# Patient Record
Sex: Female | Born: 1935 | Race: White | Hispanic: No | Smoking: Never smoker
Health system: Southern US, Community
[De-identification: ages and names within clinical notes are randomized; demographics above are authoritative.]

## PROBLEM LIST (undated history)

## (undated) DIAGNOSIS — M48061 Spinal stenosis, lumbar region without neurogenic claudication: Secondary | ICD-10-CM

## (undated) DIAGNOSIS — R32 Unspecified urinary incontinence: Secondary | ICD-10-CM

## (undated) DIAGNOSIS — M1990 Unspecified osteoarthritis, unspecified site: Secondary | ICD-10-CM

## (undated) DIAGNOSIS — R413 Other amnesia: Secondary | ICD-10-CM

## (undated) DIAGNOSIS — D649 Anemia, unspecified: Secondary | ICD-10-CM

## (undated) DIAGNOSIS — G309 Alzheimer's disease, unspecified: Secondary | ICD-10-CM

## (undated) DIAGNOSIS — R269 Unspecified abnormalities of gait and mobility: Secondary | ICD-10-CM

## (undated) DIAGNOSIS — I69359 Hemiplegia and hemiparesis following cerebral infarction affecting unspecified side: Secondary | ICD-10-CM

## (undated) DIAGNOSIS — F0280 Dementia in other diseases classified elsewhere without behavioral disturbance: Secondary | ICD-10-CM

## (undated) DIAGNOSIS — I10 Essential (primary) hypertension: Secondary | ICD-10-CM

## (undated) DIAGNOSIS — M47817 Spondylosis without myelopathy or radiculopathy, lumbosacral region: Secondary | ICD-10-CM

## (undated) DIAGNOSIS — I69398 Other sequelae of cerebral infarction: Secondary | ICD-10-CM

## (undated) DIAGNOSIS — I639 Cerebral infarction, unspecified: Secondary | ICD-10-CM

## (undated) DIAGNOSIS — E785 Hyperlipidemia, unspecified: Secondary | ICD-10-CM

## (undated) HISTORY — DX: Essential (primary) hypertension: I10

## (undated) HISTORY — DX: Hyperlipidemia, unspecified: E78.5

## (undated) HISTORY — DX: Other amnesia: R41.3

## (undated) HISTORY — DX: Spondylosis without myelopathy or radiculopathy, lumbosacral region: M47.817

## (undated) HISTORY — DX: Other sequelae of cerebral infarction: I69.398

## (undated) HISTORY — DX: Cerebral infarction, unspecified: I63.9

## (undated) HISTORY — DX: Unspecified urinary incontinence: R32

## (undated) HISTORY — DX: Unspecified abnormalities of gait and mobility: R26.9

## (undated) HISTORY — DX: Hemiplegia and hemiparesis following cerebral infarction affecting unspecified side: I69.359

## (undated) HISTORY — PX: CATARACT EXTRACTION W/ INTRAOCULAR LENS  IMPLANT, BILATERAL: SHX1307

## (undated) HISTORY — PX: TUBAL LIGATION: SHX77

---

## 1998-12-18 DIAGNOSIS — I639 Cerebral infarction, unspecified: Secondary | ICD-10-CM

## 1998-12-18 HISTORY — DX: Cerebral infarction, unspecified: I63.9

## 2012-08-30 ENCOUNTER — Ambulatory Visit
Admission: RE | Admit: 2012-08-30 | Discharge: 2012-08-30 | Disposition: A | Discharge status: Home / Self Care | Payer: Medicare Other | Source: Ambulatory Visit | Attending: Family Medicine | Admitting: Family Medicine

## 2012-08-30 ENCOUNTER — Other Ambulatory Visit: Payer: Self-pay | Admitting: Family Medicine

## 2012-08-30 DIAGNOSIS — M25552 Pain in left hip: Secondary | ICD-10-CM

## 2012-08-30 DIAGNOSIS — R079 Chest pain, unspecified: Secondary | ICD-10-CM

## 2012-09-20 ENCOUNTER — Ambulatory Visit: Payer: Medicare Other | Attending: Family Medicine

## 2012-09-20 ENCOUNTER — Ambulatory Visit: Payer: Medicare Other | Admitting: Physical Therapy

## 2012-09-20 DIAGNOSIS — R262 Difficulty in walking, not elsewhere classified: Secondary | ICD-10-CM | POA: Insufficient documentation

## 2012-09-20 DIAGNOSIS — M6281 Muscle weakness (generalized): Secondary | ICD-10-CM | POA: Insufficient documentation

## 2012-09-20 DIAGNOSIS — IMO0001 Reserved for inherently not codable concepts without codable children: Secondary | ICD-10-CM | POA: Insufficient documentation

## 2012-09-20 DIAGNOSIS — R269 Unspecified abnormalities of gait and mobility: Secondary | ICD-10-CM | POA: Insufficient documentation

## 2012-09-20 DIAGNOSIS — Z9181 History of falling: Secondary | ICD-10-CM | POA: Insufficient documentation

## 2012-09-25 ENCOUNTER — Ambulatory Visit: Payer: Medicare Other | Admitting: Physical Therapy

## 2012-09-26 ENCOUNTER — Ambulatory Visit: Payer: Medicare Other

## 2012-10-02 ENCOUNTER — Ambulatory Visit: Payer: Medicare Other | Admitting: Physical Therapy

## 2012-10-04 ENCOUNTER — Ambulatory Visit: Payer: Medicare Other | Admitting: Physical Therapy

## 2012-10-09 ENCOUNTER — Ambulatory Visit: Payer: Medicare Other | Admitting: Physical Therapy

## 2012-10-12 ENCOUNTER — Ambulatory Visit: Payer: Medicare Other | Admitting: Physical Therapy

## 2012-10-15 ENCOUNTER — Ambulatory Visit: Payer: Medicare Other

## 2012-10-17 ENCOUNTER — Encounter: Payer: Self-pay | Admitting: Neurology

## 2012-10-17 ENCOUNTER — Ambulatory Visit (INDEPENDENT_AMBULATORY_CARE_PROVIDER_SITE_OTHER): Payer: Medicare Other | Admitting: Neurology

## 2012-10-17 ENCOUNTER — Ambulatory Visit: Payer: Medicare Other | Attending: Family Medicine

## 2012-10-17 VITALS — BP 126/63 | HR 72 | Ht 63.75 in | Wt 128.0 lb

## 2012-10-17 DIAGNOSIS — R413 Other amnesia: Secondary | ICD-10-CM

## 2012-10-17 DIAGNOSIS — R269 Unspecified abnormalities of gait and mobility: Secondary | ICD-10-CM | POA: Insufficient documentation

## 2012-10-17 DIAGNOSIS — Z9181 History of falling: Secondary | ICD-10-CM | POA: Insufficient documentation

## 2012-10-17 DIAGNOSIS — IMO0001 Reserved for inherently not codable concepts without codable children: Secondary | ICD-10-CM | POA: Insufficient documentation

## 2012-10-17 DIAGNOSIS — M6281 Muscle weakness (generalized): Secondary | ICD-10-CM | POA: Insufficient documentation

## 2012-10-17 DIAGNOSIS — R262 Difficulty in walking, not elsewhere classified: Secondary | ICD-10-CM | POA: Insufficient documentation

## 2012-10-17 HISTORY — DX: Other amnesia: R41.3

## 2012-10-17 HISTORY — DX: Unspecified abnormalities of gait and mobility: R26.9

## 2012-10-17 NOTE — Progress Notes (Signed)
Reason for visit: Memory disturbance  Tracey [name] is a 79 y.o. female  History of present illness:  Tracey [name] is an 79 year old right-handed white female with a history of a progressive memory disturbance over the last 12-18 months. The patient has been living in [place], but she has come down to live with her daughter in May of 2014. The daughter last visited her mother in November of 2013. At that time, she was noted to have some mild memory problems, and some difficulty with a wide-based shuffling type gait. The memory issues have worsened over time, and the gait disturbance has also become more prominent. The patient has undergone physical therapy in the [place] area, but this has offered minimal benefit. The patient also reports a long-standing history of bladder control problems and incontinence. It is not clear that this has worsened, but it has certainly remained a problem. The patient has had an occasional fall. The patient was told to use a cane for ambulation, but she does not wish to do so. The patient denies headaches, or neck pain. The patient does have some low back pain and left hip pain. The patient reports degenerative arthritis affecting the right knee which impairs her ability to walk as well. The patient was driving a car, handling her appointments, and finances without problems prior to her transition to [facility] in May of 2014. The patient has had blood work done to include a vitamin B12 level and a thyroid profile which were unremarkable. The patient was taken off of her statin medication. The patient has a history of cerebrovascular disease, and she has had some mild left-sided weakness for several years following a stroke.  Past Medical History  Diagnosis Date  . Memory deficit 10/17/2012  . Abnormality of gait 10/17/2012  . Stroke     Mild left hemiparesis  . Urinary incontinence   . Lumbosacral spondylosis   . Hypertension   . Dyslipidemia     Past Surgical  History  Procedure Laterality Date  . Tubal ligation Bilateral   . Cataract extraction Bilateral     Family History  Problem Relation Age of Onset  . Parkinsonism Brother   . Dementia Brother   . Heart disease Brother     Social history:  reports that she has never smoked. She does not have any smokeless tobacco history on file. She reports that  drinks alcohol. She reports that she does not use illicit drugs.  Medications:  No current outpatient prescriptions on file prior to visit.   No current facility-administered medications on file prior to visit.    Allergies: No Known Allergies  ROS:  Out of a complete 14 system review of symptoms, the patient complains only of the following symptoms, and all other reviewed systems are negative.  Constipation Joint pain, muscle cramps Memory loss, gait disturbance Urinary incontinence  Blood pressure 126/63, pulse 72, height 5' 3.75" (1.619 m), weight 128 lb (58.06 kg).  Physical Exam  General: The patient is alert and cooperative at the time of the examination.  Head: Pupils are equal, round, and reactive to light. Discs are flat bilaterally.  Neck: The neck is supple, no carotid bruits are noted.  Respiratory: The respiratory examination is clear.  Cardiovascular: The cardiovascular examination reveals a regular rate and rhythm, no obvious murmurs or rubs are noted.  Skin: Extremities are without significant edema.  Neurologic Exam  Mental status: Mini-Mental status examination done today shows a total score of 21/30. The patient is  able to name 9 animals in 60 seconds.  Cranial nerves: Facial symmetry is present. There is good sensation of the face to pinprick and soft touch bilaterally. The strength of the facial muscles and the muscles to head turning and shoulder shrug are normal bilaterally. Speech is well enunciated, no aphasia or dysarthria is noted. Extraocular movements are full. Visual fields are full.  Motor:  The motor testing reveals 5 over 5 strength of all 4 extremities. Good symmetric motor tone is noted throughout.  Sensory: Sensory testing is intact to pinprick, soft touch, vibration sensation, and position sense on all 4 extremities, with the exception that position sense is reduced in both feet. No evidence of extinction is noted.  Coordination: Cerebellar testing reveals good finger-nose-finger and heel-to-shin bilaterally.  Gait and station: Gait is wide-based, unsteady. Tandem gait is apraxic and unsteady. Romberg is negative. No drift is seen.  Reflexes: Deep tendon reflexes are symmetric and normal bilaterally. Toes are downgoing bilaterally.   Assessment/Plan:  1. Memory disturbance  2. Gait disturbance  3. Urinary incontinence  4. Cerebrovascular disease  The patient has had a significant change in her cognitive functioning level, and her ability to ambulate within the last 8 months. The patient will need to be evaluated for possible normal pressure hydrocephalus or cerebrovascular disease. The patient will undergo MRI evaluation of the brain. Further evaluation may be done upon the results of this study. The patient otherwise will followup in 3 or 4 months.  Marlan Palau MD 10/17/2012 9:11 PM  [facility] [address] [place], [place] [address]  Phone [phone] Fax [phone]

## 2012-10-22 ENCOUNTER — Telehealth: Payer: Self-pay | Admitting: Neurology

## 2012-10-22 ENCOUNTER — Ambulatory Visit: Payer: Medicare Other

## 2012-10-24 ENCOUNTER — Ambulatory Visit: Payer: Medicare Other

## 2012-10-24 ENCOUNTER — Ambulatory Visit
Admission: RE | Admit: 2012-10-24 | Discharge: 2012-10-24 | Disposition: A | Discharge status: Home / Self Care | Payer: Medicare Other | Source: Ambulatory Visit | Attending: Neurology | Admitting: Neurology

## 2012-10-24 DIAGNOSIS — R413 Other amnesia: Secondary | ICD-10-CM

## 2012-10-24 DIAGNOSIS — R269 Unspecified abnormalities of gait and mobility: Secondary | ICD-10-CM

## 2012-10-24 NOTE — Telephone Encounter (Signed)
I spoke to daughter and told her I would check with Luster Landsberg about when patient is scheduled.  Spoke with Renee and patient was scheduled today.

## 2012-10-25 ENCOUNTER — Telehealth: Payer: Self-pay | Admitting: Neurology

## 2012-10-25 MED ORDER — DONEPEZIL HCL 5 MG PO TABS: 5.0000 mg | ORAL_TABLET | Freq: Every day | ORAL | Status: DC

## 2012-10-25 NOTE — Telephone Encounter (Signed)
I spoke to the patient's daughter [name] numerous times about patient getting a MRI brain ASAP.  She feels that her mother is declining and is very concerned about her condition so she wanted MRI done right away and was willing to pay out of pocket.  The MRI was done on 10-24-12 and daughter is now calling again for results because she does not wait until after the weekend.  I will forward to doctor to contact daughter when results are ready.

## 2012-10-25 NOTE — Telephone Encounter (Signed)
I called the daughter. MRI the brain has not yet been read out, but by my reading, this appears to show severe atrophy and a moderate to severe level of small vessel ischemic changes. There is ventriculomegaly, but this appears to be hydrocephalus ex vacuo. If any differences in the final report are present, I will call the daughter back. We will get the patient started on Aricept and followup in about 3 months. I am not sure that the patient will be able to go back home and lives alone. The dementia issues likely to progress over time.

## 2012-10-25 NOTE — Telephone Encounter (Signed)
I spoke with daughter and she wanted to know if they should make an appointment for patient to come in and doctor to explain things to patient.  Also asked if there were senior resources she could turn to for help.  I told her [facility] might be a place to consider, but she should think about what she wants to do and let us know what she decides.

## 2012-11-13 ENCOUNTER — Ambulatory Visit: Payer: Medicare Other | Admitting: Nurse Practitioner

## 2014-04-24 ENCOUNTER — Inpatient Hospital Stay (HOSPITAL_COMMUNITY)
Admission: EM | Admit: 2014-04-24 | Discharge: 2014-04-25 | DRG: 065 | Disposition: A | Discharge status: Home / Self Care | Payer: Medicare Other | Attending: Internal Medicine | Admitting: Internal Medicine

## 2014-04-24 ENCOUNTER — Inpatient Hospital Stay (HOSPITAL_COMMUNITY): Payer: Medicare Other

## 2014-04-24 ENCOUNTER — Encounter (HOSPITAL_COMMUNITY): Payer: Self-pay

## 2014-04-24 ENCOUNTER — Emergency Department (HOSPITAL_COMMUNITY): Payer: Medicare Other

## 2014-04-24 DIAGNOSIS — R531 Weakness: Secondary | ICD-10-CM | POA: Diagnosis present

## 2014-04-24 DIAGNOSIS — I10 Essential (primary) hypertension: Secondary | ICD-10-CM | POA: Diagnosis present

## 2014-04-24 DIAGNOSIS — Z7982 Long term (current) use of aspirin: Secondary | ICD-10-CM

## 2014-04-24 DIAGNOSIS — Z66 Do not resuscitate: Secondary | ICD-10-CM | POA: Diagnosis present

## 2014-04-24 DIAGNOSIS — R269 Unspecified abnormalities of gait and mobility: Secondary | ICD-10-CM | POA: Diagnosis present

## 2014-04-24 DIAGNOSIS — F0280 Dementia in other diseases classified elsewhere without behavioral disturbance: Secondary | ICD-10-CM | POA: Diagnosis present

## 2014-04-24 DIAGNOSIS — G309 Alzheimer's disease, unspecified: Secondary | ICD-10-CM | POA: Diagnosis present

## 2014-04-24 DIAGNOSIS — R32 Unspecified urinary incontinence: Secondary | ICD-10-CM | POA: Diagnosis present

## 2014-04-24 DIAGNOSIS — I639 Cerebral infarction, unspecified: Principal | ICD-10-CM | POA: Diagnosis present

## 2014-04-24 DIAGNOSIS — R05 Cough: Secondary | ICD-10-CM

## 2014-04-24 DIAGNOSIS — I69354 Hemiplegia and hemiparesis following cerebral infarction affecting left non-dominant side: Secondary | ICD-10-CM | POA: Diagnosis not present

## 2014-04-24 DIAGNOSIS — R059 Cough, unspecified: Secondary | ICD-10-CM

## 2014-04-24 DIAGNOSIS — I6340 Cerebral infarction due to embolism of unspecified cerebral artery: Secondary | ICD-10-CM | POA: Insufficient documentation

## 2014-04-24 DIAGNOSIS — E785 Hyperlipidemia, unspecified: Secondary | ICD-10-CM | POA: Insufficient documentation

## 2014-04-24 DIAGNOSIS — G3183 Dementia with Lewy bodies: Secondary | ICD-10-CM

## 2014-04-24 DIAGNOSIS — M6289 Other specified disorders of muscle: Secondary | ICD-10-CM

## 2014-04-24 DIAGNOSIS — M47817 Spondylosis without myelopathy or radiculopathy, lumbosacral region: Secondary | ICD-10-CM | POA: Diagnosis present

## 2014-04-24 HISTORY — DX: Dementia in other diseases classified elsewhere, unspecified severity, without behavioral disturbance, psychotic disturbance, mood disturbance, and anxiety: F02.80

## 2014-04-24 HISTORY — DX: Alzheimer's disease, unspecified: G30.9

## 2014-04-24 LAB — URINALYSIS, ROUTINE W REFLEX MICROSCOPIC
Bilirubin Urine: NEGATIVE
Glucose, UA: NEGATIVE mg/dL
HGB URINE DIPSTICK: NEGATIVE
Ketones, ur: NEGATIVE mg/dL
LEUKOCYTES UA: NEGATIVE
NITRITE: NEGATIVE
PROTEIN: NEGATIVE mg/dL
Specific Gravity, Urine: 1.014 (ref 1.005–1.030)
UROBILINOGEN UA: 0.2 mg/dL (ref 0.0–1.0)
pH: 5.5 (ref 5.0–8.0)

## 2014-04-24 LAB — COMPREHENSIVE METABOLIC PANEL
ALT: 16 U/L (ref 0–35)
ANION GAP: 9 (ref 5–15)
AST: 25 U/L (ref 0–37)
Albumin: 3.9 g/dL (ref 3.5–5.2)
Alkaline Phosphatase: 57 U/L (ref 39–117)
BUN: 13 mg/dL (ref 6–23)
CALCIUM: 9.7 mg/dL (ref 8.4–10.5)
CHLORIDE: 105 meq/L (ref 96–112)
CO2: 25 mmol/L (ref 19–32)
Creatinine, Ser: 0.91 mg/dL (ref 0.50–1.10)
GFR, EST AFRICAN AMERICAN: 67 mL/min — AB (ref >90)
GFR, EST NON AFRICAN AMERICAN: 58 mL/min — AB (ref >90)
GLUCOSE: 131 mg/dL — AB (ref 70–99)
POTASSIUM: 4 mmol/L (ref 3.5–5.1)
SODIUM: 139 mmol/L (ref 135–145)
TOTAL PROTEIN: 6.1 g/dL (ref 6.0–8.3)
Total Bilirubin: 0.5 mg/dL (ref 0.3–1.2)

## 2014-04-24 LAB — CBC
HCT: 35.5 % — ABNORMAL LOW (ref 36.0–46.0)
Hemoglobin: 11.7 g/dL — ABNORMAL LOW (ref 12.0–15.0)
MCH: 32.2 pg (ref 26.0–34.0)
MCHC: 33 g/dL (ref 30.0–36.0)
MCV: 97.8 fL (ref 78.0–100.0)
PLATELETS: 246 10*3/uL (ref 150–400)
RBC: 3.63 MIL/uL — ABNORMAL LOW (ref 3.87–5.11)
RDW: 13.1 % (ref 11.5–15.5)
WBC: 6.5 10*3/uL (ref 4.0–10.5)

## 2014-04-24 LAB — DIFFERENTIAL
BASOS ABS: 0 10*3/uL (ref 0.0–0.1)
BASOS PCT: 0 % (ref 0–1)
Eosinophils Absolute: 0.3 10*3/uL (ref 0.0–0.7)
Eosinophils Relative: 4 % (ref 0–5)
Lymphocytes Relative: 16 % (ref 12–46)
Lymphs Abs: 1 10*3/uL (ref 0.7–4.0)
MONOS PCT: 8 % (ref 3–12)
Monocytes Absolute: 0.5 10*3/uL (ref 0.1–1.0)
NEUTROS ABS: 4.7 10*3/uL (ref 1.7–7.7)
NEUTROS PCT: 72 % (ref 43–77)

## 2014-04-24 LAB — I-STAT CHEM 8, ED
BUN: 15 mg/dL (ref 6–23)
CHLORIDE: 105 meq/L (ref 96–112)
Calcium, Ion: 1.21 mmol/L (ref 1.13–1.30)
Creatinine, Ser: 0.8 mg/dL (ref 0.50–1.10)
Glucose, Bld: 132 mg/dL — ABNORMAL HIGH (ref 70–99)
HEMATOCRIT: 38 % (ref 36.0–46.0)
HEMOGLOBIN: 12.9 g/dL (ref 12.0–15.0)
POTASSIUM: 3.9 mmol/L (ref 3.5–5.1)
SODIUM: 140 mmol/L (ref 135–145)
TCO2: 22 mmol/L (ref 0–100)

## 2014-04-24 LAB — ETHANOL: Alcohol, Ethyl (B): <5 mg/dL (ref 0–9)

## 2014-04-24 LAB — APTT: aPTT: 31 seconds (ref 24–37)

## 2014-04-24 LAB — PROTIME-INR
INR: 1.15 (ref 0.00–1.49)
PROTHROMBIN TIME: 14.9 s (ref 11.6–15.2)

## 2014-04-24 LAB — RAPID URINE DRUG SCREEN, HOSP PERFORMED
Amphetamines: NOT DETECTED
BARBITURATES: NOT DETECTED
Benzodiazepines: NOT DETECTED
COCAINE: NOT DETECTED
Opiates: POSITIVE — AB
Tetrahydrocannabinol: NOT DETECTED

## 2014-04-24 LAB — TSH: TSH: 0.6 u[IU]/mL (ref 0.350–4.500)

## 2014-04-24 LAB — I-STAT TROPONIN, ED: Troponin i, poc: 0 ng/mL (ref 0.00–0.08)

## 2014-04-24 MED ORDER — DONEPEZIL HCL 10 MG PO TABS
10.0000 mg | ORAL_TABLET | Freq: Every day | ORAL | Status: DC
  Administered 2014-04-25: 10 mg via ORAL
  Filled 2014-04-24: qty 1

## 2014-04-24 MED ORDER — OMEGA-3-ACID ETHYL ESTERS 1 G PO CAPS
1.0000 g | ORAL_CAPSULE | Freq: Every day | ORAL | Status: DC
  Administered 2014-04-25: 1 g via ORAL
  Filled 2014-04-24: qty 1

## 2014-04-24 MED ORDER — VITAMIN D 1000 UNITS PO TABS
1000.0000 [IU] | ORAL_TABLET | Freq: Every day | ORAL | Status: DC
  Administered 2014-04-25: 1000 [IU] via ORAL
  Filled 2014-04-24: qty 1

## 2014-04-24 MED ORDER — ENOXAPARIN SODIUM 40 MG/0.4ML ~~LOC~~ SOLN
40.0000 mg | SUBCUTANEOUS | Status: DC
Start: 2014-04-24 — End: 2014-04-25
  Administered 2014-04-24: 40 mg via SUBCUTANEOUS
  Filled 2014-04-24: qty 0.4

## 2014-04-24 MED ORDER — SODIUM CHLORIDE 0.9 % IV SOLN: INTRAVENOUS | Status: DC

## 2014-04-24 MED ORDER — ASPIRIN 325 MG PO TABS
325.0000 mg | ORAL_TABLET | Freq: Every day | ORAL | Status: DC
  Administered 2014-04-25: 325 mg via ORAL
  Filled 2014-04-24: qty 1

## 2014-04-24 MED ORDER — DICLOFENAC SODIUM 2 % TD SOLN: 2.0000 | Freq: Every day | TRANSDERMAL | Status: DC | PRN

## 2014-04-24 MED ORDER — CALCIUM CARBONATE-VITAMIN D 500-200 MG-UNIT PO TABS
1.0000 | ORAL_TABLET | Freq: Every day | ORAL | Status: DC
  Administered 2014-04-25: 1 via ORAL
  Filled 2014-04-24: qty 1

## 2014-04-24 MED ORDER — FISH OIL 1000 MG PO CAPS
1000.0000 mg | ORAL_CAPSULE | Freq: Every day | ORAL | Status: DC
  Filled 2014-04-24 (×2): qty 1

## 2014-04-24 MED ORDER — STROKE: EARLY STAGES OF RECOVERY BOOK
Freq: Once | Status: AC
  Administered 2014-04-24: 14:00:00
  Filled 2014-04-24: qty 1

## 2014-04-24 MED ORDER — POLYETHYLENE GLYCOL 3350 17 G PO PACK: 17.0000 g | PACK | Freq: Every day | ORAL | Status: DC | PRN

## 2014-04-24 MED ORDER — ACETAMINOPHEN-CODEINE #3 300-30 MG PO TABS: 1.0000 | ORAL_TABLET | Freq: Three times a day (TID) | ORAL | Status: DC | PRN

## 2014-04-24 MED ORDER — FLUOCINONIDE 0.05 % EX OINT
1.0000 "application " | TOPICAL_OINTMENT | Freq: Two times a day (BID) | CUTANEOUS | Status: DC
  Filled 2014-04-24: qty 15

## 2014-04-24 MED ORDER — SENNOSIDES-DOCUSATE SODIUM 8.6-50 MG PO TABS: 1.0000 | ORAL_TABLET | Freq: Every evening | ORAL | Status: DC | PRN

## 2014-04-24 MED ORDER — ASPIRIN 300 MG RE SUPP
300.0000 mg | Freq: Every day | RECTAL | Status: DC
  Administered 2014-04-24: 300 mg via RECTAL
  Filled 2014-04-24: qty 1

## 2014-04-24 NOTE — ED Notes (Signed)
Pt unable to void on bedpan

## 2014-04-24 NOTE — Progress Notes (Signed)
Report received from Ed, ED RN. Pt coming to 4N26. All questions answered.

## 2014-04-24 NOTE — ED Notes (Signed)
Patient transported to CT 

## 2014-04-24 NOTE — Consult Note (Signed)
Referring Physician: Regalado    Chief Complaint: possible stroke  HPI:                                                                                                                                         Tracey [name] is an 81 y.o. female comes from [facility]. At 3am pt slid down from her bed into the floor trying to go to the bathroom. Pt did not hit head. Staff assisted her back to bed. This morning the patient woke up and felt weak. Pt has hx of left sided deficit from cva several years ago but states that she feels much weaker the last few days on her left side than normal. Pt is not on blood thinners, just takes ASA. Patient has residual left sided weakness from previous stroke but feels this has increased.   Date last known well: Date: 04/24/2014 Time last known well: Time: 03:00 tPA Given: No: minimal symptoms and out of window   Past Medical History  Diagnosis Date  . Memory deficit 10/17/2012  . Abnormality of gait 10/17/2012  . Stroke     Mild left hemiparesis  . Urinary incontinence   . Lumbosacral spondylosis   . Hypertension   . Dyslipidemia   . Spinal stenosis   . Alzheimer disease     Past Surgical History  Procedure Laterality Date  . Tubal ligation Bilateral   . Cataract extraction Bilateral     Family History  Problem Relation Age of Onset  . Parkinsonism Brother   . Dementia Brother   . Heart disease Brother    Social History:  reports that she has never smoked. She does not have any smokeless tobacco history on file. She reports that she drinks about 0.6 oz of alcohol per week. She reports that she does not use illicit drugs.  Allergies:  Allergies  Allergen Reactions  . Alendronate Sodium   . Simvastatin     Medications:                                                                                                                           Prior to Admission:  Prescriptions prior to admission  Medication Sig Dispense Refill  Last Dose  . acetaminophen-codeine (TYLENOL #3) 300-30 MG per tablet Take 1 tablet by mouth every 8 (eight) hours as needed for  moderate pain.   04/24/2014 at Unknown time  . aspirin 81 MG tablet Take 81 mg by mouth daily.   04/24/2014 at Unknown time  . calcium-vitamin D (OSCAL WITH D) 500-200 MG-UNIT per tablet Take 1 tablet by mouth daily with breakfast.   04/24/2014 at Unknown time  . cholecalciferol (VITAMIN D) 1000 UNITS tablet Take 1,000 Units by mouth daily.   04/24/2014 at Unknown time  . Diclofenac Sodium (PENNSAID) 2 % SOLN Place 2 Squirts onto the skin daily as needed (pain).   04/24/2014 at Unknown time  . donepezil (ARICEPT) 5 MG tablet Take 1 tablet (5 mg total) by mouth at bedtime. (Patient taking differently: Take 10 mg by mouth daily. ) 30 tablet 1 04/24/2014 at Unknown time  . fluocinonide ointment (LIDEX) 0.05 % Apply 1 application topically 2 (two) times daily.   04/23/2014 at Unknown time  . Omega-3 Fatty Acids (FISH OIL) 1000 MG CAPS Take 1,000 mg by mouth daily.   04/24/2014 at Unknown time  . polyethylene glycol (MIRALAX / GLYCOLAX) packet Take 17 g by mouth daily as needed for mild constipation.   04/24/2014 at Unknown time  . Diclofenac Sodium (PENNSAID TD) Place onto the skin.     Marland Kitchen. lisinopril (PRINIVIL,ZESTRIL) 2.5 MG tablet Take 2.5 mg by mouth daily.   Taking  . Melatonin 3 MG CAPS Take 3 mg by mouth at bedtime.   Taking  . methocarbamol (ROBAXIN) 750 MG tablet Take 750 mg by mouth 4 (four) times daily.   Taking  . traMADol (ULTRAM) 50 MG tablet Take 50 mg by mouth every 6 (six) hours as needed for pain.   Taking   Scheduled: .  stroke: mapping our early stages of recovery book   Does not apply Once  . aspirin  300 mg Rectal Daily   Or  . aspirin  325 mg Oral Daily  . [START ON 04/25/2014] calcium-vitamin D  1 tablet Oral Q breakfast  . cholecalciferol  1,000 Units Oral Daily  . donepezil  10 mg Oral Daily  . enoxaparin (LOVENOX) injection  40 mg Subcutaneous Q24H  . Fish Oil   1,000 mg Oral Daily  . fluocinonide ointment  1 application Topical BID    ROS:                                                                                                                                       History obtained from unobtainable from patient due to mental status    Neurologic Examination:  Blood pressure 135/109, pulse 73, temperature 98.3 F (36.8 C), temperature source Oral, resp. rate 18, SpO2 97 %.  HEENT-  Normocephalic, no lesions, without obvious abnormality.  Normal external eye and conjunctiva.  Normal TM's bilaterally.  Normal auditory canals and external ears. Normal external nose, mucus membranes and septum.  Normal pharynx. Cardiovascular- regular rate and rhythm, pulses palpable throughout   Lungs- chest clear, no wheezing, rales, normal symmetric air entry Abdomen- soft, non-tender; bowel sounds normal; no masses,  no organomegaly Extremities- no edema and no skin discoloration Lymph-no adenopathy palpable Musculoskeletal-no muscular tenderness noted Skin-warm and dry, no hyperpigmentation, vitiligo, or suspicious lesions  Neurological Examination Mental Status: Alert, not oriented to place, year or month.  Speech fluent without evidence of aphasia.  Able to follow 3 step commands without difficulty. Cranial Nerves: II: Discs flat bilaterally; Visual fields grossly normal, pupils equal, round, reactive to light and accommodation III,IV, VI: ptosis not present, extra-ocular motions intact bilaterally V,VII: smile asymmetric with facial droop on the left, facial light touch sensation decreased on the left VIII: hearing normal bilaterally IX,X: gag reflex present XI: bilateral shoulder shrug XII: midline tongue extension Motor: Right : Upper extremity   5/5    Left:     Upper extremity   4/5  Lower extremity   5/5     Lower extremity   4/5 Tone and  bulk:normal tone throughout; no atrophy noted Sensory: Pinprick and light touch intact throughout but stated to be decreased on the left arm and leg Deep Tendon Reflexes: 2+ and symmetric throughout UE and no KJ or AJ noted Plantars: Right: downgoing   Left: downgoing Cerebellar: normal finger-to-nose,  and normal heel-to-shin test Gait: not tested due to safety       Lab Results: Basic Metabolic Panel:  Recent Labs Lab 04/24/14 0940 04/24/14 1017  NA 139 140  K 4.0 3.9  CL 105 105  CO2 25  --   GLUCOSE 131* 132*  BUN 13 15  CREATININE 0.91 0.80  CALCIUM 9.7  --     Liver Function Tests:  Recent Labs Lab 04/24/14 0940  AST 25  ALT 16  ALKPHOS 57  BILITOT 0.5  PROT 6.1  ALBUMIN 3.9   No results for input(s): LIPASE, AMYLASE in the last 168 hours. No results for input(s): AMMONIA in the last 168 hours.  CBC:  Recent Labs Lab 04/24/14 0940 04/24/14 1017  WBC 6.5  --   NEUTROABS 4.7  --   HGB 11.7* 12.9  HCT 35.5* 38.0  MCV 97.8  --   PLT 246  --     Cardiac Enzymes: No results for input(s): CKTOTAL, CKMB, CKMBINDEX, TROPONINI in the last 168 hours.  Lipid Panel: No results for input(s): CHOL, TRIG, HDL, CHOLHDL, VLDL, LDLCALC in the last 168 hours.  CBG: No results for input(s): GLUCAP in the last 168 hours.  Microbiology: No results found for this or any previous visit.  Coagulation Studies:  Recent Labs  04/24/14 0940  LABPROT 14.9  INR 1.15    Imaging: Ct Head Wo Contrast  04/24/2014   CLINICAL DATA:  81 year old female post fall in bathroom. No history of loss of consciousness provided. Initial encounter.  EXAM: CT HEAD WITHOUT CONTRAST  TECHNIQUE: Contiguous axial images were obtained from the base of the skull through the vertex without intravenous contrast.  COMPARISON:  None.  FINDINGS: No skull fracture.  Fluid level within the right maxillary sinus. Although this may be related to sinus disease,  result of inferior orbital floor  fracture cannot be excluded. If this is of clinical concern, CT of the orbits recommended for further delineation.  Polypoid opacification right sphenoid sinus. Opacification ethmoid sinus air cells bilaterally. Opacification/mucosal thickening right frontal sinus. Mastoid air cells and middle ear cavities are clear bilaterally.  No intracranial hemorrhage.  Remote bilateral frontal lobe infarcts with encephalomalacia. Prominent small vessel disease type changes. No CT evidence of large acute infarct.  Global atrophy. Ventricular prominence probably related to atrophy rather than hydrocephalus.  No intracranial mass lesion noted on this unenhanced exam.  Vascular calcifications.  Globes appear grossly intact.  IMPRESSION: No skull fracture or intracranial hemorrhage.  Fluid level within the right maxillary sinus. Although this may be related to sinus disease, result of inferior orbital floor fracture cannot be excluded. If this is of clinical concern, CT of the orbits recommended for further delineation.  Polypoid opacification right sphenoid sinus. Opacification ethmoid sinus air cells bilaterally. Opacification/mucosal thickening right frontal sinus. Mastoid air cells and middle ear cavities are clear bilaterally.  Remote bilateral frontal lobe infarcts with encephalomalacia. Prominent small vessel disease type changes.  Global atrophy. Ventricular prominence probably related to atrophy rather than hydrocephalus.   Electronically Signed   By: Bridgett Larsson M.D.   On: 04/24/2014 11:13       Assessment and plan discussed with with attending physician and they are in agreement.    Felicie Morn PA-C [facility] [phone]  04/24/2014, 1:55 PM   Assessment: 81 y.o. female with known residual left face, arm and leg weakness on the left.  She presents to ED due to nursing staff and patient noticing increased left sided weakness. Initial CT head shows no acute infarct or bleed.  Exam shows left  sided mild weakness in arm and leg, mild left facial droop and decreased left arm and leg sensation.   Stroke Risk Factors - hyperlipidemia  Recommend: 1) MRI of brain to evaluate for CVA.  IF negative no further work up. If positive recommend stroke work up and stroke team will follow.    Elspeth Cho, DO [facility] [phone]  If 7pm- 7am, please page neurology on call as listed in AMION.

## 2014-04-24 NOTE — ED Provider Notes (Signed)
CSN: [id number]     Arrival date & time 04/24/14  0933 History   First MD Initiated Contact with Patient 04/24/14 [phone]     Chief Complaint  Patient presents with  . Stroke Symptoms     (Consider location/radiation/quality/duration/timing/severity/associated sxs/prior Treatment) HPI Comments: Patient sent to the emergency department for left-sided weakness. Patient was noted this morning to be utilizing her left side much less than usual. Patient does have a history of a stroke with mild left-sided residual symptoms. Patient reports, however, in the last couple of days she has noticed significant weakness in her left arm and leg. Nursing home staff has noticed this this morning and centered to the ER. Patient also reportedly had a near fall overnight. She normally can ambulate without difficulty, last night had difficulty standing and had to be caught and sat down by staff with help. There was no fall.   Past Medical History  Diagnosis Date  . Memory deficit 10/17/2012  . Abnormality of gait 10/17/2012  . Stroke     Mild left hemiparesis  . Urinary incontinence   . Lumbosacral spondylosis   . Hypertension   . Dyslipidemia   . Spinal stenosis   . Alzheimer disease    Past Surgical History  Procedure Laterality Date  . Tubal ligation Bilateral   . Cataract extraction Bilateral    Family History  Problem Relation Age of Onset  . Parkinsonism Brother   . Dementia Brother   . Heart disease Brother    History  Substance Use Topics  . Smoking status: Never Smoker   . Smokeless tobacco: Not on file  . Alcohol Use: 0.6 oz/week    1 Glasses of wine per week     Comment: 1 glass of wine per day   OB History    No data available     Review of Systems  Neurological: Positive for weakness (left side).      Allergies  Alendronate sodium and Simvastatin  Home Medications   Prior to Admission medications   Medication Sig Start Date End Date Taking? Authorizing Provider  aspirin  81 MG tablet Take 81 mg by mouth daily.    Historical Provider, MD  cholecalciferol (VITAMIN D) 1000 UNITS tablet Take 1,000 Units by mouth daily.    Historical Provider, MD  donepezil (ARICEPT) 5 MG tablet Take 1 tablet (5 mg total) by mouth at bedtime. 10/25/12   York Spaniel, MD  lisinopril (PRINIVIL,ZESTRIL) 2.5 MG tablet Take 2.5 mg by mouth daily.    Historical Provider, MD  Melatonin 3 MG CAPS Take 3 mg by mouth at bedtime.    Historical Provider, MD  methocarbamol (ROBAXIN) 750 MG tablet Take 750 mg by mouth 4 (four) times daily.    Historical Provider, MD  traMADol (ULTRAM) 50 MG tablet Take 50 mg by mouth every 6 (six) hours as needed for pain.    Historical Provider, MD   BP 142/45 mmHg  Pulse 65  Temp(Src) 98.3 F (36.8 C) (Oral)  Resp 13  SpO2 98% Physical Exam  Constitutional: She is oriented to person, place, and time. She appears well-developed and well-nourished. No distress.  HENT:  Head: Normocephalic and atraumatic.  Right Ear: Hearing normal.  Left Ear: Hearing normal.  Nose: Nose normal.  Mouth/Throat: Oropharynx is clear and moist and mucous membranes are normal.  Eyes: Conjunctivae and EOM are normal. Pupils are equal, round, and reactive to light.  Neck: Normal range of motion. Neck supple.  Cardiovascular: Regular  rhythm, S1 normal and S2 normal.  Exam reveals no gallop and no friction rub.   No murmur heard. Pulmonary/Chest: Effort normal and breath sounds normal. No respiratory distress. She exhibits no tenderness.  Abdominal: Soft. Normal appearance and bowel sounds are normal. There is no hepatosplenomegaly. There is no tenderness. There is no rebound, no guarding, no tenderness at McBurney's point and negative Murphy's sign. No hernia.  Musculoskeletal: Normal range of motion.  Neurological: She is alert and oriented to person, place, and time. She has normal strength. A cranial nerve deficit is present. No sensory deficit. She exhibits abnormal muscle  tone. Coordination abnormal. GCS eye subscore is 4. GCS verbal subscore is 5. GCS motor subscore is 6.  LUE strength 2+/5 RUE strength 5/5  Cannot perform finger to nose on left because she cannot lift arm high enough against gravity Significant pronator drift on left  Slight left facial droop, tongue deviated to left  Skin: Skin is warm, dry and intact. No rash noted. No cyanosis.  Psychiatric: She has a normal mood and affect. Her speech is normal and behavior is normal. Thought content normal.  Nursing note and vitals reviewed.   ED Course  Procedures (including critical care time) Labs Review Labs Reviewed  CBC - Abnormal; Notable for the following:    RBC 3.63 (*)    Hemoglobin 11.7 (*)    HCT 35.5 (*)    All other components within normal limits  COMPREHENSIVE METABOLIC PANEL - Abnormal; Notable for the following:    Glucose, Bld 131 (*)    GFR calc non Af Amer 58 (*)    GFR calc Af Amer 67 (*)    All other components within normal limits  I-STAT CHEM 8, ED - Abnormal; Notable for the following:    Glucose, Bld 132 (*)    All other components within normal limits  PROTIME-INR  APTT  DIFFERENTIAL  ETHANOL  URINE RAPID DRUG SCREEN (HOSP PERFORMED)  URINALYSIS, ROUTINE W REFLEX MICROSCOPIC  I-STAT TROPOININ, ED  I-STAT TROPOININ, ED    Imaging Review Ct Head Wo Contrast  04/24/2014   CLINICAL DATA:  81 year old female post fall in bathroom. No history of loss of consciousness provided. Initial encounter.  EXAM: CT HEAD WITHOUT CONTRAST  TECHNIQUE: Contiguous axial images were obtained from the base of the skull through the vertex without intravenous contrast.  COMPARISON:  None.  FINDINGS: No skull fracture.  Fluid level within the right maxillary sinus. Although this may be related to sinus disease, result of inferior orbital floor fracture cannot be excluded. If this is of clinical concern, CT of the orbits recommended for further delineation.  Polypoid opacification  right sphenoid sinus. Opacification ethmoid sinus air cells bilaterally. Opacification/mucosal thickening right frontal sinus. Mastoid air cells and middle ear cavities are clear bilaterally.  No intracranial hemorrhage.  Remote bilateral frontal lobe infarcts with encephalomalacia. Prominent small vessel disease type changes. No CT evidence of large acute infarct.  Global atrophy. Ventricular prominence probably related to atrophy rather than hydrocephalus.  No intracranial mass lesion noted on this unenhanced exam.  Vascular calcifications.  Globes appear grossly intact.  IMPRESSION: No skull fracture or intracranial hemorrhage.  Fluid level within the right maxillary sinus. Although this may be related to sinus disease, result of inferior orbital floor fracture cannot be excluded. If this is of clinical concern, CT of the orbits recommended for further delineation.  Polypoid opacification right sphenoid sinus. Opacification ethmoid sinus air cells bilaterally. Opacification/mucosal thickening right frontal  sinus. Mastoid air cells and middle ear cavities are clear bilaterally.  Remote bilateral frontal lobe infarcts with encephalomalacia. Prominent small vessel disease type changes.  Global atrophy. Ventricular prominence probably related to atrophy rather than hydrocephalus.   Electronically Signed   By: Bridgett Larsson M.D.   On: 04/24/2014 11:13     EKG Interpretation   Date/Time:  Thursday April 24 2014 09:44:08 EST Ventricular Rate:  66 PR Interval:    QRS Duration: 96 QT Interval:  403 QTC Calculation: 422 R Axis:   -16 Text Interpretation:  Junctional rhythm Borderline left axis deviation  Anterior infarct, old No previous tracing Confirmed by Traevion Poehler  MD,  Mauriah Mcmillen [phone]) on 04/24/2014 9:54:13 AM      MDM   Final diagnoses:  None   CVA  Patient presents to the ER from assisted living. Patient was noted to have left-sided weakness this morning. Patient does report a previous CVA  with minimal left-sided residual deficits. She is normally ambulatory without difficulty. Examination today reveals significant left-sided deficit. This is consistent with new CVA, although CT scan does not show any new abnormality. She will require hospitalization for further workup. Neurology consult requested. Will admit to hospitalist.    Gilda Crease, MD 04/24/14 1146

## 2014-04-24 NOTE — H&P (Signed)
[facility] History and Physical  Tracey [name] JXB:[id number] DOB: 1930-11-05 DOA: 04/24/2014  Referring physician: Dr Oletta Cohn PCP: Darrow Bussing, MD   Chief Complaint: worsening left side weakness.   HPI: Tracey [name] is a 81 y.o. female with PMH significant for Stroke with resultant left hemiparesis, Alzheimer, HTN who presents complaining of worsening left side weakness since 3 am. Patient was having problems standing up last night, difficulty going to the bathroom.  She was weak last night, she almost fall. She was caught and sat down by staff. Her daughter has notice slurred speech also. Patient denies chest pain, dyspnea, abdominal pain, dysuria, or diarrhea.  Evaluation in the ED; CT head negative for acute bleed, brain atrophy and old stroke.    Review of Systems:  Negative, except as per HPI.   Past Medical History  Diagnosis Date  . Memory deficit 10/17/2012  . Abnormality of gait 10/17/2012  . Stroke     Mild left hemiparesis  . Urinary incontinence   . Lumbosacral spondylosis   . Hypertension   . Dyslipidemia   . Spinal stenosis   . Alzheimer disease    Past Surgical History  Procedure Laterality Date  . Tubal ligation Bilateral   . Cataract extraction Bilateral    Social History:  reports that she has never smoked. She does not have any smokeless tobacco history on file. She reports that she drinks about 0.6 oz of alcohol per week. She reports that she does not use illicit drugs.  Allergies  Allergen Reactions  . Alendronate Sodium   . Simvastatin     Family History  Problem Relation Age of Onset  . Parkinsonism Brother   . Dementia Brother   . Heart disease Brother      Prior to Admission medications   Medication Sig Start Date End Date Taking? Authorizing Provider  acetaminophen-codeine (TYLENOL #3) 300-30 MG per tablet Take 1 tablet by mouth every 8 (eight) hours as needed for moderate pain.   Yes Historical Provider, MD  aspirin 81 MG tablet Take 81 mg  by mouth daily.   Yes Historical Provider, MD  calcium-vitamin D (OSCAL WITH D) 500-200 MG-UNIT per tablet Take 1 tablet by mouth daily with breakfast.   Yes Historical Provider, MD  Diclofenac Sodium (PENNSAID TD) Place onto the skin.   Yes Historical Provider, MD  Diclofenac Sodium (PENNSAID) 2 % SOLN Place 2 Squirts onto the skin daily as needed (pain).   Yes Historical Provider, MD  donepezil (ARICEPT) 5 MG tablet Take 1 tablet (5 mg total) by mouth at bedtime. Patient taking differently: Take 10 mg by mouth at bedtime.  10/25/12  Yes York Spaniel, MD  fluocinonide ointment (LIDEX) 0.05 % Apply 1 application topically 2 (two) times daily.   Yes Historical Provider, MD  Omega-3 Fatty Acids (FISH OIL) 1000 MG CAPS Take 1,000 mg by mouth daily.   Yes Historical Provider, MD  cholecalciferol (VITAMIN D) 1000 UNITS tablet Take 1,000 Units by mouth daily.    Historical Provider, MD  lisinopril (PRINIVIL,ZESTRIL) 2.5 MG tablet Take 2.5 mg by mouth daily.    Historical Provider, MD  Melatonin 3 MG CAPS Take 3 mg by mouth at bedtime.    Historical Provider, MD  methocarbamol (ROBAXIN) 750 MG tablet Take 750 mg by mouth 4 (four) times daily.    Historical Provider, MD  traMADol (ULTRAM) 50 MG tablet Take 50 mg by mouth every 6 (six) hours as needed for pain.    Historical Provider, MD  Physical Exam: Filed Vitals:   04/24/14 1059 04/24/14 1115 04/24/14 1130 04/24/14 1200  BP: 134/61 139/105 142/45 103/79  Pulse: 71 74 65 60  Temp:      TempSrc:      Resp: 15 19 13 14   SpO2: 100% 96% 98% 99%    Wt Readings from Last 3 Encounters:  10/17/12 58.06 kg (128 lb)    General:  Appears calm and comfortable Eyes: PERRL, normal lids, irises & conjunctiva ENT: grossly normal hearing, lips & tongue Neck: no LAD, masses or thyromegaly Cardiovascular: RRR, no m/r/g. No LE edema. Telemetry: SR, no arrhythmias  Respiratory: CTA bilaterally, no w/r/r. Normal respiratory effort. Abdomen: soft,  ntnd Skin: no rash or induration seen on limited exam Musculoskeletal: grossly normal tone BUE/BLE Psychiatric: grossly normal mood and affect, speech fluent and appropriate Neurologic: Alert and oriented, speech slurred at times, new per daughter. Left side 3-4/5, right side 5/5.           Labs on Admission:  Basic Metabolic Panel:  Recent Labs Lab 04/24/14 0940 04/24/14 1017  NA 139 140  K 4.0 3.9  CL 105 105  CO2 25  --   GLUCOSE 131* 132*  BUN 13 15  CREATININE 0.91 0.80  CALCIUM 9.7  --    Liver Function Tests:  Recent Labs Lab 04/24/14 0940  AST 25  ALT 16  ALKPHOS 57  BILITOT 0.5  PROT 6.1  ALBUMIN 3.9   No results for input(s): LIPASE, AMYLASE in the last 168 hours. No results for input(s): AMMONIA in the last 168 hours. CBC:  Recent Labs Lab 04/24/14 0940 04/24/14 1017  WBC 6.5  --   NEUTROABS 4.7  --   HGB 11.7* 12.9  HCT 35.5* 38.0  MCV 97.8  --   PLT 246  --    Cardiac Enzymes: No results for input(s): CKTOTAL, CKMB, CKMBINDEX, TROPONINI in the last 168 hours.  BNP (last 3 results) No results for input(s): PROBNP in the last 8760 hours. CBG: No results for input(s): GLUCAP in the last 168 hours.  Radiological Exams on Admission: Ct Head Wo Contrast  04/24/2014   CLINICAL DATA:  81 year old female post fall in bathroom. No history of loss of consciousness provided. Initial encounter.  EXAM: CT HEAD WITHOUT CONTRAST  TECHNIQUE: Contiguous axial images were obtained from the base of the skull through the vertex without intravenous contrast.  COMPARISON:  None.  FINDINGS: No skull fracture.  Fluid level within the right maxillary sinus. Although this may be related to sinus disease, result of inferior orbital floor fracture cannot be excluded. If this is of clinical concern, CT of the orbits recommended for further delineation.  Polypoid opacification right sphenoid sinus. Opacification ethmoid sinus air cells bilaterally. Opacification/mucosal  thickening right frontal sinus. Mastoid air cells and middle ear cavities are clear bilaterally.  No intracranial hemorrhage.  Remote bilateral frontal lobe infarcts with encephalomalacia. Prominent small vessel disease type changes. No CT evidence of large acute infarct.  Global atrophy. Ventricular prominence probably related to atrophy rather than hydrocephalus.  No intracranial mass lesion noted on this unenhanced exam.  Vascular calcifications.  Globes appear grossly intact.  IMPRESSION: No skull fracture or intracranial hemorrhage.  Fluid level within the right maxillary sinus. Although this may be related to sinus disease, result of inferior orbital floor fracture cannot be excluded. If this is of clinical concern, CT of the orbits recommended for further delineation.  Polypoid opacification right sphenoid sinus. Opacification ethmoid sinus air  cells bilaterally. Opacification/mucosal thickening right frontal sinus. Mastoid air cells and middle ear cavities are clear bilaterally.  Remote bilateral frontal lobe infarcts with encephalomalacia. Prominent small vessel disease type changes.  Global atrophy. Ventricular prominence probably related to atrophy rather than hydrocephalus.   Electronically Signed   By: Bridgett Larsson M.D.   On: 04/24/2014 11:13    EKG: Independently reviewed. Junctional rhythm, repeat EKG in am.   Assessment/Plan Active Problems:   CVA (cerebral infarction)   1-Worsening left side weakness and slurred speech.  Admit to telemetry. Rule oust stroke.  Frequent neuro check. Permissive HTN.  Check MRI, Lipid panel.  If MRI positive will need to order ECHO and doppler.  Will also due work up for infection, UA negative for infection, chest x ray ordered. . Check TSH, B 12.  Continue with aspirin.  Fail swallow evaluation in the ED. Speech evaluation ordered.  Repeat EKG in am.  2-HTN hold lisinopril. Permissive HTN.   3-Dementia; continue Aricept.   Code Status: DNR DVT  Prophylaxis:lovenox.  Family Communication: Care discussed with daughter who was at bedside.  Disposition Plan: expect 2 to 3 days inpatient.   Time spent: 75 minutes.   Hartley Barefoot A [facility] Pager [phone]

## 2014-04-24 NOTE — ED Notes (Signed)
Per EMS, pt comes from [facility]. @ 3am pt slid down from her bed into the floor trying to go to the bathroom. Pt did not hit head. Staff assisted her back to bed. This morning the patient woke up and felt weak. Pt has hx of left sided deficit from cva several years ago but states that she feels much weaker the last few days on her left side than normal. Pt is not on blood thinners, just takes ASA. Upon physician examination, left sided weakness is noted. Assisted living staff states that this pt is normally ambulatory by herself with a walker.

## 2014-04-25 ENCOUNTER — Other Ambulatory Visit: Payer: Self-pay | Admitting: Neurology

## 2014-04-25 DIAGNOSIS — E785 Hyperlipidemia, unspecified: Secondary | ICD-10-CM

## 2014-04-25 DIAGNOSIS — G3183 Dementia with Lewy bodies: Secondary | ICD-10-CM | POA: Insufficient documentation

## 2014-04-25 DIAGNOSIS — I6340 Cerebral infarction due to embolism of unspecified cerebral artery: Secondary | ICD-10-CM | POA: Insufficient documentation

## 2014-04-25 DIAGNOSIS — R413 Other amnesia: Secondary | ICD-10-CM

## 2014-04-25 DIAGNOSIS — F0280 Dementia in other diseases classified elsewhere without behavioral disturbance: Secondary | ICD-10-CM

## 2014-04-25 DIAGNOSIS — I63419 Cerebral infarction due to embolism of unspecified middle cerebral artery: Secondary | ICD-10-CM

## 2014-04-25 DIAGNOSIS — I059 Rheumatic mitral valve disease, unspecified: Secondary | ICD-10-CM

## 2014-04-25 LAB — VITAMIN B12: Vitamin B-12: 457 pg/mL (ref 211–911)

## 2014-04-25 LAB — LIPID PANEL
Cholesterol: 199 mg/dL (ref 0–200)
HDL: 75 mg/dL (ref >39)
LDL Cholesterol: 100 mg/dL — ABNORMAL HIGH (ref 0–99)
Total CHOL/HDL Ratio: 2.7 RATIO
Triglycerides: 122 mg/dL (ref <150)
VLDL: 24 mg/dL (ref 0–40)

## 2014-04-25 LAB — HEMOGLOBIN A1C
Hgb A1c MFr Bld: 5.8 % — ABNORMAL HIGH (ref <5.7)
Mean Plasma Glucose: 120 mg/dL — ABNORMAL HIGH (ref <117)

## 2014-04-25 NOTE — Evaluation (Signed)
Physical Therapy Evaluation Patient Details Name: Tracey [name] Ridgley MRN: [id number] DOB: 06/06/1930 Today's Date: 04/25/2014   History of Present Illness  Patient is a 81 y/o female with PMH for CVA with resultant left hemiparesis, Alzheimer's and HTN who presents complaining of worsening left side weakness and slurred speech (per daughter). CT head (-) for acute infarct. MRI- Remote bilateral middle cerebral artery territory infarcts. Work up pending.    Clinical Impression  Patient presents with functional limitations due to deficits listed in PT problem list (see below). Pt with generalized weakness throughout BLEs, balance deficits and impaired safety awareness impacting safe mobility. Pt requires Min A for transfers and gait today to prevent LOB. Not sure of pt's level of support/assist at ALF however due to safety concerns and pt being high fall risk, recommend St SNF at discharge as pt will need more hands on assist. If ALF able to provide necessary assist, safe to discharge back there. Pt would benefit from skilled PT to improve overall functional mobility and safety.    Follow Up Recommendations SNF;Supervision/Assistance - 24 hour    Equipment Recommendations  None recommended by PT    Recommendations for Other Services       Precautions / Restrictions Precautions Precautions: Fall Restrictions Weight Bearing Restrictions: No      Mobility  Bed Mobility Overal bed mobility: Needs Assistance Bed Mobility: Supine to Sit     Supine to sit: Min guard;HOB elevated     General bed mobility comments: Min guard for safety. Increased time. Use of rails for support.  Transfers Overall transfer level: Needs assistance Equipment used: Rolling walker (2 wheeled) Transfers: Sit to/from Stand Sit to Stand: Min assist         General transfer comment: Min A to rise from EOB x1, from toilet x1 with posterior lean upon standing. Cues for anterior translation and hand placement. Multiple  attempts to stand without assist and unable.  Ambulation/Gait Ambulation/Gait assistance: Min assist Ambulation Distance (Feet): 12 Feet (x2 bouts) Assistive device: Rolling walker (2 wheeled) Gait Pattern/deviations: Shuffle;Trunk flexed;Decreased stride length   Gait velocity interpretation: Below normal speed for age/gender General Gait Details: Pt with slow, unsteady shuffling like gait pattern. Cues to keep UEs on RW as pt reaching for grab bar in bathroom for support. Attempting to sit too soon on surface. Poor safety awareness. Min A for balance/RW management.  Stairs            Wheelchair Mobility    Modified Rankin (Stroke Patients Only) Modified Rankin (Stroke Patients Only) Pre-Morbid Rankin Score:  (unsure of premorbid PLOF.) Modified Rankin: Moderately severe disability     Balance Overall balance assessment: Needs assistance;History of Falls Sitting-balance support: Feet supported;No upper extremity supported Sitting balance-Leahy Scale: Fair Sitting balance - Comments: Able to reach outside BoS to grab toilet paper and assist with pericare without LOB.   Standing balance support: During functional activity Standing balance-Leahy Scale: Poor Standing balance comment: Requires BUE support on RW for balance/safety.                             Pertinent Vitals/Pain Pain Assessment: No/denies pain Pain Score: 0-No pain Pain Location: pain in right knee when walking    Home Living Family/patient expects to be discharged to:: Skilled nursing facility     Type of Home: Assisted living           Additional Comments: Pt from [facility]. Not  sure of pt's PLOF as pt reports inconsistent information during evaluation. Attempted to call facility however not able to get a hold of anyone.    Prior Function Level of Independence: Needs assistance   Gait / Transfers Assistance Needed: Pt reports being Mod I with rollator. Able to walk to  dining hall without difficulty.   ADL's / Homemaking Assistance Needed: Reports daughter lives in same facility in different apt and helps her as needed. Also reports she get some help from staff with dressing/bathing at baseline.  Comments: Reports 2 falls leading to admission. Pt questionable historian.     Hand Dominance   Dominant Hand: Right    Extremity/Trunk Assessment   Upper Extremity Assessment: Defer to OT evaluation (Diminished light touch sensation LUE.)           Lower Extremity Assessment: Generalized weakness;LLE deficits/detail;RLE deficits/detail RLE Deficits / Details: Grossly ~4/5 throughout hip, knee, ankle.  LLE Deficits / Details: Grossly ~4/5 throughout hip, knee, ankle.      Communication      Cognition Arousal/Alertness: Awake/alert Behavior During Therapy: WFL for tasks assessed/performed Overall Cognitive Status: No family/caregiver present to determine baseline cognitive functioning                      General Comments      Exercises        Assessment/Plan    PT Assessment Patient needs continued PT services  PT Diagnosis Difficulty walking;Generalized weakness   PT Problem List Decreased strength;Decreased cognition;Impaired sensation;Decreased activity tolerance;Decreased knowledge of use of DME;Decreased safety awareness;Decreased balance;Decreased mobility  PT Treatment Interventions Balance training;Gait training;Neuromuscular re-education;Patient/family education;Functional mobility training;Therapeutic activities;Therapeutic exercise;DME instruction   PT Goals (Current goals can be found in the Care Plan section) Acute Rehab PT Goals Patient Stated Goal: "to sit in the chair and drink my coffee." PT Goal Formulation: With patient Time For Goal Achievement: 05/09/14 Potential to Achieve Goals: Good    Frequency Min 3X/week   Barriers to discharge Decreased caregiver support Pt from ALF, not sure of level of  care/support at facility.    Co-evaluation               End of Session Equipment Utilized During Treatment: Gait belt Activity Tolerance: Patient limited by fatigue Patient left: in chair;with call bell/phone within reach;with chair alarm set;with nursing/sitter in room Nurse Communication: Mobility status         Time: 0454-0981 PT Time Calculation (min) (ACUTE ONLY): 18 min   Charges:   PT Evaluation $Initial PT Evaluation Tier I: 1 Procedure PT Treatments $Gait Training: 8-22 mins   PT G CodesAlvie Heidelberg A May 05, 2014, 10:04 AM  Alvie Heidelberg, PT, DPT [phone]

## 2014-04-25 NOTE — Progress Notes (Signed)
STROKE TEAM PROGRESS NOTE   HISTORY Tracey [name] is an 81 y.o. female comes from [facility] assisted living. At 3am pt slid down from her bed into the floor trying to go to the bathroom. Pt did not hit head. Staff assisted her back to bed. This morning the patient woke up and felt weak. Pt has hx of left sided deficit from cva several years ago but states that she feels much weaker the last few days on her left side than normal. Pt is not on blood thinners, just takes ASA. Patient has residual left sided weakness from previous stroke but feels this has increased.   Date last known well: Date: 04/24/2014 Time last known well: Time: 03:00 tPA Given: No: minimal symptoms and out of window    SUBJECTIVE (INTERVAL HISTORY) No family members present.  The patient still has left upper extremity weakness which is from a remote stroke. She follows with Dr. Anne Hahn in [facility].  OBJECTIVE Temp:  [97.5 F (36.4 C)-98.4 F (36.9 C)] 98.3 F (36.8 C) (01/08 0920) Pulse Rate:  [62-68] 67 (01/08 0920) Cardiac Rhythm:  [-] Normal sinus rhythm (01/08 0759) Resp:  [17-18] 18 (01/08 0920) BP: (115-132)/(33-77) 125/45 mmHg (01/08 0920) SpO2:  [95 %-99 %] 99 % (01/08 0920)  No results for input(s): GLUCAP in the last 168 hours.  Recent Labs Lab 04/24/14 0940 04/24/14 1017  NA 139 140  K 4.0 3.9  CL 105 105  CO2 25  --   GLUCOSE 131* 132*  BUN 13 15  CREATININE 0.91 0.80  CALCIUM 9.7  --     Recent Labs Lab 04/24/14 0940  AST 25  ALT 16  ALKPHOS 57  BILITOT 0.5  PROT 6.1  ALBUMIN 3.9    Recent Labs Lab 04/24/14 0940 04/24/14 1017  WBC 6.5  --   NEUTROABS 4.7  --   HGB 11.7* 12.9  HCT 35.5* 38.0  MCV 97.8  --   PLT 246  --    No results for input(s): CKTOTAL, CKMB, CKMBINDEX, TROPONINI in the last 168 hours.  Recent Labs  04/24/14 0940  LABPROT 14.9  INR 1.15    Recent Labs  04/24/14 1144  COLORURINE YELLOW  LABSPEC 1.014  PHURINE 5.5  GLUCOSEU NEGATIVE  HGBUR  NEGATIVE  BILIRUBINUR NEGATIVE  KETONESUR NEGATIVE  PROTEINUR NEGATIVE  UROBILINOGEN 0.2  NITRITE NEGATIVE  LEUKOCYTESUR NEGATIVE       Component Value Date/Time   CHOL 199 04/25/2014 0435   TRIG 122 04/25/2014 0435   HDL 75 04/25/2014 0435   CHOLHDL 2.7 04/25/2014 0435   VLDL 24 04/25/2014 0435   LDLCALC 100* 04/25/2014 0435   No results found for: HGBA1C    Component Value Date/Time   LABOPIA POSITIVE* 04/24/2014 1144   COCAINSCRNUR NONE DETECTED 04/24/2014 1144   LABBENZ NONE DETECTED 04/24/2014 1144   AMPHETMU NONE DETECTED 04/24/2014 1144   THCU NONE DETECTED 04/24/2014 1144   LABBARB NONE DETECTED 04/24/2014 1144     Recent Labs Lab 04/24/14 0940  ETH <5   I have personally reviewed the radiological images below and agree with the radiology interpretations.  2-D echocardiogram  04/25/2014 Study Conclusions - Left ventricle: The cavity size was normal. There was mild focal basal hypertrophy of the septum. Systolic function was normal. The estimated ejection fraction was in the range of 60% to 65%. Wall motion was normal; there were no regional wall motion abnormalities. Doppler parameters are consistent with abnormal left ventricular relaxation (grade 1 diastolic  dysfunction). The E/e&' ratio is between 8-15, suggesting indeterminate LV filling pressure. - Mitral valve: Thickened and partially flail anterior mitral leaflet. There is intermittent LVOT obstruction during diasole from the leaflet. Mild mitral regurgitation. - Tricuspid valve: There was mild regurgitation. - Pulmonary arteries: PA peak pressure: 32 mm Hg (S). - Inferior vena cava: The vessel was normal in size. The respirophasic diameter changes were in the normal range (>= 50%), consistent with normal central venous pressure. Impressions: - LVEF 60-65%, mild basal septal hypertrophy, partially flail and redundant anterior mitral leaflet with mild  regurgitation, diastolic dysfunction, indeterminate LV filling pressure.  Dg Chest 2 View 04/24/2014   1.  No acute pulmonary process.  2. Mild chronic lung disease.     Ct Head Wo Contrast 04/24/2014    No skull fracture or intracranial hemorrhage.  Fluid level within the right maxillary sinus. Although this may be related to sinus disease, result of inferior orbital floor fracture cannot be excluded. If this is of clinical concern, CT of the orbits recommended for further delineation.  Polypoid opacification right sphenoid sinus. Opacification ethmoid sinus air cells bilaterally. Opacification/mucosal thickening right frontal sinus. Mastoid air cells and middle ear cavities are clear bilaterally.  Remote bilateral frontal lobe infarcts with encephalomalacia. Prominent small vessel disease type changes.  Global atrophy. Ventricular prominence probably related to atrophy rather than hydrocephalus.     Mr Brain Wo Contrast 04/24/2014    MRI HEAD:  No acute ischemia on this moderately motion degraded examination.  Remote bilateral middle cerebral artery territory infarcts. Moderate to severe white matter changes suggest chronic small vessel ischemic disease.  Moderate to severe brain atrophy.   MRA HEAD:  No acute vascular process, or hemodynamically significant stenosis. Patent vessels on this mildly motion degraded examination.    PHYSICAL EXAM  Temp:  [97.5 F (36.4 C)-98.4 F (36.9 C)] 98.3 F (36.8 C) (01/08 0920) Pulse Rate:  [62-68] 67 (01/08 0920) Resp:  [17-18] 18 (01/08 0920) BP: (115-132)/(33-77) 125/45 mmHg (01/08 0920) SpO2:  [95 %-99 %] 99 % (01/08 0920)  General - Well nourished, well developed, in no apparent distress.  Ophthalmologic - not able to see through due to cataracts.  Cardiovascular - Regular rate and rhythm with no murmur.  Mental Status -  Level of arousal and orientation to place, self, situation but not to time. Language including expression, naming,  repetition, comprehension was assessed and found intact.  Cranial Nerves II - XII - II - Visual field intact OU. III, IV, VI - Extraocular movements intact. V - Facial sensation intact bilaterally. VII - Facial movement intact bilaterally. VIII - Hearing & vestibular intact bilaterally. X - Palate elevates symmetrically. XI - Chin turning & shoulder shrug intact bilaterally. XII - Tongue protrusion intact.  Motor Strength - The patient's strength was normal in all extremities and pronator drift was absent except left upper extremity 5-/5 proximally.  Bulk was normal and fasciculations were absent.   Motor Tone - Muscle tone was assessed at the neck and appendages and was normal.  Reflexes - The patient's reflexes were symmetrical in all extremities and she had no pathological reflexes.  Sensory - Light touch, temperature/pinprick was decreased on the left upper extremity.    Coordination - The patient had normal movements in the hands and feet with no ataxia or dysmetria.  Tremor was absent.  Gait and Station - not tested due to fatigue.  ASSESSMENT/PLAN Ms. Andrena Margerum is a 81 y.o. female with history of previous  CVA with left hemiparesis presenting with increased left upper extremity weakness after a fall.  She did not receive IV t-PA as she was outside of the therapeutic window for treatment. MRI negative.  LUE weakness - from previous stroke, no new stroke, likely recrudescence. However, patient had a previously bilateral frontal stroke, cardioembolic stroke is suspected. Due to baseline dementia, we'll not recommend extensive stroke workup. However, well recommended outpatient 30 day cardiac event monitoring.  Resultant -  Left upper extremity weakness from previous stroke.  MRI  as above  MRA  as above  Carotid Doppler - not ordered, consider out patient carotid Doppler with Dr. Anne HahnWillis.  Due to previously bilateral frontal stroke, cardioembolic stroke is suspected. Due to  baseline dementia, we'll not recommend extensive stroke workup. However, we'll recommended outpatient 30 day cardiac event monitoring. This can be done outpatient with Dr. Anne HahnWillis.  2D Echo  no cardiac source of emboli identified.  LDL - 100  HgbA1c pending  Lovenox for VTE prophylaxis  Diet - low sodium heart healthy with thin liquids  aspirin 81 mg orally every day prior to admission, now on aspirin 325 mg orally every day  Ongoing aggressive stroke risk factor management  Therapy recommendations:  SNF  Disposition:  SNF  Hypertension  Home meds: Lisinopril 2.5 mg daily   Stable  Hyperlipidemia  Home meds: No statin prior to admission   LDL 100, goal < 70  Pt has history of intolerance with statin  Pt prefer to follow up with PCP for HLD.  Dementia  On aricept  Follow up with Dr.Willis in [facility] in 2014  Will continue to follow up with Dr. Anne HahnWillis.  Other Stroke Risk Factors  Advanced age  Hx stroke/TIA  Hospital day # 1  Delton Seeavid Rinehuls PA-C [facility] Pager [phone] 04/25/2014, 3:56 PM  I, the attending vascular neurologist, have personally obtained a history, examined the patient, evaluated laboratory data, individually viewed imaging studies and agree with radiology interpretations. Together with the NP/PA, we formulated the assessment and plan of care which reflects our mutual decision.  I have made any additions or clarifications directly to the above note and agree with the findings and plan as currently documented.   81 yo F with dementia presented after fall with increasing left UE weakness. However, MRI negative and his UE weakness is the same as her residue weakness. However, MRI showed bilateral previous frontal stroke, concerning for cardioembolic strokes. Due to baseline dementia, will not pursue extensive stroke workup, will consider all patient carotid Doppler and 30 day cardiac event monitoring. Patient will follow up with Dr.  Anne HahnWillis in [facility].  Marvel PlanJindong Shaianne Nucci, MD PhD Stroke Neurology 04/25/2014 4:20 PM        To contact Stroke Continuity provider, please refer to [email or web address]. After hours, contact General Neurology

## 2014-04-25 NOTE — Evaluation (Addendum)
Clinical/Bedside Swallow Evaluation Patient Details  Name: Chapman MossLou Fedder MRN: [id number] Date of Birth: 09/07/1930  Today's Date: 04/25/2014 Time: 0815-0832 SLP Time Calculation (min) (ACUTE ONLY): 17 min  Past Medical History:  Past Medical History  Diagnosis Date  . Memory deficit 10/17/2012  . Abnormality of gait 10/17/2012  . Stroke     Mild left hemiparesis  . Urinary incontinence   . Lumbosacral spondylosis   . Hypertension   . Dyslipidemia   . Spinal stenosis   . Alzheimer disease    Past Surgical History:  Past Surgical History  Procedure Laterality Date  . Tubal ligation Bilateral   . Cataract extraction Bilateral    HPI:  81 yo [facility] slumping over in bathroom.  Pt with PMH + for memory deficits, left sided weakness from previous CVA, dyslipidemia, HTN, lumbosacral spondylosis.     Assessment / Plan / Recommendation Clinical Impression  Pt without overt clinical deficits regarding swallow.  CN exam unremarkable except for hoarseness.  Pt did report sensation of residuals in oropharynx with cracker bolus that effectively was cleared with liquid swallow.  Pt noted to clear throat before intake and after - which pt reports is baseline.    Pt does not recall iif chronically sense residuals in pharynx and therefore recommend follow up x1 re: swallow.  Recommend regular/thin with precautions.      Aspiration Risk  Mild    Diet Recommendation Regular;Thin liquid   Liquid Administration via: Cup;Straw Medication Administration: Whole meds with liquid Supervision: Patient able to self feed Compensations: Slow rate;Small sips/bites;Check for pocketing Postural Changes and/or Swallow Maneuvers: Out of bed for meals;Seated upright 90 degrees    Other  Recommendations Oral Care Recommendations: Oral care BID   Follow Up Recommendations    tbd   Frequency and Duration min 1 x/week  1 week   Pertinent Vitals/Pain Afebrile, decreased      Swallow  Study Prior Functional Status   see hhx    General Date of Onset: 04/25/14 HPI: 81 [facility] over in bathroom.  Pt with PMH + for memory deficits, left sided weakness from previous CVA, dyslipidemia, HTN, lumbosacral spondylosis.   Type of Study: Bedside swallow evaluation Diet Prior to this Study: NPO Temperature Spikes Noted: No Respiratory Status: Room air History of Recent Intubation: No Behavior/Cognition: Alert;Cooperative;Pleasant mood Oral Cavity - Dentition: Adequate natural dentition Self-Feeding Abilities: Able to feed self Patient Positioning: Upright in bed Baseline Vocal Quality: Hoarse Volitional Cough: Strong Volitional Swallow: Able to elicit    Oral/Motor/Sensory Function Overall Oral Motor/Sensory Function: Appears within functional limits for tasks assessed   Ice Chips Ice chips: Not tested   Thin Liquid Thin Liquid: Within functional limits Presentation: Cup;Self Fed;Straw    Nectar Thick Nectar Thick Liquid: Not tested   Honey Thick Honey Thick Liquid: Not tested   Puree Puree: Within functional limits Presentation: Self Fed;Spoon   Solid   GO    Solid: Within functional limits Presentation: Self Fed;Spoon Other Comments: pt reports sensation of stasis in oropharynx, cleared with liquid swallows       Mickie BailKimball, Aqil Goetting Ann Adalynd Donahoe Pont[place]ocKimball, MS [facility] SLP [phone]

## 2014-04-25 NOTE — Progress Notes (Signed)
  Echocardiogram 2D Echocardiogram has been performed.  Tracey [name] 04/25/2014, 9:21 AM

## 2014-04-25 NOTE — Discharge Instructions (Signed)
Follow with Primary MD Darrow BussingKOIRALA,DIBAS, MD in 7 days   Get CBC, CMP, 2 view Chest X ray checked  by Primary MD next visit.    Activity: As tolerated with Full fall precautions use walker/cane & assistance as needed   Disposition ALF   Diet: Heart Healthy  , with feeding assistance and aspiration precautions as needed.  For Heart failure patients - Check your Weight same time everyday, if you gain over 2 pounds, or you develop in leg swelling, experience more shortness of breath or chest pain, call your Primary MD immediately. Follow Cardiac Low Salt Diet and 1.8 lit/day fluid restriction.   On your next visit with your primary care physician please Get Medicines reviewed and adjusted.   Please request your Prim.MD to go over all Hospital Tests and Procedure/Radiological results at the follow up, please get all Hospital records sent to your Prim MD by signing hospital release before you go home.   If you experience worsening of your admission symptoms, develop shortness of breath, life threatening emergency, suicidal or homicidal thoughts you must seek medical attention immediately by calling 911 or calling your MD immediately  if symptoms less severe.  You Must read complete instructions/literature along with all the possible adverse reactions/side effects for all the Medicines you take and that have been prescribed to you. Take any new Medicines after you have completely understood and accpet all the possible adverse reactions/side effects.   Do not drive, operating heavy machinery, perform activities at heights, swimming or participation in water activities or provide baby sitting services if your were admitted for syncope or siezures until you have seen by Primary MD or a Neurologist and advised to do so again.  Do not drive when taking Pain medications.    Do not take more than prescribed Pain, Sleep and Anxiety Medications  Special Instructions: If you have smoked or chewed  Tobacco  in the last 2 yrs please stop smoking, stop any regular Alcohol  and or any Recreational drug use.  Wear Seat belts while driving.   Please note  You were cared for by a hospitalist during your hospital stay. If you have any questions about your discharge medications or the care you received while you were in the hospital after you are discharged, you can call the unit and asked to speak with the hospitalist on call if the hospitalist that took care of you is not available. Once you are discharged, your primary care physician will handle any further medical issues. Please note that NO REFILLS for any discharge medications will be authorized once you are discharged, as it is imperative that you return to your primary care physician (or establish a relationship with a primary care physician if you do not have one) for your aftercare needs so that they can reassess your need for medications and monitor your lab values.

## 2014-04-25 NOTE — Progress Notes (Signed)
Patient is discharged from room 4N26 at this time. Alert and in stable condition. IV site d/c'd as well as tele. Report given [facility] receiving [facility]. Transported by [facility] via stretcher with all belongings at side.

## 2014-04-25 NOTE — Evaluation (Signed)
Speech Language Pathology Evaluation Patient Details Name: Chapman MossLou Thal MRN: [id number] DOB: 11/27/1930 Today's Date: 04/25/2014 Time: [id number] SLP Time Calculation (min) (ACUTE ONLY): 13 min  Problem List:  Patient Active Problem List   Diagnosis Date Noted  . CVA (cerebral infarction) 04/24/2014  . Left-sided weakness 04/24/2014  . Memory deficit 10/17/2012  . Abnormality of gait 10/17/2012   Past Medical History:  Past Medical History  Diagnosis Date  . Memory deficit 10/17/2012  . Abnormality of gait 10/17/2012  . Stroke     Mild left hemiparesis  . Urinary incontinence   . Lumbosacral spondylosis   . Hypertension   . Dyslipidemia   . Spinal stenosis   . Alzheimer disease    Past Surgical History:  Past Surgical History  Procedure Laterality Date  . Tubal ligation Bilateral   . Cataract extraction Bilateral    HPI:  81 [facility] over in bathroom.  Pt with PMH + for memory deficits, left sided weakness from previous CVA, dyslipidemia, HTN, lumbosacral spondylosis.  All imaging studies negative.  [facility], reports she does not have to do "anything" there.  She is a retired Engineer, civil (consulting)nurse and enjoys reading, doing exercise programs on tv and walking per her statement.     Assessment / Plan / Recommendation Clinical Impression  Pt with baseline cognitive deficits impacting memory but family not present to confirm premorbid level.  Pt was able to demonstrate use of her cell phone to identify date, recall that her MD informed her all her imaging studies were negative.  She followed 3 step commands without difficulties and does not have dysarthria.  Pt with functional memory deficits that may impact her ability for medicine/financial management but she resides at ALF and reports all is managed for her.   Please note pt does reports she takes 2 mile walks daily at times following different routes, advised her to use caution with this given her  memory difficulties.  SLP to sign off as pt will have support needed at her environment.      SLP Assessment   (no follow up for cognition as all imaging results negative )    Follow Up Recommendations  None    Frequency and Duration min 1 x/week      Pertinent Vitals/Pain Pain Assessment: No/denies pain Pain Score: 0-No pain Pain Location: pain in right knee when walking   SLP Goals  Patient/Family Stated Goal: none stated  SLP Evaluation Prior Functioning  Cognitive/Linguistic Baseline: Baseline deficits Baseline deficit details: memory deficits per neurology note from 2014 Type of Home: Assisted living Education: college, retired Scientist, clinical (histocompatibility and immunogenetics)nurse   Cognition  Overall Cognitive Status: History of cognitive impairments - at baseline (no family present to confirm previous cognitive defictis) Arousal/Alertness: Awake/alert Orientation Level: Oriented to person;Disoriented to place Attention: Sustained Sustained Attention: Appears intact Memory: Impaired (did not recall how long living in [place] and stated she lives with daughter *lives at ALF) Memory Impairment: Storage deficit;Retrieval deficit;Decreased recall of new information;Decreased long term memory;Decreased short term memory;Prospective memory (did not recall ) Decreased Long Term Memory: Verbal complex Decreased Short Term Memory: Verbal complex Awareness: Impaired Awareness Impairment: Intellectual impairment (decreased awareness to cognitive deficits and dangers with walking 2 miles outside daily (pt reports takes different route)) Problem Solving: Appears intact (for basic, recalled unable to get up on her own due to need to be cleared by PT/OT) Safety/Judgment: Impaired    Comprehension  Auditory Comprehension Overall Auditory Comprehension:  Appears within functional limits for tasks assessed Yes/No Questions: Not tested Commands: Within Functional Limits (3 steps) Conversation: Complex Visual  Recognition/Discrimination Discrimination: Not tested Reading Comprehension Reading Status: Impaired Paragraph Level: Impaired (multiple paragraph level impaired although pt reports she enjoys reading novels, etc) Functional Environmental (signs, name badge): Within functional limits    Expression Expression Primary Mode of Expression: Verbal Verbal Expression Overall Verbal Expression: Appears within functional limits for tasks assessed Initiation: No impairment Repetition: No impairment Naming: Not tested Written Expression Dominant Hand: Right   Oral / Motor Oral Motor/Sensory Function Overall Oral Motor/Sensory Function: Appears within functional limits for tasks assessed Motor Speech Respiration: Within functional limits Phonation: Hoarse Resonance: Within functional limits Articulation: Within functional limitis Intelligibility: Intelligible Motor Planning: Witnin functional limits Motor Speech Errors: Not applicable   GO     Donavan Burnet, MS [facility] SLP [phone]

## 2014-04-25 NOTE — Clinical Social Work Psychosocial (Signed)
Clinical Social Work Department BRIEF PSYCHOSOCIAL ASSESSMENT 04/25/2014  Patient:  Chapman MossCONRY,Curtistine     Account Number:  [id number]     Admit date:  04/24/2014  Clinical Social Worker:  Bo McclintockBIBBS,Alonda Weaber, LCSWA  Date/Time:  04/25/2014 10:52 PM  Referred by:  Physician  Date Referred:  04/25/2014 Referred for  ALF Placement   Other Referral:   Interview type:  Family Other interview type:   Pt is disoriented; Pt's daughter Elisabeth MostVicki Zina [phone]    PSYCHOSOCIAL DATA Living Status:  FACILITY [facility] Level of care:  Assisted Living Primary support name:  Elisabeth MostVicki Zina Primary support relationship to patient:  CHILD, ADULT Degree of support available:   Strong Support    CURRENT CONCERNS Current Concerns  None Noted   Other Concerns:    SOCIAL WORK ASSESSMENT / PLAN CSW spoke with pt's daughter Chip BoerVicki. CSW introduced self and purpose of call. CSW confirmed [facility]. CSW informed Chip BoerVicki that the pt will discharge back to her ALF today. CSW and Chip BoerVicki discussed ambulance transport. CSW contact [facility] at [phone] to schedule transport for the pt. [facility]. Bedside RN provided number to call reported.   Assessment/plan status:  Psychosocial Support/Ongoing Assessment of Needs Other assessment/ plan:   Information/referral to community resources:    PATIENT'S/FAMILY'S RESPONSE TO PLAN OF CARE: Chip BoerVicki appeared to be shocked to hear the pt had been discharge. Chip BoerVicki requested to speak to the bedside RN reagarding dicharge. After talking to the bedside RN Chip BoerVicki agreed to allow the CSW to transport the pt back to her ALF.   Naser Schuld, MSW, LCSWA [phone]

## 2014-04-25 NOTE — Discharge Summary (Addendum)
Tracey [name], 81 y.o., DOB 02-04-1931, MRN [id number]. Admission date: 04/24/2014 Discharge Date 04/25/2014 Primary MD Darrow Bussing, MD Admitting Physician Alba Cory, MD  Admission Diagnosis  CVA  Discharge Diagnosis   Active Problems:   CVA (cerebral infarction)   Left-sided weakness    PCP please follow on : - Please check CBC, BMP during next visit. - Please arrange for neurology follow-up, as patient will need carotid Doppler and 30 days heart monitor as an outpatient.   Past Medical History  Diagnosis Date  . Memory deficit 10/17/2012  . Abnormality of gait 10/17/2012  . Stroke     Mild left hemiparesis  . Urinary incontinence   . Lumbosacral spondylosis   . Hypertension   . Dyslipidemia   . Spinal stenosis   . Alzheimer disease     Past Surgical History  Procedure Laterality Date  . Tubal ligation Bilateral   . Cataract extraction Bilateral      Hospital Course See H&P, Labs, Consult and Test reports for all details in brief, patient was admitted for  Active Problems:   CVA (cerebral infarction)   Left-sided weakness  Tracey [name] is an 81 y.o. female comes from [facility]. Presents from facility was complaints of worsening left sided weakness , and an episode where she slid on the floor  trying to go to the bathroom. Pt did not hit head.  Pt has hx of left sided deficit from cva several years ago but states that she feels much weaker the last few days on her left side than normal. Pt is on ASA. Patient has residual left sided weakness from previous stroke but feels this has increased.  Patient was seen by neurology service, with recommendation for MRI brain/MRA head, no acute ischemia or acute vascular process or hemodynamically significant stenosis were found on MRI, patient was seen by SLP and her diet was advanced. - please follow with neurology Dr Anne Hahn at [facility] for furthur evaluation including 30 days heart monitor and carotid Doppler. - Patient  urinalysis, chest x-ray does not show any evidence of infection, TSH and B-12 were within normal limits.  Worsening left side weakness and slurred speech.  -No evidence of stroke, no acute finding in MRI brain or MRA head.Patient has baseline dementia, POIBLY   this is contributing to her symptoms. -Urinalysis is negative, chest x-ray has no acute finding, TSH and B-12 within normal limits -Continue with aspirin and discharge -Patient passed swallow evaluation. Resume on heart healthy diet. - LDL is 100, has allergy to statins, so can't be started on Lipitor, will continue with fish oil.  -HTN  Resume home medication on discharge  -Dementia;  continue Aricept  Consults   Neurology  Significant Tests:  See full reports for all details    Dg Chest 2 View  04/24/2014   CLINICAL DATA:  Cough.  Left-sided weakness.  EXAM: CHEST  2 VIEW  COMPARISON:  Left rib series 08/30/2012  FINDINGS: The patient is rotated to the right. The heart size is normal. Mild hyperinflation and coarsened interstitial marking. There is biapical pleural parenchymal scarring. Pulmonary vasculature is normal. Subsegmental atelectasis at the left lung base, no confluent airspace disease. There is scoliotic curvature of the included upper lumbar spine. No acute osseous abnormalities are seen.  IMPRESSION: 1.  No acute pulmonary process. 2. Mild chronic lung disease.   Electronically Signed   By: Rubye Oaks M.D.   On: 04/24/2014 17:59   Ct Head Wo Contrast  04/24/2014   CLINICAL DATA:  81 year old female post fall in bathroom. No history of loss of consciousness provided. Initial encounter.  EXAM: CT HEAD WITHOUT CONTRAST  TECHNIQUE: Contiguous axial images were obtained from the base of the skull through the vertex without intravenous contrast.  COMPARISON:  None.  FINDINGS: No skull fracture.  Fluid level within the right maxillary sinus. Although this may be related to sinus disease, result of inferior orbital floor  fracture cannot be excluded. If this is of clinical concern, CT of the orbits recommended for further delineation.  Polypoid opacification right sphenoid sinus. Opacification ethmoid sinus air cells bilaterally. Opacification/mucosal thickening right frontal sinus. Mastoid air cells and middle ear cavities are clear bilaterally.  No intracranial hemorrhage.  Remote bilateral frontal lobe infarcts with encephalomalacia. Prominent small vessel disease type changes. No CT evidence of large acute infarct.  Global atrophy. Ventricular prominence probably related to atrophy rather than hydrocephalus.  No intracranial mass lesion noted on this unenhanced exam.  Vascular calcifications.  Globes appear grossly intact.  IMPRESSION: No skull fracture or intracranial hemorrhage.  Fluid level within the right maxillary sinus. Although this may be related to sinus disease, result of inferior orbital floor fracture cannot be excluded. If this is of clinical concern, CT of the orbits recommended for further delineation.  Polypoid opacification right sphenoid sinus. Opacification ethmoid sinus air cells bilaterally. Opacification/mucosal thickening right frontal sinus. Mastoid air cells and middle ear cavities are clear bilaterally.  Remote bilateral frontal lobe infarcts with encephalomalacia. Prominent small vessel disease type changes.  Global atrophy. Ventricular prominence probably related to atrophy rather than hydrocephalus.   Electronically Signed   By: Bridgett LarssonSteve  Olson M.D.   On: 04/24/2014 11:13   Mr Brain Wo Contrast  04/24/2014   CLINICAL DATA:  Larey SeatFell from bed on to floor yesterday, no head trauma. Subsequent weakness and worsening of patient's LEFT-sided deficit from prior stroke.  EXAM: MRI HEAD WITHOUT CONTRAST  MRA HEAD WITHOUT CONTRAST  TECHNIQUE: Multiplanar, multiecho pulse sequences of the brain and surrounding structures were obtained without intravenous contrast. Angiographic images of the head were obtained using  MRA technique without contrast.  COMPARISON:  CT of the head April 24, 2014 at 1046 hr  FINDINGS: MRI HEAD FINDINGS  Moderately motion degraded examination. No reduced diffusion to suggest acute ischemia. No susceptibility artifact to suggest hemorrhage. Bifrontal cystic encephalomalacia, with ex vacuo dilatation subjacent ventricle. Moderate to severe ventriculomegaly. No hydrocephalus. Confluent periventricular white matter T2 hyperintensities. No midline shift or mass effect.  No abnormal extra-axial fluid collections. Status post bilateral ocular lens implants. RIGHT maxillary sinus air-fluid level, RIGHT sphenoid mucosal retention cyst. Mastoid air cells appear well-aerated. No abnormal bone marrow signal. No abnormal sellar expansion. No cerebellar tonsillar ectopia.  MRA HEAD FINDINGS  Mildly motion degraded examination. Anterior circulation: Normal flow related enhancement of the included cervical, petrous, cavernous and supra clinoid internal carotid arteries. Patent anterior communicating artery. Normal flow related enhancement of the anterior and middle cerebral arteries, including more distal segments.  No large vessel occlusion, hemodynamically significant stenosis, abnormal luminal irregularity, aneurysm.  Posterior circulation: Codominant vertebral arteries. Basilar artery is patent, with normal flow related enhancement of the main branch vessels. Normal flow related enhancement of the posterior cerebral arteries.  No large vessel occlusion, hemodynamically significant stenosis, abnormal luminal irregularity, aneurysm.  IMPRESSION: MRI HEAD: No acute ischemia on this moderately motion degraded examination.  Remote bilateral middle cerebral artery territory infarcts. Moderate to severe white matter changes suggest chronic small  vessel ischemic disease.  Moderate to severe brain atrophy.  MRA HEAD: No acute vascular process, or hemodynamically significant stenosis. Patent vessels on this mildly motion  degraded examination.   Electronically Signed   By: Awilda Metro   On: 04/24/2014 21:04   Mr Maxine Glenn Head/brain Wo Cm  04/24/2014   CLINICAL DATA:  Larey Seat from bed on to floor yesterday, no head trauma. Subsequent weakness and worsening of patient's LEFT-sided deficit from prior stroke.  EXAM: MRI HEAD WITHOUT CONTRAST  MRA HEAD WITHOUT CONTRAST  TECHNIQUE: Multiplanar, multiecho pulse sequences of the brain and surrounding structures were obtained without intravenous contrast. Angiographic images of the head were obtained using MRA technique without contrast.  COMPARISON:  CT of the head April 24, 2014 at 1046 hr  FINDINGS: MRI HEAD FINDINGS  Moderately motion degraded examination. No reduced diffusion to suggest acute ischemia. No susceptibility artifact to suggest hemorrhage. Bifrontal cystic encephalomalacia, with ex vacuo dilatation subjacent ventricle. Moderate to severe ventriculomegaly. No hydrocephalus. Confluent periventricular white matter T2 hyperintensities. No midline shift or mass effect.  No abnormal extra-axial fluid collections. Status post bilateral ocular lens implants. RIGHT maxillary sinus air-fluid level, RIGHT sphenoid mucosal retention cyst. Mastoid air cells appear well-aerated. No abnormal bone marrow signal. No abnormal sellar expansion. No cerebellar tonsillar ectopia.  MRA HEAD FINDINGS  Mildly motion degraded examination. Anterior circulation: Normal flow related enhancement of the included cervical, petrous, cavernous and supra clinoid internal carotid arteries. Patent anterior communicating artery. Normal flow related enhancement of the anterior and middle cerebral arteries, including more distal segments.  No large vessel occlusion, hemodynamically significant stenosis, abnormal luminal irregularity, aneurysm.  Posterior circulation: Codominant vertebral arteries. Basilar artery is patent, with normal flow related enhancement of the main branch vessels. Normal flow related  enhancement of the posterior cerebral arteries.  No large vessel occlusion, hemodynamically significant stenosis, abnormal luminal irregularity, aneurysm.  IMPRESSION: MRI HEAD: No acute ischemia on this moderately motion degraded examination.  Remote bilateral middle cerebral artery territory infarcts. Moderate to severe white matter changes suggest chronic small vessel ischemic disease.  Moderate to severe brain atrophy.  MRA HEAD: No acute vascular process, or hemodynamically significant stenosis. Patent vessels on this mildly motion degraded examination.   Electronically Signed   By: Awilda Metro   On: 04/24/2014 21:04     [name]   Subjective:   Tracey [name] has no headache,no chest abdominal pain,no new weakness tingling or numbness, feels much better [name].  Objective:   Blood pressure 125/45, pulse 67, temperature 98.3 F (36.8 C), temperature source Oral, resp. rate 18, SpO2 99 %.  Intake/Output Summary (Last 24 hours) at 04/25/14 0959 Last data filed at 04/24/14 1817  Gross per 24 hour  Intake      1 ml  Output    350 ml  Net   -349 ml    Exam Awake Alert, Oriented *3, No new F.N deficits, Normal affect [place].AT,PERRAL Supple Neck,No JVD, No cervical lymphadenopathy appriciated.  Symmetrical Chest wall movement, Good air movement bilaterally, CTAB RRR,No Gallops,Rubs or new Murmurs, No Parasternal Heave +ve B.Sounds, Abd Soft, Non tender, No organomegaly appriciated, No rebound -guarding or rigidity. No Cyanosis, Clubbing or edema, No new Rash or bruise,  mild left-sided weakness 4/5.  Data Review   Cultures -   CBC w Diff: Lab Results  Component Value Date   WBC 6.5 04/24/2014   HGB 12.9 04/24/2014   HCT 38.0 04/24/2014   PLT 246 04/24/2014   LYMPHOPCT 16 04/24/2014  MONOPCT 8 04/24/2014   EOSPCT 4 04/24/2014   BASOPCT 0 04/24/2014   CMP: Lab Results  Component Value Date   NA 140 04/24/2014   K 3.9 04/24/2014   CL 105 04/24/2014   CO2 25 04/24/2014    BUN 15 04/24/2014   CREATININE 0.80 04/24/2014   PROT 6.1 04/24/2014   ALBUMIN 3.9 04/24/2014   BILITOT 0.5 04/24/2014   ALKPHOS 57 04/24/2014   AST 25 04/24/2014   ALT 16 04/24/2014  .  Micro Results No results found for this or any previous visit (from the past 240 hour(s)).   Discharge Instructions      Follow-up Information    Follow up with Darrow Bussing, MD. Schedule an appointment as soon as possible for a visit in 1 week.   Specialty:  Family Medicine   Contact information:   [address] [place] [address] [phone]       Discharge Medications     Medication List    TAKE these medications        acetaminophen-codeine 300-30 MG per tablet  Commonly known as:  TYLENOL #3  Take 1 tablet by mouth every 8 (eight) hours as needed for moderate pain.     aspirin 81 MG tablet  Take 81 mg by mouth daily.     calcium-vitamin D 500-200 MG-UNIT per tablet  Commonly known as:  OSCAL WITH D  Take 1 tablet by mouth daily with breakfast.     cholecalciferol 1000 UNITS tablet  Commonly known as:  VITAMIN D  Take 1,000 Units by mouth daily.     donepezil 5 MG tablet  Commonly known as:  ARICEPT  Take 1 tablet (5 mg total) by mouth at bedtime.     Fish Oil 1000 MG Caps  Take 1,000 mg by mouth daily.     fluocinonide ointment 0.05 %  Commonly known as:  LIDEX  Apply 1 application topically 2 (two) times daily.     lisinopril 2.5 MG tablet  Commonly known as:  PRINIVIL,ZESTRIL  Take 2.5 mg by mouth daily.     Melatonin 3 MG Caps  Take 3 mg by mouth at bedtime.     methocarbamol 750 MG tablet  Commonly known as:  ROBAXIN  Take 750 mg by mouth 4 (four) times daily.     PENNSAID TD  Place onto the skin.     PENNSAID 2 % Soln  Generic drug:  Diclofenac Sodium  Place 2 Squirts onto the skin daily as needed (pain).     polyethylene glycol packet  Commonly known as:  MIRALAX / GLYCOLAX  Take 17 g by mouth daily as needed for  mild constipation.     traMADol 50 MG tablet  Commonly known as:  ULTRAM  Take 50 mg by mouth every 6 (six) hours as needed for pain.         Total Time in preparing paper work, data evaluation and todays exam - 35 minutes  Lux Skilton M.D on 04/25/2014 at 9:59 AM  [facility] Office  [phone]

## 2014-06-24 ENCOUNTER — Encounter: Payer: Self-pay | Admitting: Neurology

## 2014-06-24 ENCOUNTER — Ambulatory Visit (INDEPENDENT_AMBULATORY_CARE_PROVIDER_SITE_OTHER): Payer: Medicare Other | Admitting: Neurology

## 2014-06-24 VITALS — BP 102/63 | HR 72 | Ht 63.0 in | Wt 105.2 lb

## 2014-06-24 DIAGNOSIS — G459 Transient cerebral ischemic attack, unspecified: Secondary | ICD-10-CM | POA: Insufficient documentation

## 2014-06-24 DIAGNOSIS — I69398 Other sequelae of cerebral infarction: Secondary | ICD-10-CM

## 2014-06-24 DIAGNOSIS — G451 Carotid artery syndrome (hemispheric): Secondary | ICD-10-CM | POA: Diagnosis not present

## 2014-06-24 DIAGNOSIS — R4182 Altered mental status, unspecified: Secondary | ICD-10-CM | POA: Insufficient documentation

## 2014-06-24 DIAGNOSIS — R404 Transient alteration of awareness: Secondary | ICD-10-CM | POA: Diagnosis not present

## 2014-06-24 DIAGNOSIS — R413 Other amnesia: Secondary | ICD-10-CM

## 2014-06-24 DIAGNOSIS — R269 Unspecified abnormalities of gait and mobility: Secondary | ICD-10-CM | POA: Diagnosis not present

## 2014-06-24 DIAGNOSIS — I69359 Hemiplegia and hemiparesis following cerebral infarction affecting unspecified side: Secondary | ICD-10-CM | POA: Insufficient documentation

## 2014-06-24 DIAGNOSIS — I6340 Cerebral infarction due to embolism of unspecified cerebral artery: Secondary | ICD-10-CM

## 2014-06-24 HISTORY — DX: Hemiplegia and hemiparesis following cerebral infarction affecting unspecified side: I69.359

## 2014-06-24 HISTORY — DX: Hemiplegia and hemiparesis following cerebral infarction affecting unspecified side: I69.398

## 2014-06-24 NOTE — Progress Notes (Addendum)
Reason for visit: Cerebrovascular disease  Tracey [name] is an 81 y.o. female  History of present illness:  Tracey [name] is an 81 year old right-handed white female with a history of cerebrovascular disease and dementia. The patient also has an associated gait disorder, she was last seen in July 2014. The patient has been on Aricept for the dementia. The patient currently is residing in an extended care facility. She was admitted to the hospital in early January 2016 with worsening left-sided weakness. The patient had persistence of symptoms for at least 8-12 hours. The patient resolved her deficits during that hospitalization. Since being in the hospital, her caretaker indicates that she has had episodes of transient confusion and lack of awareness. She has not had any overt seizure-type events. During the hospital stay, she was evaluated for a possible new stroke. MRI of the brain was done, and appears to show extensive white matter disease bilaterally, but no new infarct is seen. A 2-D echocardiogram was also done and was relatively unremarkable. The patient has been treated with aspirin. She was discharged, and she had recommendations from Dr. Roda Shutters for a carotid Doppler study and a prolonged cardiac monitor to be done as an outpatient. She is sent to this office for an evaluation. The patient currently uses a walker for ambulation, she will fall on occasion, but she is not falling frequently.  Past Medical History  Diagnosis Date  . Memory deficit 10/17/2012  . Abnormality of gait 10/17/2012  . Stroke     Mild left hemiparesis  . Urinary incontinence   . Lumbosacral spondylosis   . Hypertension   . Dyslipidemia   . Spinal stenosis   . Alzheimer disease   . Hemiparesis and alteration of sensations as late effects of stroke 06/24/2014    Past Surgical History  Procedure Laterality Date  . Tubal ligation Bilateral   . Cataract extraction Bilateral     Family History  Problem Relation Age of  Onset  . Parkinsonism Brother   . Dementia Brother   . Heart disease Brother     Social history:  reports that she has never smoked. She has never used smokeless tobacco. She reports that she drinks about 0.6 oz of alcohol per week. She reports that she does not use illicit drugs.    Allergies  Allergen Reactions  . Alendronate Sodium   . Simvastatin     Medications:  Prior to Admission medications   Medication Sig Start Date End Date Taking? Authorizing Provider  acetaminophen-codeine (TYLENOL #3) 300-30 MG per tablet Take 1 tablet by mouth every 8 (eight) hours as needed for moderate pain.    Historical Provider, MD  aspirin 81 MG tablet Take 81 mg by mouth daily.    Historical Provider, MD  calcium-vitamin D (OSCAL WITH D) 500-200 MG-UNIT per tablet Take 1 tablet by mouth daily with breakfast.    Historical Provider, MD  cholecalciferol (VITAMIN D) 1000 UNITS tablet Take 1,000 Units by mouth daily.    Historical Provider, MD  Diclofenac Sodium (PENNSAID TD) Place onto the skin.    Historical Provider, MD  donepezil (ARICEPT) 5 MG tablet Take 1 tablet (5 mg total) by mouth at bedtime. Patient taking differently: Take 10 mg by mouth daily.  10/25/12   York Spaniel, MD  fluocinonide ointment (LIDEX) 0.05 % Apply 1 application topically 2 (two) times daily.    Historical Provider, MD  Melatonin 3 MG CAPS Take 3 mg by mouth at bedtime.  Historical Provider, MD  Omega-3 Fatty Acids (FISH OIL) 1000 MG CAPS Take 1,000 mg by mouth daily.    Historical Provider, MD  polyethylene glycol (MIRALAX / GLYCOLAX) packet Take 17 g by mouth daily as needed for mild constipation.    Historical Provider, MD  traMADol (ULTRAM) 50 MG tablet Take 50 mg by mouth every 6 (six) hours as needed for pain.    Historical Provider, MD    ROS:  Out of a complete 14 system review of symptoms, the patient complains only of the following symptoms, and all other reviewed systems are negative.  Weight  loss Skin rash Joint pain Memory disturbance  Blood pressure 102/63, pulse 72, height 5\' 3"  (1.6 m), weight 105 lb 3.2 oz (47.718 kg).  Physical Exam  General: The patient is alert and cooperative at the time of the examination.  Skin: No significant peripheral edema is noted.   Neurologic Exam  Mental status: The Mini-Mental Status Examination done today shows a total score of 17/30.  Cranial nerves: Facial symmetry is present. Speech is normal, no aphasia or dysarthria is noted. Extraocular movements are full. Visual fields are full. Pupils are equal, round, and reactive to light. Discs are flat bilaterally.  Motor: The patient has good strength in all 4 extremities.  Sensory examination: Soft touch sensation is symmetric on the face, arms, and legs, but pinprick sensation is slightly decreased on the left hand relative to the right. Vibration sensation is symmetric bilaterally.  Coordination: The patient has good finger-nose-finger and heel-to-shin bilaterally.  Gait and station: The patient has an unsteady, wide-based gait. The patient uses a walker for ambulation. Tandem gait was not attempted. Romberg is negative. No drift is seen.  Reflexes: Deep tendon reflexes are symmetric.   MRI brain 04/24/14:  IMPRESSION: MRI HEAD: No acute ischemia on this moderately motion degraded examination.  Remote bilateral middle cerebral artery territory infarcts. Moderate to severe white matter changes suggest chronic small vessel ischemic disease.  Moderate to severe brain atrophy.  MRA HEAD: No acute vascular process, or hemodynamically significant stenosis. Patent vessels on this mildly motion degraded examination.  *MRI images were reviewed online, agree with written report.   2D echo report 04/25/14:  Impressions:  - LVEF 60-65%, mild basal septal hypertrophy, partially flail and redundant anterior mitral leaflet with mild regurgitation, diastolic dysfunction,  indeterminate LV filling pressure.    Assessment/Plan:  1. Memory disturbance, dementia  2. Cerebrovascular disease  3. Clinical TIA event, January 2016  4. Gait disturbance  The patient had a transient event of left-sided weakness in January 2016. The patient has had some episodes of transient confusion since that time. The patient is at risk for seizure-type events, and given the lack of acute changes by MRI, an EEG study will be done. The stroke evaluation recommended by Dr. Roda ShuttersXu will be done as an outpatient to include a carotid Doppler study and a 30 day prolonged heart monitor. The patient will continue her aspirin for now. She will follow-up in 6 months.  Contact person is Chip BoerVicki at [phone].  Marlan Palau. Keith Montgomery Rothlisberger MD 06/24/2014 8:17 PM  [facility] [address] [place] [id number]  Phone [phone] Fax [phone]

## 2014-06-24 NOTE — Patient Instructions (Signed)

## 2014-06-25 ENCOUNTER — Other Ambulatory Visit: Payer: Self-pay | Admitting: Neurology

## 2014-06-25 DIAGNOSIS — I480 Paroxysmal atrial fibrillation: Secondary | ICD-10-CM

## 2014-07-03 ENCOUNTER — Telehealth: Payer: Self-pay | Admitting: Neurology

## 2014-07-03 ENCOUNTER — Ambulatory Visit (INDEPENDENT_AMBULATORY_CARE_PROVIDER_SITE_OTHER): Payer: Medicare Other | Admitting: Neurology

## 2014-07-03 ENCOUNTER — Encounter: Payer: Self-pay | Admitting: *Deleted

## 2014-07-03 DIAGNOSIS — R413 Other amnesia: Secondary | ICD-10-CM

## 2014-07-03 DIAGNOSIS — R404 Transient alteration of awareness: Secondary | ICD-10-CM

## 2014-07-03 DIAGNOSIS — G451 Carotid artery syndrome (hemispheric): Secondary | ICD-10-CM

## 2014-07-03 DIAGNOSIS — R269 Unspecified abnormalities of gait and mobility: Secondary | ICD-10-CM

## 2014-07-03 NOTE — Procedures (Signed)
    History:  Chapman MossLou Micciche is an 81 year old patient with a history of cerebrovascular disease and dementia. The patient has had episodes of transient confusion and lack of awareness. She is being evaluated for possible seizures.  This is a routine EEG. No skull defects are noted. Medications include Tylenol, aspirin, calcium supplementation, vitamin D, Aricept, melatonin, fish oil, and MiraLAX.   EEG classification: Normal awake  Description of the recording: The background rhythms of this recording consists of a fairly well modulated medium amplitude alpha rhythm of 9 Hz that is reactive to eye opening and closure. As the record progresses, the patient appears to remain in the waking state throughout the recording. Photic stimulation was performed, resulting in a bilateral and symmetric photic driving response. Hyperventilation was also performed, resulting in a minimal buildup of the background rhythm activities without significant slowing seen. At no time during the recording does there appear to be evidence of spike or spike wave discharges or evidence of focal slowing. EKG monitor shows an occasionally irregular heart rhythm, with a heart rate of 56.  Impression: This is a normal EEG recording in the waking state. No evidence of ictal or interictal discharges are seen.

## 2014-07-03 NOTE — Progress Notes (Signed)
Patient ID: Tracey MossLou [name], female   DOB: 10/16/1930, 81 y.o.   MRN: [id number] Patient declined to have a 30 day cardiac event monitor applied.

## 2014-07-03 NOTE — Telephone Encounter (Signed)
I called the daughter. The EEG study was unremarkable.

## 2014-07-07 ENCOUNTER — Telehealth: Payer: Self-pay | Admitting: Neurology

## 2014-07-07 DIAGNOSIS — I480 Paroxysmal atrial fibrillation: Secondary | ICD-10-CM

## 2014-07-07 NOTE — Telephone Encounter (Signed)
Pt's daughter is calling stating pt did not want to get the heart monitor, and a inserted loop was suggested to her.  Please call back and advise what needs to be done. She also wants to explain why her mother doesn't want the heart monitor.

## 2014-07-07 NOTE — Telephone Encounter (Signed)
I called patient. She did not wish to undergo the heart monitor, she may be amenable to the loop recorder, I will try to make the referral to get this done.

## 2014-07-10 ENCOUNTER — Ambulatory Visit (INDEPENDENT_AMBULATORY_CARE_PROVIDER_SITE_OTHER): Payer: Medicare Other

## 2014-07-10 DIAGNOSIS — R269 Unspecified abnormalities of gait and mobility: Secondary | ICD-10-CM

## 2014-07-10 DIAGNOSIS — R404 Transient alteration of awareness: Secondary | ICD-10-CM

## 2014-07-10 DIAGNOSIS — R413 Other amnesia: Secondary | ICD-10-CM

## 2014-07-10 DIAGNOSIS — G451 Carotid artery syndrome (hemispheric): Secondary | ICD-10-CM

## 2014-07-22 ENCOUNTER — Telehealth: Payer: Self-pay | Admitting: Neurology

## 2014-07-22 NOTE — Telephone Encounter (Signed)
I the daughter with the results of the carotid Doppler study. It does show 50 to SICU percent stenosis of the right proximal internal carotid artery. The patient had a right brain stroke. No indication for surgical intervention concerning this.

## 2014-08-14 ENCOUNTER — Encounter: Payer: Self-pay | Admitting: Internal Medicine

## 2014-08-14 ENCOUNTER — Ambulatory Visit (INDEPENDENT_AMBULATORY_CARE_PROVIDER_SITE_OTHER): Payer: Medicare Other | Admitting: Internal Medicine

## 2014-08-14 VITALS — BP 120/58 | HR 97 | Ht 62.0 in | Wt 117.2 lb

## 2014-08-14 DIAGNOSIS — M6289 Other specified disorders of muscle: Secondary | ICD-10-CM

## 2014-08-14 DIAGNOSIS — E785 Hyperlipidemia, unspecified: Secondary | ICD-10-CM | POA: Diagnosis not present

## 2014-08-14 DIAGNOSIS — R531 Weakness: Secondary | ICD-10-CM

## 2014-08-14 DIAGNOSIS — G458 Other transient cerebral ischemic attacks and related syndromes: Secondary | ICD-10-CM | POA: Diagnosis not present

## 2014-08-14 NOTE — Assessment & Plan Note (Signed)
The etiology of her symptoms is unclear. She has had 2 neurologic events. We discussed the treatment options in detail. The risk, goals, benefits, and expectations of insertion of an implantable loop recorder were discussed, and she wishes to proceed.

## 2014-08-14 NOTE — Progress Notes (Signed)
HPI Tracey [name] is referred today to consider insertion of an implantable loop recorder. She is a very pleasant 81 year old woman, with a history of stroke approximately 11 years ago. Approximately 3 months ago, she had a TIA. Her stroke was complicated by residual left hemiparesis which is largely recovered with some mild persistent weakness, and her TIA of course is resolved completely. The patient does not have a history of atrial fibrillation. She denies palpitations or other cardiac arrhythmia symptoms. She is fairly sedentary, but denies chest pain or shortness of breath. Allergies  Allergen Reactions  . Alendronate Sodium   . Simvastatin      Current Outpatient Prescriptions  Medication Sig Dispense Refill  . acetaminophen-codeine (TYLENOL #3) 300-30 MG per tablet Take 1 tablet by mouth every 8 (eight) hours as needed for moderate pain.    Marland Kitchen. aspirin 81 MG tablet Take 81 mg by mouth daily.    . calcium-vitamin D (OSCAL WITH D) 500-200 MG-UNIT per tablet Take 1 tablet by mouth daily with breakfast.    . Cranberry 450 MG CAPS Take 1 tablet by mouth daily.    . diphenhydrAMINE (SOMINEX) 25 MG tablet Take 25 mg by mouth at bedtime as needed for itching or sleep.    Marland Kitchen. donepezil (ARICEPT) 10 MG tablet Take 10 mg by mouth at bedtime.    . feeding supplement (BOOST HIGH PROTEIN) LIQD Take 1 Container by mouth 2 (two) times daily between meals.    . hydrocortisone 2.5 % cream Apply 1 application topically 2 (two) times daily.    . Lidocaine-Prilocaine, Bulk, 2.5-2.5 % CREA Apply 1 application topically 2 (two) times daily.    Marland Kitchen. loratadine (CLARITIN) 10 MG tablet Take 10 mg by mouth daily.    . Melatonin 3 MG CAPS Take 3 mg by mouth at bedtime.    . Omega-3 Fatty Acids (FISH OIL) 1000 MG CAPS Take 1,000 mg by mouth daily.    . polyethylene glycol (MIRALAX / GLYCOLAX) packet Take 17 g by mouth daily as needed for mild constipation.     No current facility-administered medications for  this visit.     Past Medical History  Diagnosis Date  . Memory deficit 10/17/2012  . Abnormality of gait 10/17/2012  . Stroke     Mild left hemiparesis  . Urinary incontinence   . Lumbosacral spondylosis   . Hypertension   . Dyslipidemia   . Spinal stenosis   . Alzheimer disease   . Hemiparesis and alteration of sensations as late effects of stroke 06/24/2014    ROS:   All systems reviewed and negative except as noted in the HPI.   Past Surgical History  Procedure Laterality Date  . Tubal ligation Bilateral   . Cataract extraction Bilateral      Family History  Problem Relation Age of Onset  . Parkinsonism Brother   . Dementia Brother   . Heart disease Brother      History   Social History  . Marital Status: Single    Spouse Name: N/A  . Number of Children: 2  . Years of Education: 16   Occupational History  . Retired Charity fundraiserN    Social History Main Topics  . Smoking status: Never Smoker   . Smokeless tobacco: Never Used  . Alcohol Use: 0.6 oz/week    1 Glasses of wine per week     Comment: 1 glass of wine per day  . Drug Use: No  . Sexual Activity:  Not on file   Other Topics Concern  . Not on file   Social History Narrative     BP 120/58 mmHg  Pulse 97  Ht 5\' 2"  (1.575 m)  Wt 117 lb 3.2 oz (53.162 kg)  BMI 21.43 kg/m2  Physical Exam:  Well appearing elderly woman, NAD HEENT: Unremarkable Neck:  6 cm JVD, no thyromegally Back:  No CVA tenderness Lungs:  Clear, with no wheezes, rales, or rhonchi. HEART:  Regular rate rhythm, no murmurs, no rubs, no clicks Abd:  soft, positive bowel sounds, no organomegally, no rebound, no guarding Ext:  2 plus pulses, no edema, no cyanosis, no clubbing Skin:  No rashes no nodules Neuro:  CN II through XII intact, motor grossly intact    Assess/Plan:

## 2014-08-14 NOTE — Assessment & Plan Note (Signed)
She will continue her current medications including fish oil. She will maintain a low-fat diet.

## 2014-08-14 NOTE — Patient Instructions (Signed)
Medication Instructions:  Your physician recommends that you continue on your current medications as directed. Please refer to the Current Medication list given to you today.   Labwork: None ordered  Testing/Procedures: LINQ implant to be scheduled  Days : 5/6, 5/9, 5/11, 5/16, 5/23, 5/25, 6/2, 6/8, 6/10, 6/14, 6/16, 6/22, 6/29,   Follow-Up: Will schedule follow up after LINQ is scheduled  Any Other Special Instructions Will Be Listed Below (If Applicable).

## 2014-08-14 NOTE — Assessment & Plan Note (Signed)
Her symptoms are fairly mild. No changes to therapy.

## 2014-08-18 ENCOUNTER — Telehealth: Payer: Self-pay | Admitting: Internal Medicine

## 2014-08-18 NOTE — Telephone Encounter (Signed)
New message        Pt is [facility]iring about May 23rd [facility]

## 2014-08-19 NOTE — Telephone Encounter (Signed)
Spoke with Chip BoerVicki and have the LINQ scheduled for 5/23 at 3pm  Patient to be there at 2pm.  She is aware no special instructions and to arrive at Short Stay Main Entrance at 2pm

## 2014-08-29 ENCOUNTER — Ambulatory Visit: Payer: Medicare Other | Admitting: Neurology

## 2014-09-02 ENCOUNTER — Other Ambulatory Visit: Payer: Self-pay | Admitting: *Deleted

## 2014-09-08 ENCOUNTER — Ambulatory Visit (HOSPITAL_COMMUNITY)
Admission: RE | Admit: 2014-09-08 | Discharge: 2014-09-08 | Disposition: A | Discharge status: Home / Self Care | Payer: Medicare Other | Source: Ambulatory Visit | Attending: Internal Medicine | Admitting: Internal Medicine

## 2014-09-08 ENCOUNTER — Encounter (HOSPITAL_COMMUNITY)
Admission: RE | Disposition: A | Discharge status: Home / Self Care | Payer: Medicare Other | Source: Ambulatory Visit | Attending: Internal Medicine

## 2014-09-08 DIAGNOSIS — I10 Essential (primary) hypertension: Secondary | ICD-10-CM | POA: Insufficient documentation

## 2014-09-08 DIAGNOSIS — Z9851 Tubal ligation status: Secondary | ICD-10-CM | POA: Diagnosis not present

## 2014-09-08 DIAGNOSIS — M47897 Other spondylosis, lumbosacral region: Secondary | ICD-10-CM | POA: Diagnosis not present

## 2014-09-08 DIAGNOSIS — Z7982 Long term (current) use of aspirin: Secondary | ICD-10-CM | POA: Insufficient documentation

## 2014-09-08 DIAGNOSIS — G309 Alzheimer's disease, unspecified: Secondary | ICD-10-CM | POA: Diagnosis not present

## 2014-09-08 DIAGNOSIS — I69354 Hemiplegia and hemiparesis following cerebral infarction affecting left non-dominant side: Secondary | ICD-10-CM | POA: Diagnosis not present

## 2014-09-08 DIAGNOSIS — E785 Hyperlipidemia, unspecified: Secondary | ICD-10-CM | POA: Diagnosis not present

## 2014-09-08 DIAGNOSIS — Z8673 Personal history of transient ischemic attack (TIA), and cerebral infarction without residual deficits: Secondary | ICD-10-CM | POA: Insufficient documentation

## 2014-09-08 DIAGNOSIS — M4800 Spinal stenosis, site unspecified: Secondary | ICD-10-CM | POA: Diagnosis not present

## 2014-09-08 DIAGNOSIS — Z888 Allergy status to other drugs, medicaments and biological substances status: Secondary | ICD-10-CM | POA: Insufficient documentation

## 2014-09-08 DIAGNOSIS — I639 Cerebral infarction, unspecified: Secondary | ICD-10-CM | POA: Diagnosis not present

## 2014-09-08 HISTORY — PX: EP IMPLANTABLE DEVICE: SHX172B

## 2014-09-08 SURGERY — LOOP RECORDER INSERTION

## 2014-09-08 MED ORDER — LIDOCAINE-EPINEPHRINE 1 %-1:100000 IJ SOLN
INTRAMUSCULAR | Status: AC
  Filled 2014-09-08: qty 1

## 2014-09-08 SURGICAL SUPPLY — 2 items
LOOP REVEAL LINQSYS (Prosthesis & Implant Heart) ×3 IMPLANT
PACK LOOP INSERTION (CUSTOM PROCEDURE TRAY) ×3 IMPLANT

## 2014-09-08 NOTE — H&P (View-Only) (Signed)
    HPI Tracey [name] is referred today to consider insertion of an implantable loop recorder. She is a very pleasant 81-year-old woman, with a history of stroke approximately 11 years ago. Approximately 3 months ago, she had a TIA. Her stroke was complicated by residual left hemiparesis which is largely recovered with some mild persistent weakness, and her TIA of course is resolved completely. The patient does not have a history of atrial fibrillation. She denies palpitations or other cardiac arrhythmia symptoms. She is fairly sedentary, but denies chest pain or shortness of breath. Allergies  Allergen Reactions  . Alendronate Sodium   . Simvastatin      Current Outpatient Prescriptions  Medication Sig Dispense Refill  . acetaminophen-codeine (TYLENOL #3) 300-30 MG per tablet Take 1 tablet by mouth every 8 (eight) hours as needed for moderate pain.    . aspirin 81 MG tablet Take 81 mg by mouth daily.    . calcium-vitamin D (OSCAL WITH D) 500-200 MG-UNIT per tablet Take 1 tablet by mouth daily with breakfast.    . Cranberry 450 MG CAPS Take 1 tablet by mouth daily.    . diphenhydrAMINE (SOMINEX) 25 MG tablet Take 25 mg by mouth at bedtime as needed for itching or sleep.    . donepezil (ARICEPT) 10 MG tablet Take 10 mg by mouth at bedtime.    . feeding supplement (BOOST HIGH PROTEIN) LIQD Take 1 Container by mouth 2 (two) times daily between meals.    . hydrocortisone 2.5 % cream Apply 1 application topically 2 (two) times daily.    . Lidocaine-Prilocaine, Bulk, 2.5-2.5 % CREA Apply 1 application topically 2 (two) times daily.    . loratadine (CLARITIN) 10 MG tablet Take 10 mg by mouth daily.    . Melatonin 3 MG CAPS Take 3 mg by mouth at bedtime.    . Omega-3 Fatty Acids (FISH OIL) 1000 MG CAPS Take 1,000 mg by mouth daily.    . polyethylene glycol (MIRALAX / GLYCOLAX) packet Take 17 g by mouth daily as needed for mild constipation.     No current facility-administered medications for  this visit.     Past Medical History  Diagnosis Date  . Memory deficit 10/17/2012  . Abnormality of gait 10/17/2012  . Stroke     Mild left hemiparesis  . Urinary incontinence   . Lumbosacral spondylosis   . Hypertension   . Dyslipidemia   . Spinal stenosis   . Alzheimer disease   . Hemiparesis and alteration of sensations as late effects of stroke 06/24/2014    ROS:   All systems reviewed and negative except as noted in the HPI.   Past Surgical History  Procedure Laterality Date  . Tubal ligation Bilateral   . Cataract extraction Bilateral      Family History  Problem Relation Age of Onset  . Parkinsonism Brother   . Dementia Brother   . Heart disease Brother      History   Social History  . Marital Status: Single    Spouse Name: N/A  . Number of Children: 2  . Years of Education: 16   Occupational History  . Retired RN    Social History Main Topics  . Smoking status: Never Smoker   . Smokeless tobacco: Never Used  . Alcohol Use: 0.6 oz/week    1 Glasses of wine per week     Comment: 1 glass of wine per day  . Drug Use: No  . Sexual Activity:   Not on file   Other Topics Concern  . Not on file   Social History Narrative     BP 120/58 mmHg  Pulse 97  Ht 5\' 2"  (1.575 m)  Wt 117 lb 3.2 oz (53.162 kg)  BMI 21.43 kg/m2  Physical Exam:  Well appearing elderly woman, NAD HEENT: Unremarkable Neck:  6 cm JVD, no thyromegally Back:  No CVA tenderness Lungs:  Clear, with no wheezes, rales, or rhonchi. HEART:  Regular rate rhythm, no murmurs, no rubs, no clicks Abd:  soft, positive bowel sounds, no organomegally, no rebound, no guarding Ext:  2 plus pulses, no edema, no cyanosis, no clubbing Skin:  No rashes no nodules Neuro:  CN II through XII intact, motor grossly intact    Assess/Plan:

## 2014-09-08 NOTE — Interval H&P Note (Signed)
History and Physical Interval Note:  09/08/2014 3:18 PM  Chapman MossLou Humbarger  has presented today for surgery, with the diagnosis of stroke  The various methods of treatment have been discussed with the patient and family. After consideration of risks, benefits and other options for treatment, the patient has consented to  Procedure(s): Loop Recorder Insertion (N/A) as a surgical intervention .  The patient's history has been reviewed, patient examined, no change in status, stable for surgery.  I have reviewed the patient's chart and labs.  Questions were answered to the patient's satisfaction.     Leonia ReevesGregg Taylor,M.D.

## 2014-09-08 NOTE — CV Procedure (Signed)
Electrophysiology procedure note  Procedure performed: Insertion of an implantable loop recorder  Preprocedure diagnosis: cryptogenic stroke  Postprocedure diagnosis: Same as preprocedure diagnosis  Description of the procedure: After informed consent was obtained, the patient was prepped and draped in the usual manner. 20 cc of lidocaine was infiltrated into the left pectoral region. A 1 cm stab incision was carried out. The Medtronic implantable loop recorder, serial number [id number] was inserted. The R-wave measures 0.5 mV. Benzoin and Steri-Strips her pain on the skin. The patient was bandaged and recovered in the usual fashion.  Complications: There were no immediate procedure complications  Conclusion: Successful insertion of a Medtronic implantable loop recorder for cryptogenic stroke.  Lewayne BuntingGregg Trenisha Lafavor, M.D

## 2014-09-09 ENCOUNTER — Encounter (HOSPITAL_COMMUNITY): Payer: Self-pay | Admitting: Internal Medicine

## 2014-09-09 ENCOUNTER — Ambulatory Visit (INDEPENDENT_AMBULATORY_CARE_PROVIDER_SITE_OTHER): Payer: Medicare Other | Admitting: Neurology

## 2014-09-09 VITALS — BP 110/70 | HR 67 | Ht 62.0 in | Wt 115.5 lb

## 2014-09-09 DIAGNOSIS — R269 Unspecified abnormalities of gait and mobility: Secondary | ICD-10-CM

## 2014-09-09 DIAGNOSIS — I6340 Cerebral infarction due to embolism of unspecified cerebral artery: Secondary | ICD-10-CM | POA: Diagnosis not present

## 2014-09-09 DIAGNOSIS — Z8673 Personal history of transient ischemic attack (TIA), and cerebral infarction without residual deficits: Secondary | ICD-10-CM | POA: Diagnosis not present

## 2014-09-09 DIAGNOSIS — G214 Vascular parkinsonism: Secondary | ICD-10-CM | POA: Diagnosis not present

## 2014-09-09 MED FILL — Lidocaine Inj 1% w/ Epinephrine-1:100000: INTRAMUSCULAR | Qty: 20 | Status: AC

## 2014-09-09 NOTE — Progress Notes (Signed)
Tracey [name] was seen today in the movement disorders clinic for neurologic consultation at the request of KOIRALA,DIBAS, MD.   She is unaccompanied.   It appears that her daughter requested the c/s but isn't here.   I have reviewed records made available to me.  This is a second opinion consultation.  Pt states "I don't know why I am here."   The patient has been seeing Dr. Anne HahnWillis and I reviewed those records.  She first saw him in July, 2014 when she presented with a shuffling, wide based gait and memory change.  She was diagnosed with "dementia" and started on Aricept at that time.  She did not follow back up with Dr. Anne HahnWillis until March, 2016.  This was a posthospitalization follow-up.  She was hospitalized in January, 2016 after she fell getting out of bed.  She apparently has chronic, mild left-sided weakness at baseline ever since a stroke years ago but felt that the left-sided weakness had increased some in the days preceding the fall.  She was admitted to the hospital.  The workup was essentially unremarkable.  She remembers none of this when questioned about it today.  She had an MRA of the brain that was negative.  The MRI of the brain did not show anything acute.  There were remote bilateral frontal infarcts in the MCA territory.  There was severe small vessel disease and severe atrophy.  I personally reviewed those films.  She followed up with Dr. Anne HahnWillis.  She had a carotid ultrasound through their office on 07/10/2014 that demonstrated 50-60% stenosis in the right internal carotid artery.  [facility] that was read as normal.  Specific Symptoms:  Tremor: No. Voice: no change Sleep: sleeps well  Vivid Dreams:  No.  Acting out dreams:  unknown Wet Pillows: No. Postural symptoms:  Yes.    Falls?  Denies but hospital records indicate that she was there in Jan after a fall Bradykinesia symptoms: slow movements and difficulty getting out of a chair Loss of smell:   No. Loss of taste:  No. Urinary Incontinence:  Yes.   Difficulty Swallowing:  No. Handwriting, micrographia: Yes.   (trouble holding pen now as well) Trouble with ADL's:  No., denies getting any help  Trouble buttoning clothing: No. Depression:  Yes.   (states that it is due to knee pain) Memory changes:  No. (denies it but on aricept and has dx of dementia) Hallucinations:  No.  visual distortions: No. N/V:  No. Lightheaded:  No.  Syncope: No. Diplopia:  No. Dyskinesia:  No.  PREVIOUS MEDICATIONS: none to date  ALLERGIES:   Allergies  Allergen Reactions  . Alendronate Sodium   . Simvastatin     CURRENT MEDICATIONS:  Outpatient Encounter Prescriptions as of 09/09/2014  Medication Sig  . acetaminophen-codeine (TYLENOL #3) 300-30 MG per tablet Take 1 tablet by mouth every 8 (eight) hours as needed for moderate pain.  Marland Kitchen. aspirin 81 MG tablet Take 81 mg by mouth daily.  . calcium-vitamin D (OSCAL WITH D) 500-200 MG-UNIT per tablet Take 1 tablet by mouth daily with breakfast.  . Cranberry 450 MG CAPS Take 1 tablet by mouth daily.  . diphenhydrAMINE (SOMINEX) 25 MG tablet Take 25 mg by mouth at bedtime as needed for itching or sleep.  Marland Kitchen. donepezil (ARICEPT) 10 MG tablet Take 10 mg by mouth at bedtime.  . feeding supplement (BOOST HIGH PROTEIN) LIQD Take 1 Container by mouth 2 (two)  times daily between meals.  . hydrocortisone 2.5 % cream Apply 1 application topically 2 (two) times daily.  . Lidocaine-Prilocaine, Bulk, 2.5-2.5 % CREA Apply 1 application topically 2 (two) times daily.  Marland Kitchen loratadine (CLARITIN) 10 MG tablet Take 10 mg by mouth daily.  . Melatonin 3 MG CAPS Take 3 mg by mouth at bedtime.  . Omega-3 Fatty Acids (FISH OIL) 1000 MG CAPS Take 1,000 mg by mouth daily.  . polyethylene glycol (MIRALAX / GLYCOLAX) packet Take 17 g by mouth daily as needed for mild constipation.   No facility-administered encounter medications on file as of 09/09/2014.    PAST MEDICAL HISTORY:    Past Medical History  Diagnosis Date  . Memory deficit 10/17/2012  . Abnormality of gait 10/17/2012  . Stroke     Mild left hemiparesis  . Urinary incontinence   . Lumbosacral spondylosis   . Hypertension   . Dyslipidemia   . Spinal stenosis   . Alzheimer disease   . Hemiparesis and alteration of sensations as late effects of stroke 06/24/2014    PAST SURGICAL HISTORY:   Past Surgical History  Procedure Laterality Date  . Tubal ligation Bilateral   . Cataract extraction Bilateral   . Ep implantable device N/A 09/08/2014    Procedure: Loop Recorder Insertion;  Surgeon: Marinus Maw, MD;  Location: [facility] INV[facility]IVE CV [facility];  Service: Cardiovascular;  Laterality: N/A;    SOCIAL HISTORY:   History   Social History  . Marital Status: Single    Spouse Name: N/A  . Number of Children: 2  . Years of Education: 16   Occupational History  . Retired Charity fundraiser    Social History Main Topics  . Smoking status: Never Smoker   . Smokeless tobacco: Never Used  . Alcohol Use: 0.6 oz/week    1 Glasses of wine per week     Comment: 1 glass of wine per day  . Drug Use: No  . Sexual Activity: Not on file   Other Topics Concern  . Not on file   Social History Narrative   Lives alone in one story home.  Has 2 children.  Retired Charity fundraiser.      FAMILY HISTORY:   Family Status  Relation Status Death Age  . Mother Deceased 2's    Unknown cause of death  . Father Deceased 39's    Peripheral vascular disease  . Brother Alive   . Brother Alive   . Brother Deceased     ROS:  She does report right knee pain.  She also reports that she had a loop recorder inserted yesterday and the incision is still very tender.  A complete 10 system review of systems was obtained and was unremarkable apart from what is mentioned above.  PHYSICAL EXAMINATION:    VITALS:   Filed Vitals:   09/09/14 1254  BP: 110/70  Pulse: 67  Height:  (1.575 m)  Weight: 115 lb 8 oz (52.39 kg)  SpO2: 93%    GEN:  The  patient appears stated age and is in NAD.  She is unfriendly, obviously frustrated that she is here and only somewhat cooperative with the examination. HEENT:  Normocephalic, atraumatic.  The mucous membranes are moist. The superficial temporal arteries are without ropiness or tenderness. CV:  RRR Lungs:  CTAB Neck/HEME:  There are no carotid bruits bilaterally.  Neurological examination:  Orientation: The patient refuses to answer questions of orientation.  She does state that it is March (it  is the end of May).  She refuses to guess on the date, day or year.  She scores a 1/4 on the clock drawing.  She only gets the circle correct. Sensation: Sensation is intact to light and pinprick throughout (facial, trunk, extremities). Vibration is decreased at the bilateral big toe. There is no extinction with double simultaneous stimulation. There is no sensory dermatomal level identified. Motor: Strength is 5/5 in the bilateral upper extremity and left lower extremity.  She does not participate with manual motor testing in the right lower extremity because of knee pain, but is able to lift it up antigravity.   Shoulder shrug is equal and symmetric.  There is no pronator drift. Deep tendon reflexes: Deep tendon reflexes are 2-/4 at the bilateral biceps, triceps, brachioradialis, left patella, she refuses testing at the right patella because of pain and they're absent at the bilateral achilles. Plantar responses are downgoing bilaterally.  Movement examination: Tone: There is normal tone in the bilateral upper extremities but she does have a component of gegenhalten bilaterally.  The tone in the lower extremities is normal.  Abnormal movements: None Coordination:  There is no significant decremation with RAM's, if any form of rapid alternating movements, including alternating supination and pronation of the forearm, hand opening and closing, finger taps, heel taps and toe taps. Gait and Station: The patient  refuses to try to get up without the use of her hands.  She will not walk without the walker.  With the walker, she is short stepped.  She slightly drags the left leg and does turn en bloc.  She has slight hesitation in the doorway and also when she gets back to her seat.  ASSESSMENT/PLAN:  1.  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  Her MRI of the brain would certainly favor this, as well as the fact that she presented several years ago with shuffling gait.  She has no evidence of idiopathic Parkinson's disease.  Given significant memory change, I would not try her on levodopa.  Levodopa is only helpful in about a 30% of patients with vascular parkinsonism.  Talked to her about the importance of therapy, which she states that she is doing. 2.  Dementia, likely vascular in nature  -She is on Aricept.  She is in a subacute nursing facility where she receives 24-hour per day care. 3.  History of cerebral infarction  -She is being followed by Dr. Anne Hahn and had a loop recorder implanted yesterday. 4.  Follow-up with Dr. Anne Hahn.

## 2014-09-25 ENCOUNTER — Encounter: Payer: Self-pay | Admitting: Internal Medicine

## 2014-09-25 ENCOUNTER — Ambulatory Visit (INDEPENDENT_AMBULATORY_CARE_PROVIDER_SITE_OTHER): Payer: Medicare Other | Admitting: *Deleted

## 2014-09-25 DIAGNOSIS — I6340 Cerebral infarction due to embolism of unspecified cerebral artery: Secondary | ICD-10-CM

## 2014-09-25 LAB — CUP PACEART INCLINIC DEVICE CHECK
Date Time Interrogation Session: 20160609132322
MDC IDC SET ZONE DETECTION INTERVAL: 3000 ms
MDC IDC SET ZONE DETECTION INTERVAL: 410 ms
Zone Setting Detection Interval: 2000 ms

## 2014-09-25 NOTE — Progress Notes (Signed)
ILR Wound check appointment. Steri-strips removed prior to arrival. Wound without redness or edema. Incision edges approximated, wound well healed. Pt with 0 tachy episodes; 0 brady episodes; 17 asystole. Episodes were appropriate, pt asymptomatic as pauses were occurring during appointment. [facility] summary reports QMO & ROV w/ GT in 53yr.

## 2014-10-08 ENCOUNTER — Encounter: Payer: Self-pay | Admitting: Internal Medicine

## 2014-10-08 ENCOUNTER — Ambulatory Visit (INDEPENDENT_AMBULATORY_CARE_PROVIDER_SITE_OTHER): Payer: Medicare Other | Admitting: *Deleted

## 2014-10-08 DIAGNOSIS — I6340 Cerebral infarction due to embolism of unspecified cerebral artery: Secondary | ICD-10-CM | POA: Diagnosis not present

## 2014-10-09 NOTE — Progress Notes (Signed)
Loop recorder 

## 2014-10-14 LAB — CUP PACEART REMOTE DEVICE CHECK: Date Time Interrogation Session: 20160628112956

## 2014-10-26 ENCOUNTER — Encounter: Payer: Self-pay | Admitting: Internal Medicine

## 2014-11-07 ENCOUNTER — Ambulatory Visit (INDEPENDENT_AMBULATORY_CARE_PROVIDER_SITE_OTHER): Payer: Medicare Other | Admitting: *Deleted

## 2014-11-07 ENCOUNTER — Encounter: Payer: Self-pay | Admitting: Internal Medicine

## 2014-11-07 DIAGNOSIS — I6340 Cerebral infarction due to embolism of unspecified cerebral artery: Secondary | ICD-10-CM | POA: Diagnosis not present

## 2014-11-09 ENCOUNTER — Encounter: Payer: Self-pay | Admitting: Internal Medicine

## 2014-11-25 NOTE — Progress Notes (Signed)
Loop recorder 

## 2014-12-01 LAB — CUP PACEART REMOTE DEVICE CHECK
Date Time Interrogation Session: 20160724173719
MDC IDC SET ZONE DETECTION INTERVAL: 2000 ms
MDC IDC SET ZONE DETECTION INTERVAL: 3000 ms
Zone Setting Detection Interval: 410 ms

## 2014-12-08 ENCOUNTER — Encounter: Payer: Self-pay | Admitting: Internal Medicine

## 2014-12-08 ENCOUNTER — Ambulatory Visit (INDEPENDENT_AMBULATORY_CARE_PROVIDER_SITE_OTHER): Payer: Medicare Other | Admitting: *Deleted

## 2014-12-08 DIAGNOSIS — I6340 Cerebral infarction due to embolism of unspecified cerebral artery: Secondary | ICD-10-CM | POA: Diagnosis not present

## 2014-12-10 NOTE — Progress Notes (Signed)
Loop recorder 

## 2014-12-11 ENCOUNTER — Inpatient Hospital Stay (HOSPITAL_COMMUNITY)
Admission: EM | Admit: 2014-12-11 | Discharge: 2014-12-13 | DRG: 690 | Disposition: A | Discharge status: Home / Self Care | Payer: Medicare Other | Attending: Internal Medicine | Admitting: Internal Medicine

## 2014-12-11 ENCOUNTER — Encounter (HOSPITAL_COMMUNITY): Payer: Self-pay | Admitting: Family Medicine

## 2014-12-11 DIAGNOSIS — I10 Essential (primary) hypertension: Secondary | ICD-10-CM | POA: Diagnosis present

## 2014-12-11 DIAGNOSIS — M4806 Spinal stenosis, lumbar region: Secondary | ICD-10-CM | POA: Diagnosis present

## 2014-12-11 DIAGNOSIS — M13862 Other specified arthritis, left knee: Secondary | ICD-10-CM | POA: Diagnosis present

## 2014-12-11 DIAGNOSIS — E785 Hyperlipidemia, unspecified: Secondary | ICD-10-CM | POA: Diagnosis present

## 2014-12-11 DIAGNOSIS — M47817 Spondylosis without myelopathy or radiculopathy, lumbosacral region: Secondary | ICD-10-CM | POA: Diagnosis present

## 2014-12-11 DIAGNOSIS — M13861 Other specified arthritis, right knee: Secondary | ICD-10-CM | POA: Diagnosis present

## 2014-12-11 DIAGNOSIS — B9620 Unspecified Escherichia coli [E. coli] as the cause of diseases classified elsewhere: Secondary | ICD-10-CM | POA: Diagnosis present

## 2014-12-11 DIAGNOSIS — G309 Alzheimer's disease, unspecified: Secondary | ICD-10-CM | POA: Diagnosis present

## 2014-12-11 DIAGNOSIS — Z79899 Other long term (current) drug therapy: Secondary | ICD-10-CM | POA: Diagnosis not present

## 2014-12-11 DIAGNOSIS — F0280 Dementia in other diseases classified elsewhere without behavioral disturbance: Secondary | ICD-10-CM | POA: Diagnosis present

## 2014-12-11 DIAGNOSIS — Z66 Do not resuscitate: Secondary | ICD-10-CM | POA: Diagnosis not present

## 2014-12-11 DIAGNOSIS — Z888 Allergy status to other drugs, medicaments and biological substances status: Secondary | ICD-10-CM

## 2014-12-11 DIAGNOSIS — K5900 Constipation, unspecified: Secondary | ICD-10-CM | POA: Diagnosis not present

## 2014-12-11 DIAGNOSIS — G3183 Dementia with Lewy bodies: Secondary | ICD-10-CM | POA: Diagnosis present

## 2014-12-11 DIAGNOSIS — R627 Adult failure to thrive: Secondary | ICD-10-CM | POA: Diagnosis present

## 2014-12-11 DIAGNOSIS — I455 Other specified heart block: Secondary | ICD-10-CM | POA: Diagnosis not present

## 2014-12-11 DIAGNOSIS — Z7982 Long term (current) use of aspirin: Secondary | ICD-10-CM

## 2014-12-11 DIAGNOSIS — N390 Urinary tract infection, site not specified: Secondary | ICD-10-CM | POA: Diagnosis present

## 2014-12-11 DIAGNOSIS — B958 Unspecified staphylococcus as the cause of diseases classified elsewhere: Secondary | ICD-10-CM | POA: Diagnosis present

## 2014-12-11 DIAGNOSIS — I69354 Hemiplegia and hemiparesis following cerebral infarction affecting left non-dominant side: Secondary | ICD-10-CM

## 2014-12-11 DIAGNOSIS — R001 Bradycardia, unspecified: Secondary | ICD-10-CM | POA: Diagnosis present

## 2014-12-11 DIAGNOSIS — Z8673 Personal history of transient ischemic attack (TIA), and cerebral infarction without residual deficits: Secondary | ICD-10-CM | POA: Diagnosis not present

## 2014-12-11 HISTORY — DX: Spinal stenosis, lumbar region without neurogenic claudication: M48.061

## 2014-12-11 HISTORY — DX: Anemia, unspecified: D64.9

## 2014-12-11 HISTORY — DX: Unspecified osteoarthritis, unspecified site: M19.90

## 2014-12-11 LAB — COMPREHENSIVE METABOLIC PANEL
ALK PHOS: 60 U/L (ref 38–126)
ALT: 26 U/L (ref 14–54)
AST: 24 U/L (ref 15–41)
Albumin: 4 g/dL (ref 3.5–5.0)
Anion gap: 9 (ref 5–15)
BILIRUBIN TOTAL: 0.5 mg/dL (ref 0.3–1.2)
BUN: 21 mg/dL — ABNORMAL HIGH (ref 6–20)
CALCIUM: 9.9 mg/dL (ref 8.9–10.3)
CO2: 26 mmol/L (ref 22–32)
CREATININE: 1.13 mg/dL — AB (ref 0.44–1.00)
Chloride: 101 mmol/L (ref 101–111)
GFR, EST AFRICAN AMERICAN: 51 mL/min — AB (ref >60)
GFR, EST NON AFRICAN AMERICAN: 44 mL/min — AB (ref >60)
Glucose, Bld: 124 mg/dL — ABNORMAL HIGH (ref 65–99)
Potassium: 4.5 mmol/L (ref 3.5–5.1)
Sodium: 136 mmol/L (ref 135–145)
TOTAL PROTEIN: 6.8 g/dL (ref 6.5–8.1)

## 2014-12-11 LAB — URINALYSIS, ROUTINE W REFLEX MICROSCOPIC
Bilirubin Urine: NEGATIVE
GLUCOSE, UA: NEGATIVE mg/dL
Hgb urine dipstick: NEGATIVE
KETONES UR: NEGATIVE mg/dL
NITRITE: NEGATIVE
PROTEIN: NEGATIVE mg/dL
Specific Gravity, Urine: 1.005 (ref 1.005–1.030)
Urobilinogen, UA: 0.2 mg/dL (ref 0.0–1.0)
pH: 6 (ref 5.0–8.0)

## 2014-12-11 LAB — CBC
HCT: 35.1 % — ABNORMAL LOW (ref 36.0–46.0)
Hemoglobin: 11.7 g/dL — ABNORMAL LOW (ref 12.0–15.0)
MCH: 31.1 pg (ref 26.0–34.0)
MCHC: 33.3 g/dL (ref 30.0–36.0)
MCV: 93.4 fL (ref 78.0–100.0)
PLATELETS: 280 10*3/uL (ref 150–400)
RBC: 3.76 MIL/uL — AB (ref 3.87–5.11)
RDW: 14.1 % (ref 11.5–15.5)
WBC: 7.7 10*3/uL (ref 4.0–10.5)

## 2014-12-11 LAB — URINE MICROSCOPIC-ADD ON

## 2014-12-11 LAB — LIPASE, BLOOD: Lipase: 59 U/L — ABNORMAL HIGH (ref 22–51)

## 2014-12-11 MED ORDER — SODIUM CHLORIDE 0.9 % IV BOLUS (SEPSIS)
1000.0000 mL | Freq: Once | INTRAVENOUS | Status: AC
  Administered 2014-12-11: 1000 mL via INTRAVENOUS

## 2014-12-11 MED ORDER — DEXTROSE 5 % IV SOLN
1.0000 g | Freq: Once | INTRAVENOUS | Status: AC
  Administered 2014-12-11: 1 g via INTRAVENOUS
  Filled 2014-12-11: qty 10

## 2014-12-11 NOTE — ED Notes (Signed)
IV attempted x2 without success.

## 2014-12-11 NOTE — ED Provider Notes (Signed)
History   Chief Complaint  Patient presents with  . Diarrhea  . Nausea    HPI 81 year old female past history is below notable for Alzheimer's, history of stroke with residual left sided deficits, hypertension who presents ED, the by her daughter who is also providing history today for evaluation of persistent nausea and failure to thrive. Daughter states patient lives in assisted living and over the past 1-1/2 weeks has had numerous episodes of diarrhea which has recently resolved. Since then patient has had persistent nausea as well as she has been mainly bedbound and not wanting to eat. She states this is atypical for her. No report of fevers, chills, cough, chest pain, shortness of breath, urinary symptoms. Daughter states patient is not eating well at all. Patient denies any cardiac history. No other complaints at this time.  Past medical/surgical history, social history, medications, allergies and FH have been reviewed with patient and/or in documentation. Furthermore, if pt family or friend(s) present, additional historical information was obtained from them.  Past Medical History  Diagnosis Date  . Memory deficit 10/17/2012  . Abnormality of gait 10/17/2012  . Stroke     Mild left hemiparesis  . Urinary incontinence   . Lumbosacral spondylosis   . Hypertension   . Dyslipidemia   . Spinal stenosis   . Alzheimer disease   . Hemiparesis and alteration of sensations as late effects of stroke 06/24/2014   Past Surgical History  Procedure Laterality Date  . Tubal ligation Bilateral   . Cataract extraction Bilateral   . Ep implantable device N/A 09/08/2014    Procedure: Loop Recorder Insertion;  Surgeon: Marinus Maw, MD;  Location: [facility];  Service: Cardiovascular;  Laterality: N/A;   Family History  Problem Relation Age of Onset  . Parkinsonism Brother   . Dementia Brother   . Heart disease Brother    Social History  Substance Use Topics  . Smoking status: Never  Smoker   . Smokeless tobacco: Never Used  . Alcohol Use: 0.6 oz/week    1 Glasses of wine per week     Comment: 1 glass of wine per day     Review of Systems Constitutional: - F/C, + fatigue. Dec PO intake  HENT: - congestion, -rhinorrhea, -sore throat.   Eyes: - eye pain, -visual disturbance.  Respiratory: - cough, -SOB, -hemoptysis.   Cardiovascular: - CP, -palps.  Gastrointestinal: - Nausea, -vomiting/diarrhea, -abd pain  Genitourinary: - flank pain, -dysuria, -frequency.  Musculoskeletal: - myalgia/arthritis, -joint swelling, -gait abnormality, -back pain, -neck pain/stiffness, -leg pain/swelling.  Skin: - rash/lesion.  Neurological: - focal weakness, -lightheadedness, -dizziness, -numbness, -HA.  All other systems reviewed and are negative.   Physical Exam  Physical Exam  ED Triage Vitals  Enc Vitals Group     BP 12/11/14 1849 138/60 mmHg     Pulse Rate 12/11/14 1849 70     Resp 12/11/14 1849 18     Temp 12/11/14 1849 98 F (36.7 C)     Temp Source 12/11/14 1849 Oral     SpO2 12/11/14 1849 99 %     Weight --      Height --      Head Cir --      Peak Flow --      Pain Score --      Pain Loc --      Pain Edu? --      Excl. in GC? --    Constitutional: Chronically ill appearing  elderly female in no apparent distress. Head: Normocephalic and atraumatic.  Eyes: Extraocular motion intact, no scleral icterus Mouth: slightly dry mucus membranes, OP clear Neck: Supple without meningismus, mass, or overt JVD Respiratory: No respiratory distress. Normal WOB. No w/r/g. CV: RRR, no obvious murmurs.  Pulses +2 and symmetric. Euvolemic Abdomen: Soft, NT, ND, no r/g. No mass.  MSK: Extremities are atraumatic without deformity, ROM intact Skin: Warm, dry, intact without rash Neuro: HDS, AAOx4. PERRL, EOMI, TML, face sym. CN 2-12 grossly intact. 5/5 sym, no drift, SILT, normal coordination.   ED Course  Procedures   Labs Reviewed  LIPASE, BLOOD - Abnormal; Notable for  the following:    Lipase 59 (*)    All other components within normal limits  COMPREHENSIVE METABOLIC PANEL - Abnormal; Notable for the following:    Glucose, Bld 124 (*)    BUN 21 (*)    Creatinine, Ser 1.13 (*)    GFR calc non Af Amer 44 (*)    GFR calc Af Amer 51 (*)    All other components within normal limits  CBC - Abnormal; Notable for the following:    RBC 3.76 (*)    Hemoglobin 11.7 (*)    HCT 35.1 (*)    All other components within normal limits  URINALYSIS, ROUTINE W REFLEX MICROSCOPIC (NOT AT [facility]) - Abnormal; Notable for the following:    Leukocytes, UA MODERATE (*)    All other components within normal limits  URINE MICROSCOPIC-ADD ON - Abnormal; Notable for the following:    Bacteria, UA MANY (*)    All other components within normal limits  URINE CULTURE   I personally reviewed and interpreted all labs.  No results found. I personally viewed above image(s) which were used in my medical decision making. Formal interpretations by Radiology.   EKG Interpretation  Date/Time:  Thursday December 11 2014 20:32:06 EDT Ventricular Rate:  73 PR Interval:  65 QRS Duration: 90 QT Interval:  457 QTC Calculation: 504 R Axis:   -3 Text Interpretation:  Sinus rhythm Atrial premature complexes Baseline wander in lead(s) I II aVR Confirmed by Denton Lank  MD, Caryn Bee ([id number]) on 12/11/2014 8:50:23 PM       MDM: Deshonna Trnka is a 81 y.o. female with H&P as above who p/w CC: Nausea, diarrhea  On arrival, patient is hemodynamically stable and in no apparent distress. Patient does appear mildly dehydrated. Otherwise has a benign exam. Clinical picture is concerning for failure to thrive. Screening labs were sent for further evaluation.  Workup today is notable for UTI and slight bump in patient's creatinine. Additionally she has a slight bump in her lipase time. Patient received IV fluids and Rocephin in the ED. She remained stable. Given her decline in functional status in the setting of  a UTI with failure to thrive patient will benefit from admission to hospitalist.  Old records reviewed (if available). Labs and imaging reviewed personally by myself and considered in medical decision making if ordered.  Clinical Impression: 1. FTT (failure to thrive) in adult   2. UTI (lower urinary tract infection)     Disposition: Admit  Condition: stable  I have discussed the results, Dx and Tx plan with the pt(& family if present). He/she/they expressed understanding and agree(s) with the plan.  Pt seen in conjunction with Dr. Cathren Laine, MD  Ames Dura, DO [facility] Emergency Medicine Resident - PGY-3     Ames Dura, MD 12/11/14 6045  Cathren Laine, MD 12/14/14 [phone]

## 2014-12-11 NOTE — ED Notes (Signed)
Pt here for diarrhea and severe nausea. Pt sent here by doctor for abnormal lab work but unsure what is abnormal. Pt not eating.

## 2014-12-11 NOTE — ED Notes (Signed)
Pt unable to void at this time. 

## 2014-12-11 NOTE — ED Notes (Signed)
Pt c/o NVD x 2 weeks. Has been seen at [facility] for same, given imodium and medication for nausea. Pt has not had diarrhea in several days. Has had poor po intake. Pt reports drinking ensures

## 2014-12-12 ENCOUNTER — Encounter (HOSPITAL_COMMUNITY): Payer: Self-pay | Admitting: Internal Medicine

## 2014-12-12 DIAGNOSIS — Z8673 Personal history of transient ischemic attack (TIA), and cerebral infarction without residual deficits: Secondary | ICD-10-CM

## 2014-12-12 DIAGNOSIS — R627 Adult failure to thrive: Secondary | ICD-10-CM

## 2014-12-12 DIAGNOSIS — N390 Urinary tract infection, site not specified: Secondary | ICD-10-CM | POA: Diagnosis present

## 2014-12-12 LAB — MAGNESIUM: Magnesium: 2 mg/dL (ref 1.7–2.4)

## 2014-12-12 LAB — CBC
HCT: 31.3 % — ABNORMAL LOW (ref 36.0–46.0)
Hemoglobin: 10.4 g/dL — ABNORMAL LOW (ref 12.0–15.0)
MCH: 31.1 pg (ref 26.0–34.0)
MCHC: 33.2 g/dL (ref 30.0–36.0)
MCV: 93.7 fL (ref 78.0–100.0)
PLATELETS: 271 10*3/uL (ref 150–400)
RBC: 3.34 MIL/uL — AB (ref 3.87–5.11)
RDW: 14.3 % (ref 11.5–15.5)
WBC: 6.4 10*3/uL (ref 4.0–10.5)

## 2014-12-12 LAB — COMPREHENSIVE METABOLIC PANEL
ALT: 21 U/L (ref 14–54)
AST: 19 U/L (ref 15–41)
Albumin: 3.3 g/dL — ABNORMAL LOW (ref 3.5–5.0)
Alkaline Phosphatase: 52 U/L (ref 38–126)
Anion gap: 7 (ref 5–15)
BUN: 13 mg/dL (ref 6–20)
CHLORIDE: 105 mmol/L (ref 101–111)
CO2: 26 mmol/L (ref 22–32)
CREATININE: 0.95 mg/dL (ref 0.44–1.00)
Calcium: 9 mg/dL (ref 8.9–10.3)
GFR calc Af Amer: >60 mL/min (ref >60)
GFR calc non Af Amer: 54 mL/min — ABNORMAL LOW (ref >60)
Glucose, Bld: 124 mg/dL — ABNORMAL HIGH (ref 65–99)
Potassium: 4.2 mmol/L (ref 3.5–5.1)
SODIUM: 138 mmol/L (ref 135–145)
Total Bilirubin: 0.4 mg/dL (ref 0.3–1.2)
Total Protein: 5.8 g/dL — ABNORMAL LOW (ref 6.5–8.1)

## 2014-12-12 LAB — PHOSPHORUS: PHOSPHORUS: 3.1 mg/dL (ref 2.5–4.6)

## 2014-12-12 LAB — MRSA PCR SCREENING: MRSA BY PCR: NEGATIVE

## 2014-12-12 LAB — TSH: TSH: 0.388 u[IU]/mL (ref 0.350–4.500)

## 2014-12-12 MED ORDER — ENOXAPARIN SODIUM 30 MG/0.3ML ~~LOC~~ SOLN
30.0000 mg | SUBCUTANEOUS | Status: DC
  Administered 2014-12-12: 30 mg via SUBCUTANEOUS
  Filled 2014-12-12: qty 0.3

## 2014-12-12 MED ORDER — HYDROCODONE-ACETAMINOPHEN 5-325 MG PO TABS
1.0000 | ORAL_TABLET | Freq: Two times a day (BID) | ORAL | Status: DC
  Administered 2014-12-12 – 2014-12-13 (×3): 1 via ORAL
  Filled 2014-12-12 (×3): qty 1

## 2014-12-12 MED ORDER — LORATADINE 10 MG PO TABS
10.0000 mg | ORAL_TABLET | Freq: Every day | ORAL | Status: DC
  Administered 2014-12-12 – 2014-12-13 (×2): 10 mg via ORAL
  Filled 2014-12-12 (×2): qty 1

## 2014-12-12 MED ORDER — DONEPEZIL HCL 10 MG PO TABS
10.0000 mg | ORAL_TABLET | Freq: Every day | ORAL | Status: DC
  Administered 2014-12-12: 10 mg via ORAL
  Filled 2014-12-12 (×2): qty 1

## 2014-12-12 MED ORDER — ENOXAPARIN SODIUM 40 MG/0.4ML ~~LOC~~ SOLN
40.0000 mg | SUBCUTANEOUS | Status: DC
  Administered 2014-12-13: 40 mg via SUBCUTANEOUS
  Filled 2014-12-12: qty 0.4

## 2014-12-12 MED ORDER — ASPIRIN 81 MG PO CHEW
81.0000 mg | CHEWABLE_TABLET | Freq: Every day | ORAL | Status: DC
  Administered 2014-12-12 – 2014-12-13 (×2): 81 mg via ORAL
  Filled 2014-12-12 (×2): qty 1

## 2014-12-12 MED ORDER — SODIUM CHLORIDE 0.9 % IV SOLN
INTRAVENOUS | Status: DC
Start: 2014-12-12 — End: 2014-12-12
  Administered 2014-12-12: 100 mL/h via INTRAVENOUS

## 2014-12-12 MED ORDER — MELATONIN 3 MG PO CAPS: 3.0000 mg | ORAL_CAPSULE | Freq: Every day | ORAL | Status: DC

## 2014-12-12 MED ORDER — ONDANSETRON HCL 4 MG PO TABS: 4.0000 mg | ORAL_TABLET | Freq: Four times a day (QID) | ORAL | Status: DC | PRN

## 2014-12-12 MED ORDER — DIPHENHYDRAMINE HCL 25 MG PO CAPS: 25.0000 mg | ORAL_CAPSULE | Freq: Three times a day (TID) | ORAL | Status: DC | PRN

## 2014-12-12 MED ORDER — ZOLPIDEM TARTRATE 5 MG PO TABS
5.0000 mg | ORAL_TABLET | Freq: Every day | ORAL | Status: DC
  Administered 2014-12-12: 5 mg via ORAL
  Filled 2014-12-12: qty 1

## 2014-12-12 MED ORDER — DIPHENHYDRAMINE HCL (SLEEP) 25 MG PO TABS: 25.0000 mg | ORAL_TABLET | Freq: Three times a day (TID) | ORAL | Status: DC | PRN

## 2014-12-12 MED ORDER — SODIUM CHLORIDE 0.9 % IJ SOLN
3.0000 mL | Freq: Two times a day (BID) | INTRAMUSCULAR | Status: DC
  Administered 2014-12-12: 3 mL via INTRAVENOUS

## 2014-12-12 MED ORDER — DEXTROSE 5 % IV SOLN
1.0000 g | INTRAVENOUS | Status: DC
  Administered 2014-12-12: 1 g via INTRAVENOUS
  Filled 2014-12-12 (×2): qty 10

## 2014-12-12 MED ORDER — ASPIRIN 81 MG PO TABS: 81.0000 mg | ORAL_TABLET | Freq: Every day | ORAL | Status: DC

## 2014-12-12 MED ORDER — ONDANSETRON HCL 4 MG/2ML IJ SOLN: 4.0000 mg | Freq: Four times a day (QID) | INTRAMUSCULAR | Status: DC | PRN

## 2014-12-12 MED ORDER — ENSURE ENLIVE PO LIQD: 237.0000 mL | Freq: Two times a day (BID) | ORAL | Status: DC

## 2014-12-12 NOTE — Evaluation (Signed)
Clinical/Bedside Swallow Evaluation Patient Details  Name: Tracey [name] MRN: [id number] Date of Birth: 1931/01/25  Today's Date: 12/12/2014 Time: SLP Start Time (ACUTE ONLY): 0912 SLP Stop Time (ACUTE ONLY): 0927 SLP Time Calculation (min) (ACUTE ONLY): 15 min  Past Medical History:  Past Medical History  Diagnosis Date  . Memory deficit 10/17/2012  . Abnormality of gait 10/17/2012  . Stroke     Mild left hemiparesis  . Urinary incontinence   . Lumbosacral spondylosis   . Hypertension   . Dyslipidemia   . Spinal stenosis   . Alzheimer disease   . Hemiparesis and alteration of sensations as late effects of stroke 06/24/2014   Past Surgical History:  Past Surgical History  Procedure Laterality Date  . Tubal ligation Bilateral   . Cataract extraction Bilateral   . Ep implantable device N/A 09/08/2014    Procedure: Loop Recorder Insertion;  Surgeon: Marinus Maw, MD;  Location: [facility];  Service: Cardiovascular;  Laterality: N/A;   HPI:  Tracey [name] is a 81 y.o. female with a past medical history of Alzheimer's disease, spinal stenosis, hyperlipidemia, hypertension, status post CVA with mild left-sided hemiparesis who is brought to the emergency department by her daughter due to about 2 weeks of nausea, diarrhea (which has subsided since she was given Imodium at her assisted living facility), decreased appetite, weight loss, urinary frequency and her daughter stated that she has pain mainly bedbound, not wanting to get out of bed and not wanting to drink or eat. She denies fever, chills, but feels fatigued and occasionally lightheaded.   Assessment / Plan / Recommendation Clinical Impression  Bedside swallow evaluation completed.  Patient demonstrates a grossly unremarkable oral motor exam.  She consumed regular textures and thin liquids with effective mastication and cough x1 suspected to be due to large consecutive straw sips; however, following trials did not elicit same response  therefore overall swallow safe with current diet.  Patient demonstrated moderate amounts of belching following PO intake.  As a result, SLP suspects that reflux could have been the cause of cough that was observed and impacting patient's willingness for PO intake.  SLP follow up not warranted at this time given Tracey [name] oral and pharyngeal phase of swallow.  Education complete SLP signing off.     Aspiration Risk  Mild    Diet Recommendation Age appropriate regular solids;Thin   Medication Administration: Whole meds with liquid    Other  Recommendations Oral Care Recommendations: Oral care BID   Follow Up Recommendations  24/7 assist            Pertinent Vitals/Pain Pt report general weakness and pain, RN made aware     Swallow Study Prior Functional Status  Cognitive/Linguistic Baseline: Information not available (suspect baseline deficits) Type of Home: Assisted living    General Other Pertinent Information: Tracey [name] is a 81 y.o. female with a past medical history of Alzheimer's disease, spinal stenosis, hyperlipidemia, hypertension, status post CVA with mild left-sided hemiparesis who is brought to the emergency department by her daughter due to about 2 weeks of nausea, diarrhea (which has subsided since she was given Imodium at her assisted living facility), decreased appetite, weight loss, urinary frequency and her daughter stated that she has pain mainly bedbound, not wanting to get out of bed and not wanting to drink or eat. She denies fever, chills, but feels fatigued and occasionally lightheaded. Type of Study: Bedside swallow evaluation Previous Swallow Assessment: BSE 04/2014 Diet Prior to this  Study: Regular;Thin liquids Temperature Spikes Noted: No Respiratory Status: Room air History of Recent Intubation: No Behavior/Cognition: Alert;Requires cueing Oral Cavity - Dentition: Adequate natural dentition/normal for age Self-Feeding Abilities: Able to feed self Patient  Positioning: Upright in bed Baseline Vocal Quality: Normal Volitional Cough: Strong Volitional Swallow: Able to elicit    Oral/Motor/Sensory Function Overall Oral Motor/Sensory Function: Appears within functional limits for tasks assessed (mild weakness)   Ice Chips Ice chips: Not tested   Thin Liquid Thin Liquid: Within functional limits Presentation: Cup;Straw    Nectar Thick Nectar Thick Liquid: Not tested   Honey Thick Honey Thick Liquid: Not tested   Puree Puree: Not tested   Solid   GO    Solid: Within functional limits Presentation: Self Fed      Tracey [name], M.A., CCC-SLP [phone]  Tracey [name] 12/12/2014,9:44 AM

## 2014-12-12 NOTE — Progress Notes (Signed)
Utilization Review completed. Deissy Guilbert RN BSN CM 

## 2014-12-12 NOTE — Progress Notes (Signed)
Initial Nutrition Assessment  DOCUMENTATION CODES:   Not applicable  INTERVENTION:   Ensure Enlive po BID, each supplement provides 350 kcal and 20 grams of protein  NUTRITION DIAGNOSIS:   Inadequate oral intake related to poor appetite as evidenced by meal completion < 50%.  GOAL:   Patient will meet greater than or equal to 90% of their needs  MONITOR:   PO intake, Supplement acceptance, Diet advancement, Labs, Weight trends, Skin, I & O's  REASON FOR ASSESSMENT:   Malnutrition Screening Tool    ASSESSMENT:   Tracey [name] is a 81 y.o. female with a past medical history of Alzheimer's disease, spinal stenosis, hyperlipidemia, hypertension, status post CVA with mild left-sided hemiparesis who is brought to the emergency department by her daughter due to about 2 weeks of nausea, diarrhea (which has subsided since she was given Imodium at her assisted living facility), decreased appetite, weight loss, urinary frequency and her daughter stated that she has pain mainly bedbound, not wanting to get out of bed and not wanting to drink or eat. She denies fever, chills, but feels fatigued and occasionally lightheaded.  Pt admitted with FTT.   Pt s/p BSE by SLP this AM. He has been advanced to regular textures with thin liquids (Heart Healthy). PO: 50%  Attempted to examine pt x 2, however, pt did not respond to name being called. Pt was covered in blankets with pillows over her head. Nutrition-focused physical exam deferred at this time. Noted depletion on hand. RD suspects some degree of malnutrition, however, does not meet criteria at this time.   Noted hx of wt gain over the past 6 months.   Labs reviewed.   Diet Order:  Diet Heart Room service appropriate?: Yes; Fluid consistency:: Thin  Skin:  Reviewed, no issues  Last BM:  12/11/14  Height:   Ht Readings from Last 1 Encounters:  12/12/14  (1.6 m)    Weight:   Wt Readings from Last 1 Encounters:  12/12/14 114 lb  13.8 oz (52.1 kg)    Ideal Body Weight:  52.3 kg  BMI:  Body mass index is 20.35 kg/(m^2).  Estimated Nutritional Needs:   Kcal:  1400-1600  Protein:  65-75 grams  Fluid:  >1.4 L  EDUCATION NEEDS:   No education needs identified at this time  Eirik Schueler A. Mayford Knife, RD, LDN, CDE Pager: [phone] After hours Pager: [phone]

## 2014-12-12 NOTE — H&P (Signed)
[facility] History and Physical  Nilam Quakenbush ZOX:[id number] DOB: 24-Sep-1930 DOA: 12/11/2014  Referring physician: Ames Dura, MD PCP: Darrow Bussing, MD   Chief Complaint: Nausea and diarrhea for about 2 weeks  HPI: Tracey [name] is a 81 y.o. female with a past medical history of Alzheimer's disease, spinal stenosis, hyperlipidemia, hypertension, status post CVA with mild left-sided hemiparesis who is brought to the emergency department by her daughter due to about 2 weeks of nausea, diarrhea (which has subsided since she was given Imodium at her assisted living facility), decreased appetite, weight loss, urinary frequency and her daughter stated that she has pain mainly bedbound, not wanting to get out of bed and not wanting to drink or eat. She denies fever, chills, but feels fatigued and occasionally lightheaded.  Denies chest pain, dyspnea, palpitations diaphoresis productive cough or any other symptoms. She is currently in no acute distress.   Review of Systems:  Constitutional:  Positive weight loss, fatigue. Denies night sweats, Fevers, chills.  HEENT:  No headaches, Difficulty swallowing,Tooth/dental problems,Sore throat,  No sneezing, itching, ear ache, nasal congestion, post nasal drip,  Cardio-vascular:  No chest pain, Orthopnea, PND, swelling in lower extremities, anasarca, dizziness, palpitations  GI:  No heartburn, indigestion, abdominal pain, nausea, vomiting, diarrhea, change in bowel habits, loss of appetite  Resp:  No shortness of breath with exertion or at rest. No excess mucus, no productive cough, No non-productive cough, No coughing up of blood. No change in color of mucus.No wheezing.No chest wall deformity  Skin:  no rash or lesions.  GU:  Increased urgency or frequency for about 2 weeks. no dysuria, change in color of urine,  No flank pain.  Musculoskeletal:  No joint pain or swelling. No decreased range of motion. No back pain.  Psych:  No change in  mood or affect. No depression or anxiety. No memory loss.  Neuro: History of CVA and dementia.  Past Medical History  Diagnosis Date  . Memory deficit 10/17/2012  . Abnormality of gait 10/17/2012  . Stroke     Mild left hemiparesis  . Urinary incontinence   . Lumbosacral spondylosis   . Hypertension   . Dyslipidemia   . Spinal stenosis   . Alzheimer disease   . Hemiparesis and alteration of sensations as late effects of stroke 06/24/2014   Past Surgical History  Procedure Laterality Date  . Tubal ligation Bilateral   . Cataract extraction Bilateral   . Ep implantable device N/A 09/08/2014    Procedure: Loop Recorder Insertion;  Surgeon: Marinus Maw, MD;  Location: [facility];  Service: Cardiovascular;  Laterality: N/A;   Social History:  reports that she has never smoked. She has never used smokeless tobacco. She reports that she drinks about 0.6 oz of alcohol per week. She reports that she does not use illicit drugs.  Allergies  Allergen Reactions  . Alendronate Sodium     unknown  . Simvastatin     unknown    Family History  Problem Relation Age of Onset  . Parkinsonism Brother   . Dementia Brother   . Heart disease Brother     Prior to Admission medications   Medication Sig Start Date End Date Taking? Authorizing Provider  aspirin 81 MG tablet Take 81 mg by mouth daily.   Yes Historical Provider, MD  Cranberry 450 MG CAPS Take 1 tablet by mouth 2 (two) times daily.    Yes Historical Provider, MD  diphenhydrAMINE (SOMINEX) 25 MG tablet Take  25 mg by mouth every 8 (eight) hours as needed for itching or sleep.    Yes Historical Provider, MD  donepezil (ARICEPT) 10 MG tablet Take 10 mg by mouth daily.    Yes Historical Provider, MD  feeding supplement (BOOST HIGH PROTEIN) LIQD Take 1 Container by mouth 2 (two) times daily between meals.   Yes Historical Provider, MD  HYDROcodone-acetaminophen (NORCO/VICODIN) 5-325 MG per tablet Take 1 tablet by mouth 2 (two) times  daily. Take twice every day for OA per [facility]   Yes Historical Provider, MD  hydrocortisone 2.5 % ointment Apply 2 application topically daily as needed. For hemorrhoids   Yes Historical Provider, MD  ketoconazole (NIZORAL) 2 % shampoo Apply 1 application topically 2 (two) times a week.   Yes Historical Provider, MD  Loperamide HCl (IMODIUM A-D PO) Take 1 tablet by mouth daily as needed. For constipation   Yes Historical Provider, MD  loratadine (CLARITIN) 10 MG tablet Take 10 mg by mouth daily.   Yes Historical Provider, MD  Melatonin 3 MG CAPS Take 3 mg by mouth at bedtime.   Yes Historical Provider, MD  Omega-3 Fatty Acids (FISH OIL) 1000 MG CAPS Take 1,000 mg by mouth daily.   Yes Historical Provider, MD  polyethylene glycol (MIRALAX / GLYCOLAX) packet Take 17 g by mouth daily as needed for mild constipation.   Yes Historical Provider, MD  PRESCRIPTION MEDICATION Pennsaid 2% solution.   Sig: 2 pumps to bilateral knees twice daily as needed for pain.   Yes Historical Provider, MD  promethazine (PHENERGAN) 12.5 MG tablet Take 12.5 mg by mouth every 6 (six) hours as needed for nausea or vomiting.   Yes Historical Provider, MD  white petrolatum (VASELINE) GEL Apply 1 application topically 2 (two) times daily. Peri rectal and peri vaginal area twice daily   Yes Historical Provider, MD   Physical Exam: Filed Vitals:   12/12/14 0045 12/12/14 0130 12/12/14 0200 12/12/14 0237  BP:  98/65 111/44 147/78  Pulse: 64 36 57 64  Temp:    98.1 F (36.7 C)  TempSrc:    Oral  Resp:  Height:     (1.6 m)  Weight:    52.1 kg (114 lb 13.8 oz)  SpO2:  99% 97% 98%    Wt Readings from Last 3 Encounters:  12/12/14 52.1 kg (114 lb 13.8 oz)  09/09/14 52.39 kg (115 lb 8 oz)  08/14/14 53.162 kg (117 lb 3.2 oz)    General:  Appears calm and comfortable Eyes: PERRL, normal lids, irises & conjunctiva ENT: grossly normal hearing, lips & tongue Neck: no LAD, masses or thyromegaly Cardiovascular:  Irregularly irregular, No LE edema. Telemetry: SR, frequent PVCs.  Respiratory: CTA bilaterally, no w/r/r. Normal respiratory effort. Abdomen: soft, ntnd Skin: no rash or induration seen on limited exam Musculoskeletal: grossly normal tone BUE/BLE Psychiatric: grossly normal mood and affect, speech fluent and appropriate Neurologic: 4/5 left-sided hemiparesis.           Labs on Admission:  Basic Metabolic Panel:  Recent Labs Lab 12/11/14 1856  NA 136  K 4.5  CL 101  CO2 26  GLUCOSE 124*  BUN 21*  CREATININE 1.13*  CALCIUM 9.9   Liver Function Tests:  Recent Labs Lab 12/11/14 1856  AST 24  ALT 26  ALKPHOS 60  BILITOT 0.5  PROT 6.8  ALBUMIN 4.0    Recent Labs Lab 12/11/14 1856  LIPASE 59*   No results for input(s):  AMMONIA in the last 168 hours. CBC:  Recent Labs Lab 12/11/14 1856  WBC 7.7  HGB 11.7*  HCT 35.1*  MCV 93.4  PLT 280    Echocardiogram: 04/25/14 ------------------------------------------------------------------- LV EF: 60% -  65%  ------------------------------------------------------------------- Indications:   CVA 436.  ------------------------------------------------------------------- History:  PMH: Cough. Left-sided weakness. Memory deficit. Gait abnormality.  ------------------------------------------------------------------- Study Conclusions  - Left ventricle: The cavity size was normal. There was mild focal basal hypertrophy of the septum. Systolic function was normal. The estimated ejection fraction was in the range of 60% to 65%. Wall motion was normal; there were no regional wall motion abnormalities. Doppler parameters are consistent with abnormal left ventricular relaxation (grade 1 diastolic dysfunction). The E/e&' ratio is between 8-15, suggesting indeterminate LV filling pressure. - Mitral valve: Thickened and partially flail anterior mitral leaflet. There is intermittent LVOT obstruction  during diasole from the leaflet. Mild mitral regurgitation. - Tricuspid valve: There was mild regurgitation. - Pulmonary arteries: PA peak pressure: 32 mm Hg (S). - Inferior vena cava: The vessel was normal in size. The respirophasic diameter changes were in the normal range (>= 50%), consistent with normal central venous pressure.  Impressions:  - LVEF 60-65%, mild basal septal hypertrophy, partially flail and redundant anterior mitral leaflet with mild regurgitation, diastolic dysfunction, indeterminate LV filling pressure.   EKG: Independently reviewed. Vent. rate 63 BPM PR interval 245 ms QRS duration 86 ms QT/QTc 401/410 ms P-R-T axes 100 103 83  Right and left arm electrode reversal, interpretation assumes no reversal Unknown rhythm, irregular rate Prolonged PR interval Right axis deviation Abnormal lateral Q waves Baseline wander in lead(s) V6  Assessment/Plan Principal Problem:   FTT (failure to thrive) in adult Patient has not been eating well for several weeks now. Rehydrate and treat UTI. Consult social services.  Active Problems:   UTI (urinary tract infection) Continue IV Rocephin and follow-up urine culture and sensitivity.    Dementia with Lewy bodies Continue Aricept and supportive care.    HLD (hyperlipidemia) Continue statin and monitor LFTs.    History of CVA (cerebrovascular accident) Supportive care, monitor blood pressure and continue aspirin.     `  Code Status: Full code. DVT Prophylaxis: Lovenox SQ. Family Communication:  Her daughter was present in the emergency department room and provided most of the history. Zina,Vicki Daughter [phone]  [phone]  Disposition Plan: Admit for treatment and consult social services.  Time spent: Over 60 minutes.  Bobette Mo [facility] Pager [phone].

## 2014-12-12 NOTE — Evaluation (Signed)
Physical Therapy Evaluation Patient Details Name: Tracey [name] MRN: [id number] DOB: [birth year] Today's Date: 12/12/2014   History of Present Illness  Pt adm with UTI and FTT. PMH - dementia, rt CVA, HTN, spinal stenosis  Clinical Impression  Pt admitted with above diagnosis and presents to PT with functional limitations due to deficits listed below (See PT problem list). Pt needs skilled PT to maximize independence and safety to allow discharge to back to ALF.      Follow Up Recommendations No PT follow up    Equipment Recommendations  None recommended by PT    Recommendations for Other Services       Precautions / Restrictions Precautions Precautions: Fall      Mobility  Bed Mobility Overal bed mobility: Modified Independent                Transfers Overall transfer level: Needs assistance Equipment used: Rolling walker (2 wheeled) Transfers: Sit to/from Stand Sit to Stand: Min guard         General transfer comment: assist for balance  Ambulation/Gait Ambulation/Gait assistance: Min guard Ambulation Distance (Feet): 150 Feet Assistive device: Rolling walker (2 wheeled) Gait Pattern/deviations: Decreased step length - right;Decreased step length - left;Shuffle Gait velocity: decr Gait velocity interpretation: Below normal speed for age/gender General Gait Details: Pt with difficulty steering rolling walker due to usually uses rollator.   Stairs            Wheelchair Mobility    Modified Rankin (Stroke Patients Only)       Balance Overall balance assessment: Needs assistance Sitting-balance support: No upper extremity supported;Feet supported Sitting balance-Leahy Scale: Good     Standing balance support: Single extremity supported Standing balance-Leahy Scale: Poor Standing balance comment: support of walker                             Pertinent Vitals/Pain Pain Assessment: No/denies pain    Home Living Family/patient  expects to be discharged to:: Assisted living     Type of Home: Assisted living           Additional Comments: [facility]    Prior Function Level of Independence: Needs assistance   Gait / Transfers Assistance Needed: Amb modified independent with rollator per pt           Hand Dominance   Dominant Hand: Right    Extremity/Trunk Assessment   Upper Extremity Assessment: Generalized weakness           Lower Extremity Assessment: Generalized weakness         Communication      Cognition Arousal/Alertness: Awake/alert Behavior During Therapy: WFL for tasks assessed/performed Overall Cognitive Status: History of cognitive impairments - at baseline                      General Comments      Exercises        Assessment/Plan    PT Assessment Patient needs continued PT services  PT Diagnosis Difficulty walking;Generalized weakness   PT Problem List Decreased strength;Decreased activity tolerance;Decreased balance;Decreased mobility;Decreased knowledge of use of DME  PT Treatment Interventions DME instruction;Gait training;Functional mobility training;Therapeutic exercise;Therapeutic activities;Balance training;Patient/family education   PT Goals (Current goals can be found in the Care Plan section) Acute Rehab PT Goals Patient Stated Goal: return to [facility] ALF PT Goal Formulation: With patient Time For Goal Achievement: 12/19/14 Potential to Achieve Goals: Good  Frequency Min 3X/week   Barriers to discharge        Co-evaluation               End of Session Equipment Utilized During Treatment: Gait belt Activity Tolerance: Patient tolerated treatment well Patient left: in chair;with call bell/phone within reach;with bed alarm set Nurse Communication: Mobility status         Time: 9147-8295 PT Time Calculation (min) (ACUTE ONLY): 14 min   Charges:   PT Evaluation $Initial PT Evaluation Tier I: 1 Procedure      PT G Codes:        Tracey [name] Dec 23, 2014, 2:43 PM  [facility] PT [phone]

## 2014-12-12 NOTE — ED Notes (Signed)
Dr Ortiz at bedside 

## 2014-12-12 NOTE — Progress Notes (Signed)
Pt arrived to room 5W16 from ED.  Pt is alert but somewhat confused.  Daughter at bedside.  Safety measures in place.  Will continue to monitor.   Estanislado Emms, RN

## 2014-12-12 NOTE — Care Management Note (Signed)
Case Management Note  Patient Details  Name: Tracey [name] MRN: [id number] Date of Birth: October 30, 1930  Subjective/Objective:                 Patient admitted from [facility]. [phone]. Admitted with FTT AKI, mild UTI.    Action/Plan:  Will continue to follow and offer resources as needed.  Expected Discharge Date:                  Expected Discharge Plan:  Assisted Living / Rest Home ([facility])  In-House Referral:     Discharge planning Services     Post Acute Care Choice:    Choice offered to:     DME Arranged:    DME Agency:     HH Arranged:    HH Agency:     Status of Service:  In process, will continue to follow  Medicare Important Message Given:    Date Medicare IM Given:    Medicare IM give by:    Date Additional Medicare IM Given:    Additional Medicare Important Message give by:     If discussed at Long Length of Stay Meetings, dates discussed:    Additional Comments:  Lawerance Sabal, RN 12/12/2014, 4:11 PM

## 2014-12-12 NOTE — Progress Notes (Signed)
PATIENT DETAILS Name: Tracey [name] Age: 81 y.o. Sex: female Date of Birth: July 12, 1930 Admit Date: 12/11/2014 Admitting Physician Bobette Mo, MD ZOX:[id number],UJWJX, MD  Subjective: Pleasantly confused-but actually follows almost all my commands. Claims she is constipated.  Assessment/Plan: Principal Problem: Urinary tract infection: Afebrile, no leukocytosis. Continue Rocephin, await urine culture. Does not look toxic.  Active Problems: Nausea/vomiting:? Secondary to UTI. Seems to have resolved, belly is soft on exam. Supportive care  Dementia: Continue Aricept  History of CVA: Difficult exam as somewhat confused-but seems to be moving all 4 extremities. Continue aspirin.  FTT (failure to thrive) in adult: PT eval, nutrition eval. Spoke with daughter-DO NOT RESUSCITATE in place  Disposition: Remain inpatient-ALF in next 1-2 days  Antimicrobial agents  See below  Anti-infectives    Start     Dose/Rate Route Frequency Ordered Stop   12/12/14 2200  cefTRIAXone (ROCEPHIN) 1 g in dextrose 5 % 50 mL IVPB     1 g 100 mL/hr over 30 Minutes Intravenous Every 24 hours 12/12/14 0231     12/11/14 2300  cefTRIAXone (ROCEPHIN) 1 g in dextrose 5 % 50 mL IVPB     1 g 100 mL/hr over 30 Minutes Intravenous  Once 12/11/14 2246 12/12/14 0042      DVT Prophylaxis: Prophylactic Lovenox   Code Status: DNR-confirmed with daughter over the phone  Family Communication Daughter-Vicki over the phone  Procedures: None  CONSULTS:  None  Time spent 30 minutes-Greater than 50% of this time was spent in counseling, explanation of diagnosis, planning of further management, and coordination of care.  MEDICATIONS: Scheduled Meds: . aspirin  81 mg Oral Daily  . cefTRIAXone (ROCEPHIN)  IV  1 g Intravenous Q24H  . donepezil  10 mg Oral Daily  . [START ON 12/13/2014] enoxaparin (LOVENOX) injection  40 mg Subcutaneous Q24H  . HYDROcodone-acetaminophen  1 tablet Oral BID   . loratadine  10 mg Oral Daily  . sodium chloride  3 mL Intravenous Q12H  . zolpidem  5 mg Oral QHS   Continuous Infusions:  PRN Meds:.diphenhydrAMINE, ondansetron **OR** ondansetron (ZOFRAN) IV    PHYSICAL EXAM: Vital signs in last 24 hours: Filed Vitals:   12/12/14 0130 12/12/14 0200 12/12/14 0237 12/12/14 0503  BP: 98/65 111/44 147/78 135/56  Pulse: 36 57 64 70  Temp:   98.1 F (36.7 C) 98.3 F (36.8 C)  TempSrc:   Oral Oral  Resp: 16 8 16 13   Height:   5\' 3"  (1.6 m)   Weight:   52.1 kg (114 lb 13.8 oz)   SpO2: 99% 97% 98% 98%    Weight change:  Filed Weights   12/12/14 0237  Weight: 52.1 kg (114 lb 13.8 oz)   Body mass index is 20.35 kg/(m^2).   Gen Exam: Awake -pleasantly confused Neck: Supple, No JVD.   Chest: B/L Clear.   CVS: S1 S2 Regular, no murmurs.  Abdomen: soft, BS +, non tender, non distended.  Extremities: no edema, lower extremities warm to touch. Neurologic: Non Focal-moves all 4 extremities Skin: No Rash.   Wounds: N/A.    Intake/Output from previous day:  Intake/Output Summary (Last 24 hours) at 12/12/14 1318 Last data filed at 12/12/14 1001  Gross per 24 hour  Intake   1360 ml  Output    375 ml  Net    985 ml     LAB RESULTS: CBC  Recent Labs  Lab 12/11/14 1856 12/12/14 0557  WBC 7.7 6.4  HGB 11.7* 10.4*  HCT 35.1* 31.3*  PLT 280 271  MCV 93.4 93.7  MCH 31.1 31.1  MCHC 33.3 33.2  RDW 14.1 14.3    Chemistries   Recent Labs Lab 12/11/14 1856 12/12/14 0557  NA 136 138  K 4.5 4.2  CL 101 105  CO2 26 26  GLUCOSE 124* 124*  BUN 21* 13  CREATININE 1.13* 0.95  CALCIUM 9.9 9.0  MG  --  2.0    CBG: No results for input(s): GLUCAP in the last 168 hours.  GFR Estimated Creatinine Clearance: 37.6 mL/min (by C-G formula based on Cr of 0.95).  Coagulation profile No results for input(s): INR, PROTIME in the last 168 hours.  Cardiac Enzymes No results for input(s): CKMB, TROPONINI, MYOGLOBIN in the last 168  hours.  Invalid input(s): CK  Invalid input(s): POCBNP No results for input(s): DDIMER in the last 72 hours. No results for input(s): HGBA1C in the last 72 hours. No results for input(s): CHOL, HDL, LDLCALC, TRIG, CHOLHDL, LDLDIRECT in the last 72 hours.  Recent Labs  12/12/14 0557  TSH 0.388   No results for input(s): VITAMINB12, FOLATE, FERRITIN, TIBC, IRON, RETICCTPCT in the last 72 hours.  Recent Labs  12/11/14 1856  LIPASE 59*    Urine Studies No results for input(s): UHGB, CRYS in the last 72 hours.  Invalid input(s): UACOL, UAPR, USPG, UPH, UTP, UGL, UKET, UBIL, UNIT, UROB, ULEU, UEPI, UWBC, URBC, UBAC, CAST, UCOM, BILUA  MICROBIOLOGY: Recent Results (from the past 240 hour(s))  Urine culture     Status: None (Preliminary result)   Collection Time: 12/11/14 10:00 PM  Result Value Ref Range Status   Specimen Description URINE, RANDOM  Final   Special Requests NONE  Final   Culture NO GROWTH < 12 HOURS  Final   Report Status PENDING  Incomplete    RADIOLOGY STUDIES/RESULTS: No results found.  Jeoffrey Massed, MD  [facility] Pager:[phone]  If 7PM-7AM, please contact night-coverage [email or web address] Password [email or web address] 12/12/2014, 1:18 PM   LOS: 1 day

## 2014-12-13 DIAGNOSIS — I455 Other specified heart block: Secondary | ICD-10-CM

## 2014-12-13 DIAGNOSIS — N390 Urinary tract infection, site not specified: Principal | ICD-10-CM

## 2014-12-13 DIAGNOSIS — Z8673 Personal history of transient ischemic attack (TIA), and cerebral infarction without residual deficits: Secondary | ICD-10-CM

## 2014-12-13 MED ORDER — CEPHALEXIN 500 MG PO CAPS: 500.0000 mg | ORAL_CAPSULE | Freq: Four times a day (QID) | ORAL | Status: DC

## 2014-12-13 MED ORDER — CEPHALEXIN 500 MG PO CAPS: 500.0000 mg | ORAL_CAPSULE | Freq: Three times a day (TID) | ORAL | Status: DC

## 2014-12-13 MED ORDER — POLYETHYLENE GLYCOL 3350 17 G PO PACK: 17.0000 g | PACK | ORAL | Status: AC

## 2014-12-13 MED ORDER — HYDROCODONE-ACETAMINOPHEN 5-325 MG PO TABS: 1.0000 | ORAL_TABLET | Freq: Two times a day (BID) | ORAL | Status: DC

## 2014-12-13 NOTE — Discharge Summary (Addendum)
PATIENT DETAILS Name: Tracey [name] Age: 81 y.o. Sex: female Date of Birth: 1931/02/11 MRN: [id number]. Admitting Physician: Bobette Mo, MD WUJ:WJXBJYN,WGNFA, MD  Admit Date: 12/11/2014 Discharge date: 12/13/2014  Recommendations for Outpatient Follow-up:  1. Discontinued Aricept-because of bradycardia and sinus pauses.  2. Please ensure follow-up with Dr. Taylor-cardiology.  3. Please avoid beta blockers and calcium channel blockers   PRIMARY DISCHARGE DIAGNOSIS:  Principal Problem:   FTT (failure to thrive) in adult Active Problems:   Dementia with Lewy bodies   HLD (hyperlipidemia)   History of CVA (cerebrovascular accident)   UTI (urinary tract infection)      PAST MEDICAL HISTORY: Past Medical History  Diagnosis Date  . Memory deficit 10/17/2012  . Abnormality of gait 10/17/2012  . Urinary incontinence   . Hypertension   . Dyslipidemia   . Spinal stenosis   . Hemiparesis and alteration of sensations as late effects of stroke 06/24/2014  . Anemia   . Stroke 2000's    mild left hemiparesis  . Lumbosacral spondylosis   . Arthritis     "knees" (12/12/2014)  . Spinal stenosis, lumbar   . Alzheimer disease     DISCHARGE MEDICATIONS: Current Discharge Medication List    START taking these medications   Details  cephALEXin (KEFLEX) 500 MG capsule Take 1 capsule (500 mg total) by mouth 3 (three) times daily. Qty: 9 capsule, Refills: 0      CONTINUE these medications which have CHANGED   Details  HYDROcodone-acetaminophen (NORCO/VICODIN) 5-325 MG per tablet Take 1 tablet by mouth 2 (two) times daily. Take twice every day for OA per [facility] Qty: 15 tablet, Refills: 0    polyethylene glycol (MIRALAX / GLYCOLAX) packet Take 17 g by mouth every other day. Qty: 14 each, Refills: 0      CONTINUE these medications which have NOT CHANGED   Details  aspirin 81 MG tablet Take 81 mg by mouth daily.    Cranberry 450 MG CAPS Take 1 tablet by mouth 2 (two) times daily.       diphenhydrAMINE (SOMINEX) 25 MG tablet Take 25 mg by mouth every 8 (eight) hours as needed for itching or sleep.     feeding supplement (BOOST HIGH PROTEIN) LIQD Take 1 Container by mouth 2 (two) times daily between meals.    ketoconazole (NIZORAL) 2 % shampoo Apply 1 application topically 2 (two) times a week.    loratadine (CLARITIN) 10 MG tablet Take 10 mg by mouth daily.    Melatonin 3 MG CAPS Take 3 mg by mouth at bedtime.    Omega-3 Fatty Acids (FISH OIL) 1000 MG CAPS Take 1,000 mg by mouth daily.      STOP taking these medications     donepezil (ARICEPT) 10 MG tablet      hydrocortisone 2.5 % ointment      Loperamide HCl (IMODIUM A-D PO)      PRESCRIPTION MEDICATION      promethazine (PHENERGAN) 12.5 MG tablet      white petrolatum (VASELINE) GEL         ALLERGIES:   Allergies  Allergen Reactions  . Alendronate Sodium     unknown  . Simvastatin     unknown    BRIEF HPI:  See H&P, Labs, Consult and Test reports for all details in brief, patient was admitted for evaluation of persistent nausea, vomiting. She was found to have a UTI and admitted for further evaluation and treatment  CONSULTATIONS:   None  PERTINENT RADIOLOGIC STUDIES: No results found.   PERTINENT LAB RESULTS: CBC:  Recent Labs  12/11/14 1856 12/12/14 0557  WBC 7.7 6.4  HGB 11.7* 10.4*  HCT 35.1* 31.3*  PLT 280 271   CMET CMP     Component Value Date/Time   NA 138 12/12/2014 0557   K 4.2 12/12/2014 0557   CL 105 12/12/2014 0557   CO2 26 12/12/2014 0557   GLUCOSE 124* 12/12/2014 0557   BUN 13 12/12/2014 0557   CREATININE 0.95 12/12/2014 0557   CALCIUM 9.0 12/12/2014 0557   PROT 5.8* 12/12/2014 0557   ALBUMIN 3.3* 12/12/2014 0557   AST 19 12/12/2014 0557   ALT 21 12/12/2014 0557   ALKPHOS 52 12/12/2014 0557   BILITOT 0.4 12/12/2014 0557   GFRNONAA 54* 12/12/2014 0557   GFRAA >60 12/12/2014 0557    GFR Estimated Creatinine Clearance: 37.6 mL/min (by C-G formula  based on Cr of 0.95).  Recent Labs  12/11/14 1856  LIPASE 59*   No results for input(s): CKTOTAL, CKMB, CKMBINDEX, TROPONINI in the last 72 hours. Invalid input(s): POCBNP No results for input(s): DDIMER in the last 72 hours. No results for input(s): HGBA1C in the last 72 hours. No results for input(s): CHOL, HDL, LDLCALC, TRIG, CHOLHDL, LDLDIRECT in the last 72 hours.  Recent Labs  12/12/14 0557  TSH 0.388   No results for input(s): VITAMINB12, FOLATE, FERRITIN, TIBC, IRON, RETICCTPCT in the last 72 hours. Coags: No results for input(s): INR in the last 72 hours.  Invalid input(s): PT Microbiology: Recent Results (from the past 240 hour(s))  Urine culture     Status: None (Preliminary result)   Collection Time: 12/11/14 10:00 PM  Result Value Ref Range Status   Specimen Description URINE, RANDOM  Final   Special Requests NONE  Final   Culture >=100,000 COLONIES/mL ESCHERICHIA COLI  Final   Report Status PENDING  Incomplete   Organism ID, Bacteria ESCHERICHIA COLI  Final      Susceptibility   Escherichia coli - MIC*    AMPICILLIN 8 SENSITIVE Sensitive     CEFAZOLIN <=4 SENSITIVE Sensitive     CEFTRIAXONE <=1 SENSITIVE Sensitive     CIPROFLOXACIN <=0.25 SENSITIVE Sensitive     GENTAMICIN <=1 SENSITIVE Sensitive     IMIPENEM <=0.25 SENSITIVE Sensitive     NITROFURANTOIN <=16 SENSITIVE Sensitive     TRIMETH/SULFA <=20 SENSITIVE Sensitive     AMPICILLIN/SULBACTAM 4 SENSITIVE Sensitive     PIP/TAZO <=4 SENSITIVE Sensitive     * >=100,000 COLONIES/mL ESCHERICHIA COLI  MRSA PCR Screening     Status: None   Collection Time: 12/12/14  7:57 PM  Result Value Ref Range Status   MRSA by PCR NEGATIVE NEGATIVE Final    Comment:        The GeneXpert MRSA Assay (FDA approved for NASAL specimens only), is one component of a comprehensive MRSA colonization surveillance program. It is not intended to diagnose MRSA infection nor to guide or monitor treatment for MRSA  infections.      BRIEF HOSPITAL COURSE:  Urinary tract infection: Admitted and started on intravenous Rocephin, much improved and now is Afebrile, with no leukocytosis. Urine culture positive for pansensitive Escherichia coli, we will transition to Keflex for a few more days.   Active Problems: Sinus pauses with intermittent bradycardia: Discussed with Dr. Taylor(Cards/EP)-since patient did not have any symptoms (no lightheadedness or syncope over the past few weeks/months) he recommends that we discontinue Aricept and follow. Patient has a  loop recorder in place, Dr. Ladona Ridgel suggests that patient follow with him in the office in the next 2 weeks. Please continue to avoid beta blocker and calcium channel blockers. Plan was discussed with daughter-Vicki over the phone who was agreeable with this.  Nausea/vomiting:? Secondary to UTI. Seems to have resolved, belly is soft on exam. Supportive care  Dementia: Will discontinue Aricept-please see above  History of CVA: Difficult exam as somewhat confused-but seems to be moving all 4 extremities. Continue aspirin.  TODAY-DAY OF DISCHARGE:  Subjective:   Tracey [name] today has no headache,no chest abdominal pain,no new weakness tingling or numbness, feels much better wants to go home today.   Objective:   Blood pressure 144/57, pulse 61, temperature 98.2 F (36.8 C), temperature source Oral, resp. rate 20, height  (1.6 m), weight 52.1 kg (114 lb 13.8 oz), SpO2 97 %.  Intake/Output Summary (Last 24 hours) at 12/13/14 1635 Last data filed at 12/13/14 1430  Gross per 24 hour  Intake    700 ml  Output   1430 ml  Net   -730 ml   Filed Weights   12/12/14 0237  Weight: 52.1 kg (114 lb 13.8 oz)    Exam Awake Alert, Oriented *3, No new F.N deficits, Normal affect [place].AT,PERRAL Supple Neck,No JVD, No cervical lymphadenopathy appriciated.  Symmetrical Chest wall movement, Good air movement bilaterally, CTAB RRR,No Gallops,Rubs or new  Murmurs, No Parasternal Heave +ve B.Sounds, Abd Soft, Non tender, No organomegaly appriciated, No rebound -guarding or rigidity. No Cyanosis, Clubbing or edema, No new Rash or bruise  DISCHARGE CONDITION: Stable  DISPOSITION: ALF  DISCHARGE INSTRUCTIONS:    Activity:  As tolerated   Diet recommendation: Heart Healthy diet   Discharge Instructions    Call MD for:  persistant nausea and vomiting    Complete by:  As directed      Call MD for:  severe uncontrolled pain    Complete by:  As directed      Diet - low sodium heart healthy    Complete by:  As directed      Increase activity slowly    Complete by:  As directed            Follow-up Information    Follow up with Darrow Bussing, MD. Schedule an appointment as soon as possible for a visit in 1 week.   Specialty:  Family Medicine   Contact information:   [address] [place] [address] [phone]       Follow up with Lewayne Bunting, MD. Schedule an appointment as soon as possible for a visit in 2 weeks.   Specialty:  Cardiology   Contact information:   [address] [place] [address] [phone]       Total Time spent on discharge equals 45 minutes.  SignedJeoffrey Massed 12/13/2014 4:35 PM

## 2014-12-13 NOTE — Progress Notes (Addendum)
Touched base with Social Worker Tywan about patient d/c. Per social work the patient's facility policy states the facility needs to come out and physically see the patient before they will accept the patient back and they only do this during weekdays. Social work is currently looking into this and will get back to me when an answer is found.

## 2014-12-13 NOTE — Progress Notes (Signed)
MD Ghimire paged, per central Telemetry and RN Sabita patient had a new onset dysrhythmia of a 3.25 second heart rate pause and HR is consistently in the 50's.

## 2014-12-13 NOTE — Progress Notes (Signed)
NURSING PROGRESS NOTE  Vernita Tague [id number] Discharge Data: 12/13/2014 6:54 PM Attending Provider: No att. providers found WUJ:WJXBJYN,WGNFA, MD   Chapman Moss to be D/C'd SN[facility] per MD order via EMS. Hard copies of all prescriptions and all paperwork given to EMS staff.     All IV's will be discontinued and monitored for bleeding.  All belongings will be returned to patient for patient to take home.  Last Documented Vital Signs:  Blood pressure 144/57, pulse 61, temperature 98.2 F (36.8 C), temperature source Oral, resp. rate 20, height  (1.6 m), weight 52.1 kg (114 lb 13.8 oz), SpO2 97 %.  Leane Platt RN, BS, BSN

## 2014-12-13 NOTE — Social Work (Signed)
Facility currently reviewing documentation for admission.  Beverly Sessions MSW, LCSW [phone]

## 2014-12-16 LAB — URINE CULTURE: Culture: >=100000

## 2014-12-18 ENCOUNTER — Encounter: Payer: Self-pay | Admitting: Internal Medicine

## 2014-12-26 ENCOUNTER — Telehealth: Payer: Self-pay | Admitting: *Deleted

## 2014-12-26 NOTE — Telephone Encounter (Signed)
Contacted patient's daughter regarding frequent pause episodes on LINQ transmissions from 12/01/14-12/25/14.  Patient's daughter reports that the patient was admitted to [facility] on 12/11/14 and that she had frequent bradycardia while in the hospital.  She states that her mother's Aricept was discontinued and that she was told to follow-up with Dr. Ladona Ridgel after discharge, but has not had a chance to make an appointment yet.  Regarding symptoms, patient has expressed to her daughter that she feels very weak.  Patient's daughter made aware to call with worsening symptoms, questions, or concerns.  Explained that we will schedule the patient for a post-hospital follow-up visit with Dr. Ladona Ridgel.  Will route to scheduler to make appointment.  Will review ECGs with Dr. Ladona Ridgel and call patient's daughter with any additional recommendations.

## 2014-12-30 ENCOUNTER — Encounter: Payer: Self-pay | Admitting: Adult Health

## 2014-12-30 ENCOUNTER — Ambulatory Visit (INDEPENDENT_AMBULATORY_CARE_PROVIDER_SITE_OTHER): Payer: Medicare Other | Admitting: Adult Health

## 2014-12-30 VITALS — BP 111/62 | HR 73 | Ht 63.0 in | Wt 116.0 lb

## 2014-12-30 DIAGNOSIS — R269 Unspecified abnormalities of gait and mobility: Secondary | ICD-10-CM | POA: Diagnosis not present

## 2014-12-30 DIAGNOSIS — I6340 Cerebral infarction due to embolism of unspecified cerebral artery: Secondary | ICD-10-CM | POA: Diagnosis not present

## 2014-12-30 DIAGNOSIS — R413 Other amnesia: Secondary | ICD-10-CM | POA: Diagnosis not present

## 2014-12-30 MED ORDER — MEMANTINE HCL 28 X 5 MG & 21 X 10 MG PO TABS: ORAL_TABLET | ORAL | Status: DC

## 2014-12-30 NOTE — Patient Instructions (Signed)
Continue Aspirin Begin Namenda for memory If your symptoms worsen or you develop new symptoms please let us know.

## 2014-12-30 NOTE — Progress Notes (Signed)
I have read the note, and I agree with the clinical assessment and plan.  WILLIS,CHARLES KEITH   

## 2014-12-30 NOTE — Telephone Encounter (Signed)
followup as previously scheduled. GT

## 2014-12-30 NOTE — Progress Notes (Signed)
PATIENT: Tracey [name] DOB: [birth year]  REASON FOR VISIT: follow up- cerebrovascular disease, dementia, gait disorder HISTORY FROM: patient  HISTORY OF PRESENT ILLNESS: Tracey [name] is an 81 year old female with a history of cerebrovascular disease and dementia and a gait disorder. Since the last visit the patient did have an EEG which was unremarkable. Her MRI of the brain showed old infarcts and white matter disease. She had a carotid ultrasound showed 50-69% stenosis. The patient has continued on aspirin. The patient reports that she feels that her memory is slightly worse. She does live at an extended care facility. She states she is able to complete all ADLs independently. When she bathes she has the assistant come in and do her back. The patient had a loop recorder implanted. She states that it did show decrease heart rate at bedtime. For that reason she was taken off of Aricept. The patient's daughter is with her at the visit today. She states that the patient's mood has been an ongoing issue. She was placed on trazodone 50 mg at bedtime. The patient complains of significant back pain. She has an appointment with Tracey [name] this month. The patient uses a walker to ambulate with. Denies any recent falls. Patient denies any new neurological symptoms. She returns today for an evaluation.  HISTORY 06/24/14 (Tracey [name]): Tracey [name] is an 81 year old right-handed white female with a history of cerebrovascular disease and dementia. The patient also has an associated gait disorder, she was last seen in July 2014. The patient has been on Aricept for the dementia. The patient currently is residing in an extended care facility. She was admitted to the hospital in early January 2016 with worsening left-sided weakness. The patient had persistence of symptoms for at least 8-12 hours. The patient resolved her deficits during that hospitalization. Since being in the hospital, her caretaker indicates that she has had episodes  of transient confusion and lack of awareness. She has not had any overt seizure-type events. During the hospital stay, she was evaluated for a possible new stroke. MRI of the brain was done, and appears to show extensive white matter disease bilaterally, but no new infarct is seen. A 2-D echocardiogram was also done and was relatively unremarkable. The patient has been treated with aspirin. She was discharged, and she had recommendations from Tracey [name] for a carotid Doppler study and a prolonged cardiac monitor to be done as an outpatient. She is sent to this office for an evaluation. The patient currently uses a walker for ambulation, she will fall on occasion, but she is not falling frequently.   REVIEW OF SYSTEMS: Out of a complete 14 system review of symptoms, the patient complains only of the following symptoms, and all other reviewed systems are negative.  See history of present illness  ALLERGIES: Allergies  Allergen Reactions  . Alendronate Sodium     unknown  . Simvastatin     unknown    HOME MEDICATIONS: Outpatient Prescriptions Prior to Visit  Medication Sig Dispense Refill  . aspirin 81 MG tablet Take 81 mg by mouth daily.    . cephALEXin (KEFLEX) 500 MG capsule Take 1 capsule (500 mg total) by mouth 3 (three) times daily. 9 capsule 0  . Cranberry 450 MG CAPS Take 1 tablet by mouth 2 (two) times daily.     . diphenhydrAMINE (SOMINEX) 25 MG tablet Take 25 mg by mouth every 8 (eight) hours as needed for itching or sleep.     . feeding supplement (BOOST  HIGH PROTEIN) LIQD Take 1 Container by mouth 2 (two) times daily between meals.    Marland Kitchen HYDROcodone-acetaminophen (NORCO/VICODIN) 5-325 MG per tablet Take 1 tablet by mouth 2 (two) times daily. Take twice every day for OA per MAR 15 tablet 0  . ketoconazole (NIZORAL) 2 % shampoo Apply 1 application topically 2 (two) times a week.    . loratadine (CLARITIN) 10 MG tablet Take 10 mg by mouth daily.    . Melatonin 3 MG CAPS Take 3 mg by  mouth at bedtime.    . Omega-3 Fatty Acids (FISH OIL) 1000 MG CAPS Take 1,000 mg by mouth daily.    . polyethylene glycol (MIRALAX / GLYCOLAX) packet Take 17 g by mouth every other day. 14 each 0   No facility-administered medications prior to visit.    PAST MEDICAL HISTORY: Past Medical History  Diagnosis Date  . Memory deficit 10/17/2012  . Abnormality of gait 10/17/2012  . Urinary incontinence   . Hypertension   . Dyslipidemia   . Spinal stenosis   . Hemiparesis and alteration of sensations as late effects of stroke 06/24/2014  . Anemia   . Stroke 2000's    mild left hemiparesis  . Lumbosacral spondylosis   . Arthritis     "knees" (12/12/2014)  . Spinal stenosis, lumbar   . Alzheimer disease     PAST SURGICAL HISTORY: Past Surgical History  Procedure Laterality Date  . Tubal ligation Bilateral   . Cataract extraction w/ intraocular lens  implant, bilateral Bilateral   . Ep implantable device N/A 09/08/2014    Procedure: Loop Recorder Insertion;  Surgeon: Tracey Maw, MD;  Location: [facility];  Service: Cardiovascular;  Laterality: N/A;    FAMILY HISTORY: Family History  Problem Relation Age of Onset  . Parkinsonism Brother   . Dementia Brother   . Heart disease Brother     SOCIAL HISTORY: Social History   Social History  . Marital Status: Divorced    Spouse Name: N/A  . Number of Children: 2  . Years of Education: 16   Occupational History  . Retired Charity fundraiser    Social History Main Topics  . Smoking status: Never Smoker   . Smokeless tobacco: Never Used  . Alcohol Use: 4.2 oz/week    7 Glasses of wine per week     Comment: 1 glass of wine per day  . Drug Use: No  . Sexual Activity: Not on file   Other Topics Concern  . Not on file   Social History Narrative   Lives alone in one story home.  Has 2 children.  Retired Charity fundraiser.        PHYSICAL EXAM  Filed Vitals:   12/30/14 1349  BP: 111/62  Pulse: 73  Height: 5\' 3"  (1.6 m)  Weight: 116 lb  (52.617 kg)   Body mass index is 20.55 kg/(m^2).   MMSE - Mini Mental State Exam 12/30/2014 06/24/2014  Orientation to time 0 1  Orientation to Place 1 2  Registration 3 3  Attention/ Calculation 5 2  Recall 0 0  Language- name 2 objects 2 2  Language- repeat 1 1  Language- follow 3 step command 2 3  Language- read & follow direction 1 1  Write a sentence 1 1  Copy design 1 1  Total score 17 17   Generalized: Well developed, in no acute distress   Neurological examination  Mentation: Alert oriented to time, place, history taking. Follows all commands  speech and language fluent Cranial nerve II-XII: Pupils were equal round reactive to light. Extraocular movements were full, visual field were full on confrontational test. Facial sensation and strength were normal. Uvula tongue midline. Head turning and shoulder shrug  were normal and symmetric. Motor: The motor testing reveals 5 over 5 strength of all 4 extremities. Good symmetric motor tone is noted throughout.  Sensory: Sensory testing is intact to soft touch on all 4 extremities. No evidence of extinction is noted.  Coordination: Cerebellar testing reveals good finger-nose-finger and heel-to-shin bilaterally.  Gait and station: Patient uses a walker when ambulate in. Tandem gait not attended. Reflexes: Deep tendon reflexes are symmetric but decreased throughout.   DIAGNOSTIC DATA (LABS, IMAGING, TESTING) - I reviewed patient records, labs, notes, testing and imaging myself where available.  Lab Results  Component Value Date   WBC 6.4 12/12/2014   HGB 10.4* 12/12/2014   HCT 31.3* 12/12/2014   MCV 93.7 12/12/2014   PLT 271 12/12/2014      Component Value Date/Time   NA 138 12/12/2014 0557   K 4.2 12/12/2014 0557   CL 105 12/12/2014 0557   CO2 26 12/12/2014 0557   GLUCOSE 124* 12/12/2014 0557   BUN 13 12/12/2014 0557   CREATININE 0.95 12/12/2014 0557   CALCIUM 9.0 12/12/2014 0557   PROT 5.8* 12/12/2014 0557   ALBUMIN  3.3* 12/12/2014 0557   AST 19 12/12/2014 0557   ALT 21 12/12/2014 0557   ALKPHOS 52 12/12/2014 0557   BILITOT 0.4 12/12/2014 0557   GFRNONAA 54* 12/12/2014 0557   GFRAA >60 12/12/2014 0557   Lab Results  Component Value Date   CHOL 199 04/25/2014   HDL 75 04/25/2014   LDLCALC 100* 04/25/2014   TRIG 122 04/25/2014   CHOLHDL 2.7 04/25/2014   Lab Results  Component Value Date   HGBA1C 5.8* 04/25/2014   Lab Results  Component Value Date   VITAMINB12 457 04/24/2014   Lab Results  Component Value Date   TSH 0.388 12/12/2014      ASSESSMENT AND PLAN 81 y.o. year old female  has a past medical history of Memory deficit (10/17/2012); Abnormality of gait (10/17/2012); Urinary incontinence; Hypertension; Dyslipidemia; Spinal stenosis; Hemiparesis and alteration of sensations as late effects of stroke (06/24/2014); Anemia; Stroke (2000's); Lumbosacral spondylosis; Arthritis; Spinal stenosis, lumbar; and Alzheimer disease. here with:  1. Memory disorder-dementia 2. Cerebrovascular disease 3. Gait disorder  The patient did had a loop recorder implanted and it has showed that she has had events at night with decreased heart rate. For that reason she was taken off of Aricept. The patient's MMSE has remained stable. Today her score is 17/30. I will put the patient on Namenda. The patient is encouraged to continue using her walker when ambulating. She has an appointment next week with Tracey [name] regarding her ongoing back pain. Patient advised that if her symptoms worsen or she develops new symptoms she should let us know. She will follow-up in 6 months or sooner if needed.  Butch Penny, MSN, NP-C 12/30/2014, 2:06 PM [facility] [address] [place], [place] [address] [phone]

## 2015-01-02 ENCOUNTER — Other Ambulatory Visit: Payer: Self-pay | Admitting: Physician Assistant

## 2015-01-02 DIAGNOSIS — R197 Diarrhea, unspecified: Secondary | ICD-10-CM

## 2015-01-02 DIAGNOSIS — R634 Abnormal weight loss: Secondary | ICD-10-CM

## 2015-01-02 DIAGNOSIS — R110 Nausea: Secondary | ICD-10-CM

## 2015-01-03 ENCOUNTER — Observation Stay (HOSPITAL_COMMUNITY): Payer: Medicare Other

## 2015-01-03 ENCOUNTER — Emergency Department (HOSPITAL_COMMUNITY): Payer: Medicare Other

## 2015-01-03 ENCOUNTER — Encounter (HOSPITAL_COMMUNITY): Payer: Self-pay | Admitting: Emergency Medicine

## 2015-01-03 ENCOUNTER — Inpatient Hospital Stay (HOSPITAL_COMMUNITY)
Admission: EM | Admit: 2015-01-03 | Discharge: 2015-01-06 | DRG: 092 | Disposition: A | Discharge status: Skilled Nursing Facility | Payer: Medicare Other | Attending: Internal Medicine | Admitting: Internal Medicine

## 2015-01-03 DIAGNOSIS — N179 Acute kidney failure, unspecified: Secondary | ICD-10-CM | POA: Diagnosis present

## 2015-01-03 DIAGNOSIS — M545 Low back pain: Secondary | ICD-10-CM | POA: Diagnosis present

## 2015-01-03 DIAGNOSIS — E860 Dehydration: Secondary | ICD-10-CM | POA: Diagnosis not present

## 2015-01-03 DIAGNOSIS — R4182 Altered mental status, unspecified: Secondary | ICD-10-CM | POA: Diagnosis not present

## 2015-01-03 DIAGNOSIS — I495 Sick sinus syndrome: Secondary | ICD-10-CM | POA: Diagnosis present

## 2015-01-03 DIAGNOSIS — T438X5A Adverse effect of other psychotropic drugs, initial encounter: Secondary | ICD-10-CM | POA: Diagnosis present

## 2015-01-03 DIAGNOSIS — R7989 Other specified abnormal findings of blood chemistry: Secondary | ICD-10-CM | POA: Diagnosis present

## 2015-01-03 DIAGNOSIS — I69359 Hemiplegia and hemiparesis following cerebral infarction affecting unspecified side: Secondary | ICD-10-CM

## 2015-01-03 DIAGNOSIS — M6282 Rhabdomyolysis: Secondary | ICD-10-CM | POA: Diagnosis present

## 2015-01-03 DIAGNOSIS — G92 Toxic encephalopathy: Principal | ICD-10-CM | POA: Diagnosis present

## 2015-01-03 DIAGNOSIS — G3183 Dementia with Lewy bodies: Secondary | ICD-10-CM | POA: Diagnosis present

## 2015-01-03 DIAGNOSIS — I4891 Unspecified atrial fibrillation: Secondary | ICD-10-CM | POA: Diagnosis present

## 2015-01-03 DIAGNOSIS — I4581 Long QT syndrome: Secondary | ICD-10-CM | POA: Diagnosis present

## 2015-01-03 DIAGNOSIS — R441 Visual hallucinations: Secondary | ICD-10-CM | POA: Diagnosis present

## 2015-01-03 DIAGNOSIS — D72829 Elevated white blood cell count, unspecified: Secondary | ICD-10-CM | POA: Diagnosis present

## 2015-01-03 DIAGNOSIS — N183 Chronic kidney disease, stage 3 unspecified: Secondary | ICD-10-CM | POA: Diagnosis present

## 2015-01-03 DIAGNOSIS — M4806 Spinal stenosis, lumbar region: Secondary | ICD-10-CM | POA: Diagnosis present

## 2015-01-03 DIAGNOSIS — R9431 Abnormal electrocardiogram [ECG] [EKG]: Secondary | ICD-10-CM | POA: Diagnosis present

## 2015-01-03 DIAGNOSIS — W19XXXA Unspecified fall, initial encounter: Secondary | ICD-10-CM | POA: Diagnosis present

## 2015-01-03 DIAGNOSIS — G309 Alzheimer's disease, unspecified: Secondary | ICD-10-CM | POA: Diagnosis present

## 2015-01-03 DIAGNOSIS — R627 Adult failure to thrive: Secondary | ICD-10-CM | POA: Diagnosis present

## 2015-01-03 DIAGNOSIS — R740 Nonspecific elevation of levels of transaminase and lactic acid dehydrogenase [LDH]: Secondary | ICD-10-CM | POA: Diagnosis present

## 2015-01-03 DIAGNOSIS — I129 Hypertensive chronic kidney disease with stage 1 through stage 4 chronic kidney disease, or unspecified chronic kidney disease: Secondary | ICD-10-CM | POA: Diagnosis present

## 2015-01-03 DIAGNOSIS — I69354 Hemiplegia and hemiparesis following cerebral infarction affecting left non-dominant side: Secondary | ICD-10-CM

## 2015-01-03 DIAGNOSIS — K219 Gastro-esophageal reflux disease without esophagitis: Secondary | ICD-10-CM | POA: Diagnosis present

## 2015-01-03 DIAGNOSIS — Z66 Do not resuscitate: Secondary | ICD-10-CM | POA: Diagnosis present

## 2015-01-03 DIAGNOSIS — R52 Pain, unspecified: Secondary | ICD-10-CM

## 2015-01-03 DIAGNOSIS — R001 Bradycardia, unspecified: Secondary | ICD-10-CM | POA: Diagnosis present

## 2015-01-03 DIAGNOSIS — I69398 Other sequelae of cerebral infarction: Secondary | ICD-10-CM

## 2015-01-03 DIAGNOSIS — M549 Dorsalgia, unspecified: Secondary | ICD-10-CM

## 2015-01-03 DIAGNOSIS — R7401 Elevation of levels of liver transaminase levels: Secondary | ICD-10-CM | POA: Diagnosis present

## 2015-01-03 DIAGNOSIS — R32 Unspecified urinary incontinence: Secondary | ICD-10-CM | POA: Diagnosis present

## 2015-01-03 DIAGNOSIS — R748 Abnormal levels of other serum enzymes: Secondary | ICD-10-CM | POA: Diagnosis present

## 2015-01-03 DIAGNOSIS — Z8673 Personal history of transient ischemic attack (TIA), and cerebral infarction without residual deficits: Secondary | ICD-10-CM

## 2015-01-03 DIAGNOSIS — Z7982 Long term (current) use of aspirin: Secondary | ICD-10-CM

## 2015-01-03 DIAGNOSIS — R778 Other specified abnormalities of plasma proteins: Secondary | ICD-10-CM | POA: Diagnosis present

## 2015-01-03 DIAGNOSIS — E785 Hyperlipidemia, unspecified: Secondary | ICD-10-CM | POA: Diagnosis present

## 2015-01-03 DIAGNOSIS — R4781 Slurred speech: Secondary | ICD-10-CM | POA: Diagnosis present

## 2015-01-03 DIAGNOSIS — G8929 Other chronic pain: Secondary | ICD-10-CM | POA: Diagnosis present

## 2015-01-03 DIAGNOSIS — F0280 Dementia in other diseases classified elsewhere without behavioral disturbance: Secondary | ICD-10-CM | POA: Diagnosis present

## 2015-01-03 DIAGNOSIS — Z993 Dependence on wheelchair: Secondary | ICD-10-CM

## 2015-01-03 LAB — I-STAT TROPONIN, ED: Troponin i, poc: 0.11 ng/mL (ref 0.00–0.08)

## 2015-01-03 LAB — URINALYSIS, ROUTINE W REFLEX MICROSCOPIC
Bilirubin Urine: NEGATIVE
Glucose, UA: NEGATIVE mg/dL
Ketones, ur: NEGATIVE mg/dL
Nitrite: NEGATIVE
Protein, ur: NEGATIVE mg/dL
Specific Gravity, Urine: 1.008 (ref 1.005–1.030)
Urobilinogen, UA: 0.2 mg/dL (ref 0.0–1.0)
pH: 6.5 (ref 5.0–8.0)

## 2015-01-03 LAB — COMPREHENSIVE METABOLIC PANEL
ALT: 33 U/L (ref 14–54)
AST: 58 U/L — ABNORMAL HIGH (ref 15–41)
Albumin: 3.9 g/dL (ref 3.5–5.0)
Alkaline Phosphatase: 62 U/L (ref 38–126)
Anion gap: 10 (ref 5–15)
BUN: 23 mg/dL — ABNORMAL HIGH (ref 6–20)
CO2: 22 mmol/L (ref 22–32)
Calcium: 9.8 mg/dL (ref 8.9–10.3)
Chloride: 107 mmol/L (ref 101–111)
Creatinine, Ser: 1.22 mg/dL — ABNORMAL HIGH (ref 0.44–1.00)
GFR calc Af Amer: 46 mL/min — ABNORMAL LOW (ref >60)
GFR calc non Af Amer: 40 mL/min — ABNORMAL LOW (ref >60)
Glucose, Bld: 112 mg/dL — ABNORMAL HIGH (ref 65–99)
Potassium: 4.1 mmol/L (ref 3.5–5.1)
Sodium: 139 mmol/L (ref 135–145)
Total Bilirubin: 1 mg/dL (ref 0.3–1.2)
Total Protein: 6.5 g/dL (ref 6.5–8.1)

## 2015-01-03 LAB — APTT: aPTT: 29 seconds (ref 24–37)

## 2015-01-03 LAB — I-STAT CHEM 8, ED
BUN: 24 mg/dL — ABNORMAL HIGH (ref 6–20)
Calcium, Ion: 1.22 mmol/L (ref 1.13–1.30)
Chloride: 107 mmol/L (ref 101–111)
Creatinine, Ser: 1.2 mg/dL — ABNORMAL HIGH (ref 0.44–1.00)
Glucose, Bld: 111 mg/dL — ABNORMAL HIGH (ref 65–99)
HCT: 36 % (ref 36.0–46.0)
Hemoglobin: 12.2 g/dL (ref 12.0–15.0)
Potassium: 4.1 mmol/L (ref 3.5–5.1)
Sodium: 140 mmol/L (ref 135–145)
TCO2: 23 mmol/L (ref 0–100)

## 2015-01-03 LAB — CBC
HCT: 34.2 % — ABNORMAL LOW (ref 36.0–46.0)
Hemoglobin: 11.5 g/dL — ABNORMAL LOW (ref 12.0–15.0)
MCH: 31.5 pg (ref 26.0–34.0)
MCHC: 33.6 g/dL (ref 30.0–36.0)
MCV: 93.7 fL (ref 78.0–100.0)
Platelets: 243 10*3/uL (ref 150–400)
RBC: 3.65 MIL/uL — ABNORMAL LOW (ref 3.87–5.11)
RDW: 13.3 % (ref 11.5–15.5)
WBC: 10.6 10*3/uL — ABNORMAL HIGH (ref 4.0–10.5)

## 2015-01-03 LAB — RAPID URINE DRUG SCREEN, HOSP PERFORMED
Amphetamines: NOT DETECTED
Barbiturates: NOT DETECTED
Benzodiazepines: NOT DETECTED
Cocaine: NOT DETECTED
Opiates: NOT DETECTED
Tetrahydrocannabinol: NOT DETECTED

## 2015-01-03 LAB — DIFFERENTIAL
Basophils Absolute: 0 10*3/uL (ref 0.0–0.1)
Basophils Relative: 0 %
Eosinophils Absolute: 0.1 10*3/uL (ref 0.0–0.7)
Eosinophils Relative: 1 %
Lymphocytes Relative: 14 %
Lymphs Abs: 1.5 10*3/uL (ref 0.7–4.0)
Monocytes Absolute: 0.9 10*3/uL (ref 0.1–1.0)
Monocytes Relative: 8 %
Neutro Abs: 8.2 10*3/uL — ABNORMAL HIGH (ref 1.7–7.7)
Neutrophils Relative %: 77 %

## 2015-01-03 LAB — URINE MICROSCOPIC-ADD ON

## 2015-01-03 LAB — TROPONIN I
TROPONIN I: 0.18 ng/mL — AB (ref <0.031)
Troponin I: 0.16 ng/mL — ABNORMAL HIGH (ref <0.031)

## 2015-01-03 LAB — PROTIME-INR
INR: 1.14 (ref 0.00–1.49)
Prothrombin Time: 14.8 seconds (ref 11.6–15.2)

## 2015-01-03 LAB — CK: Total CK: 1050 U/L — ABNORMAL HIGH (ref 38–234)

## 2015-01-03 LAB — ETHANOL: Alcohol, Ethyl (B): <5 mg/dL (ref <5)

## 2015-01-03 MED ORDER — ACETAMINOPHEN 325 MG PO TABS
650.0000 mg | ORAL_TABLET | Freq: Four times a day (QID) | ORAL | Status: DC | PRN
  Administered 2015-01-04 – 2015-01-05 (×5): 650 mg via ORAL
  Filled 2015-01-03 (×5): qty 2

## 2015-01-03 MED ORDER — ALUM & MAG HYDROXIDE-SIMETH 200-200-20 MG/5ML PO SUSP
30.0000 mL | Freq: Four times a day (QID) | ORAL | Status: DC | PRN
Start: 2015-01-03 — End: 2015-01-06

## 2015-01-03 MED ORDER — TRAZODONE HCL 50 MG PO TABS
25.0000 mg | ORAL_TABLET | Freq: Every day | ORAL | Status: DC
  Administered 2015-01-03 – 2015-01-05 (×3): 25 mg via ORAL
  Filled 2015-01-03 (×3): qty 1

## 2015-01-03 MED ORDER — ENSURE ENLIVE PO LIQD
237.0000 mL | Freq: Two times a day (BID) | ORAL | Status: DC
  Administered 2015-01-04 – 2015-01-05 (×4): 237 mL via ORAL

## 2015-01-03 MED ORDER — SODIUM CHLORIDE 0.9 % IV SOLN
Freq: Once | INTRAVENOUS | Status: AC
  Administered 2015-01-03: 16:00:00 via INTRAVENOUS

## 2015-01-03 MED ORDER — PANTOPRAZOLE SODIUM 40 MG PO TBEC
40.0000 mg | DELAYED_RELEASE_TABLET | Freq: Every day | ORAL | Status: DC
  Administered 2015-01-04 – 2015-01-06 (×3): 40 mg via ORAL
  Filled 2015-01-03 (×3): qty 1

## 2015-01-03 MED ORDER — LORATADINE 10 MG PO TABS
10.0000 mg | ORAL_TABLET | Freq: Every day | ORAL | Status: DC
  Administered 2015-01-04 – 2015-01-06 (×3): 10 mg via ORAL
  Filled 2015-01-03 (×3): qty 1

## 2015-01-03 MED ORDER — SODIUM CHLORIDE 0.9 % IV SOLN
INTRAVENOUS | Status: DC
  Administered 2015-01-04 – 2015-01-05 (×3): via INTRAVENOUS

## 2015-01-03 MED ORDER — ASPIRIN EC 81 MG PO TBEC
81.0000 mg | DELAYED_RELEASE_TABLET | Freq: Every day | ORAL | Status: DC
  Administered 2015-01-04 – 2015-01-06 (×3): 81 mg via ORAL
  Filled 2015-01-03 (×3): qty 1

## 2015-01-03 MED ORDER — OMEGA-3-ACID ETHYL ESTERS 1 G PO CAPS
1.0000 g | ORAL_CAPSULE | Freq: Two times a day (BID) | ORAL | Status: DC
  Administered 2015-01-04 – 2015-01-06 (×5): 1 g via ORAL
  Filled 2015-01-03 (×5): qty 1

## 2015-01-03 MED ORDER — POLYETHYLENE GLYCOL 3350 17 G PO PACK
17.0000 g | PACK | ORAL | Status: DC
  Administered 2015-01-04: 17 g via ORAL
  Filled 2015-01-03 (×2): qty 1

## 2015-01-03 MED ORDER — SODIUM CHLORIDE 0.9 % IJ SOLN
3.0000 mL | Freq: Two times a day (BID) | INTRAMUSCULAR | Status: DC
Start: 2015-01-03 — End: 2015-01-06
  Administered 2015-01-03 – 2015-01-06 (×4): 3 mL via INTRAVENOUS

## 2015-01-03 MED ORDER — ENOXAPARIN SODIUM 40 MG/0.4ML ~~LOC~~ SOLN
40.0000 mg | SUBCUTANEOUS | Status: DC
  Administered 2015-01-03 – 2015-01-05 (×3): 40 mg via SUBCUTANEOUS
  Filled 2015-01-03 (×3): qty 0.4

## 2015-01-03 MED ORDER — BOOST / RESOURCE BREEZE PO LIQD
1.0000 | Freq: Three times a day (TID) | ORAL | Status: DC
  Administered 2015-01-03 – 2015-01-05 (×5): 1 via ORAL

## 2015-01-03 NOTE — Progress Notes (Signed)
Attempted to obtain report. Nurse unavailable

## 2015-01-03 NOTE — H&P (Signed)
[facility] History and Physical                                                                                    Kanisha Duba, is a 81 y.o. female  MRN: [id number]   DOB - 1930/11/24  Admit Date - 01/03/2015  Outpatient Primary MD for the patient is Darrow Bussing, MD  Referring MD: [facility] / ER  With History of -  Past Medical History  Diagnosis Date  . Memory deficit 10/17/2012  . Abnormality of gait 10/17/2012  . Urinary incontinence   . Hypertension   . Dyslipidemia   . Spinal stenosis   . Hemiparesis and alteration of sensations as late effects of stroke 06/24/2014  . Anemia   . Stroke 2000's    mild left hemiparesis  . Lumbosacral spondylosis   . Arthritis     "knees" (12/12/2014)  . Spinal stenosis, lumbar   . Alzheimer disease       Past Surgical History  Procedure Laterality Date  . Tubal ligation Bilateral   . Cataract extraction w/ intraocular lens  implant, bilateral Bilateral   . Ep implantable device N/A 09/08/2014    Procedure: Loop Recorder Insertion;  Surgeon: Marinus Maw, MD;  Location: [facility];  Service: Cardiovascular;  Laterality: N/A;    in for   Chief Complaint  Patient presents with  . Fall     HPI This is an 63-year-old female patient with history of dementia with bili bodies, prior stroke with residual left hemiparesis and gait imbalance, anemia of chronic kidney disease, chronic kidney disease stage III, known failure to thrive, dyslipidemia. She was recently discharged on 8/27 after being treated for urinary tract infection. Also had episodic bradycardia with sinus pulses attributed to her Aricept so this was discontinued. A loop recorder was placed by Dr. Ladona Ridgel. She had recently followed up on 9/13 with her neurologist and Namenda was initiated for her underlying dementia symptoms. She also has a history of low back pain and has recently followed up with her orthopedic physician. At that visit she was started on unknown pain  medication was to be dosed initially at hour of sleep and if she tolerated daytime dosing was to be added in. Patient was sent to this hospital today because of altered mentation involving slurred/garbled speech without other focal neurological deficits and hallucinatory behaviors. In discussing with the daughter patient has not yet returned to baseline. She reported regarding the new pain medication added by the orthopedic physician. Patient primarily complains of weakness and daughter confirms she does not drink enough oral fluids. Patient was also recently started on a PPI for chest burning and reflux symptoms. In regards to the patient's hallucinatory behaviors the nursing facility she apparently had been seeing and son the window seal while pointing to the wall of the exam room.  ER patient was afebrile, blood pressure was 173/37, she was in atrial fibrillation, pulse was 66 and irregular, respirations 22 and room air saturations were percent. Laboratory data unremarkable except for mild azotemia with a BUN of 23 baseline 13 and creatinine 1.22 with baseline 0.95. Co-slightly elevated, AST elevated at 58 otherwise  transaminases normal. Mild elevation and point-of-care troponin 0.11 with serum troponin for confirmation pending. EKG was unremarkable. Patient had mild leukocytosis white count 10,600, hemoglobin stable and 0.5, platelets normal 243,000. Urinalysis was unremarkable except for trace leukocytes. I'll call levels less than 5, urine drug screen was negative. CT of the head showed no acute changes.    Review of Systems   In addition to the HPI above,  No Fever-chills, myalgias or other constitutional symptoms No Headache, changes with Vision or hearing, new weakness, tingling, numbness in any extremity, No problems swallowing food or Liquids, indigestion/reflux No Chest pain, Cough or Shortness of Breath, palpitations, orthopnea or DOE No Abdominal pain, N/V; no melena or hematochezia, no  dark tarry stools, Bowel movements are regular, No dysuria, hematuria or flank pain No new skin rashes, lesions, masses or bruises, No new joints pains-aches No recent weight gain or loss No polyuria, polydypsia or polyphagia,  *A full 10 point Review of Systems was done, except as stated above, all other Review of Systems were negative.  Social History Social History  Substance Use Topics  . Smoking status: Never Smoker   . Smokeless tobacco: Never Used  . Alcohol Use: 4.2 oz/week    7 Glasses of wine per week     Comment: 1 glass of wine per day    Resides at: Skilled nursing facility  Lives with: N/A  Ambulatory status: With Rollator   Family History Family History  Problem Relation Age of Onset  . Parkinsonism Brother   . Dementia Brother   . Heart disease Brother      Prior to Admission medications   Medication Sig Start Date End Date Taking? Authorizing Provider  acetaminophen (TYLENOL) 500 MG tablet Take 500 mg by mouth every 6 (six) hours as needed.   Yes Historical Provider, MD  aspirin 81 MG tablet Take 81 mg by mouth daily.   Yes Historical Provider, MD  Cranberry 450 MG CAPS Take 1 tablet by mouth 2 (two) times daily.    Yes Historical Provider, MD  diphenhydrAMINE (SOMINEX) 25 MG tablet Take 25 mg by mouth every 8 (eight) hours as needed for itching or sleep.    Yes Historical Provider, MD  feeding supplement (BOOST HIGH PROTEIN) LIQD Take 1 Container by mouth 2 (two) times daily between meals.   Yes Historical Provider, MD  HYDROcodone-acetaminophen (NORCO/VICODIN) 5-325 MG per tablet Take 1 tablet by mouth 2 (two) times daily. Take twice every day for OA per [facility] 12/13/14  Yes Shanker Levora Dredge, MD  loratadine (CLARITIN) 10 MG tablet Take 10 mg by mouth daily.   Yes Historical Provider, MD  Melatonin 3 MG CAPS Take 3 mg by mouth at bedtime.   Yes Historical Provider, MD  Omega-3 Fatty Acids (FISH OIL) 1000 MG CAPS Take 1,000 mg by mouth daily.   Yes  Historical Provider, MD  omeprazole (PRILOSEC) 20 MG capsule Take 20 mg by mouth daily.   Yes Historical Provider, MD  polyethylene glycol (MIRALAX / GLYCOLAX) packet Take 17 g by mouth every other day. 12/13/14  Yes Shanker Levora Dredge, MD  traZODone (DESYREL) 50 MG tablet Take 50 mg by mouth at bedtime.   Yes Historical Provider, MD  zinc oxide 20 % ointment Apply 1 application topically as needed for irritation.   Yes Historical Provider, MD  cephALEXin (KEFLEX) 500 MG capsule Take 1 capsule (500 mg total) by mouth 3 (three) times daily. Patient not taking: Reported on 01/03/2015 12/13/14   Shanker  Levora Dredge, MD  ketoconazole (NIZORAL) 2 % shampoo Apply 1 application topically 2 (two) times a week.    Historical Provider, MD  memantine [facility] TITRATION PAK) tablet pack 5 mg/day for =1 week; 5 mg twice daily for =1 week; 15 mg/day given in 5 mg and 10 mg separated doses for =1 week; then 10 mg twice daily 12/30/14   Butch Penny, NP    Allergies  Allergen Reactions  . Alendronate Sodium     unknown  . Simvastatin     unknown    Physical Exam  Vitals  Blood pressure 126/48, pulse 78, temperature 98.7 F (37.1 C), temperature source Oral, resp. rate 22, SpO2 98 %.   General:  In no acute distress, appears chronically ill and somewhat frail  Psych: Flat affect, Denies hallucinations and then when asked if still seeing the "dance" she reported "I'm just not can talk about that anymore" Awake Alert, Oriented X   Neuro:   No focal neurological deficits, CN II through XII intact, Strength 5/5 all 4 extremities, Sensation intact all 4 extremities.  ENT:  Ears and Eyes appear Normal, Conjunctivae clear, PER. Moist oral mucosa without erythema or exudates.  Neck:  Supple, No lymphadenopathy appreciated  Respiratory:  Symmetrical chest wall movement, Good air movement bilaterally, CTAB. Room Air  Cardiac:  RRR, the stylet murmur grade 3/5 left sternal border second intercostal space, no  LE edema noted, no JVD, No carotid bruits, peripheral pulses palpable at 2+  Abdomen:  Positive bowel sounds, Soft, Non tender, Non distended,  No masses appreciated, no obvious hepatosplenomegaly  Skin:  No Cyanosis, Normal Skin Turgor, No Skin Rash or Bruise.  Extremities: Symmetrical without obvious trauma or injury,  no effusions.  Data Review  CBC  Recent Labs Lab 01/03/15 1309 01/03/15 1315  WBC  --  10.6*  HGB 12.2 11.5*  HCT 36.0 34.2*  PLT  --  243  MCV  --  93.7  MCH  --  31.5  MCHC  --  33.6  RDW  --  13.3  LYMPHSABS  --  1.5  MONOABS  --  0.9  EOSABS  --  0.1  BASOSABS  --  0.0    Chemistries   Recent Labs Lab 01/03/15 1309 01/03/15 1315  NA 140 139  K 4.1 4.1  CL 107 107  CO2  --  22  GLUCOSE 111* 112*  BUN 24* 23*  CREATININE 1.20* 1.22*  CALCIUM  --  9.8  AST  --  58*  ALT  --  33  ALKPHOS  --  62  BILITOT  --  1.0    estimated creatinine clearance is 29.4 mL/min (by C-G formula based on Cr of 1.22).  No results for input(s): TSH, T4TOTAL, T3FREE, THYROIDAB in the last 72 hours.  Invalid input(s): FREET3  Coagulation profile  Recent Labs Lab 01/03/15 1315  INR 1.14    No results for input(s): DDIMER in the last 72 hours.  Cardiac Enzymes No results for input(s): CKMB, TROPONINI, MYOGLOBIN in the last 168 hours.  Invalid input(s): CK  Invalid input(s): POCBNP  Urinalysis    Component Value Date/Time   COLORURINE YELLOW 01/03/2015 1316   APPEARANCEUR CLEAR 01/03/2015 1316   LABSPEC 1.008 01/03/2015 1316   PHURINE 6.5 01/03/2015 1316   GLUCOSEU NEGATIVE 01/03/2015 1316   HGBUR MODERATE* 01/03/2015 1316   BILIRUBINUR NEGATIVE 01/03/2015 1316   KETONESUR NEGATIVE 01/03/2015 1316   PROTEINUR NEGATIVE 01/03/2015 1316   UROBILINOGEN 0.2  01/03/2015 1316   NITRITE NEGATIVE 01/03/2015 1316   LEUKOCYTESUR TRACE* 01/03/2015 1316    Imaging results:   Dg Thoracic Spine W/swimmers  01/03/2015   CLINICAL DATA:  Back pain  status post fall.  EXAM: THORACIC SPINE - 3 VIEWS  COMPARISON:  None.  FINDINGS: There is no evidence of thoracic spine fracture. There is thoracic spine kyphosis, likely secondary to osteoarthritic changes. Mild height loss of T11, T12, and L1 is noted, likely related to degenerative changes. No other significant bone abnormalities are identified. Electronic apparatus overlies the lower left thorax.  IMPRESSION: No evidence of displaced fracture of the thoracic spine.  Osteoarthritic changes, with likely degenerative height loss of T11, T12 and L1 vertebral bodies. Please correlate to patient's clinical exam.   Electronically Signed   By: Ted Mcalpine M.D.   On: 01/03/2015 14:41   Ct Head Wo Contrast  01/03/2015   CLINICAL DATA:  Patient fell. Found at the foot upper been. Confused with neck pain.  EXAM: CT HEAD WITHOUT CONTRAST  CT CERVICAL SPINE WITHOUT CONTRAST  TECHNIQUE: Multidetector CT imaging of the head and cervical spine was performed following the standard protocol without intravenous contrast. Multiplanar CT image reconstructions of the cervical spine were also generated.  COMPARISON:  04/24/2014  FINDINGS: CT HEAD FINDINGS  Ventricles are enlarged, to a greater degree than the sulci, but are stable from the prior CT. Findings are consistent with advanced atrophy.  There are no parenchymal masses or mass effect.  There are multiple old infarcts involving both frontal lobes and the right external capsule on the left thalamus. White matter hypoattenuation is seen more diffusely consistent with moderate to advanced chronic microvascular ischemic change.  There is no evidence of a recent cortical infarct.  There are no extra-axial masses or abnormal fluid collections.  There is no intracranial hemorrhage.  No skull fracture. Moderate mucosal thickening lines the ethmoid air cells. There is mild maxillary sinus mucosal thickening. Mucous retention cyst lies in the right sphenoid sinus. Clear  mastoid air cells.  CT CERVICAL SPINE FINDINGS  No fracture. No spondylolisthesis. There is marked loss of disc height at C5-C6. Moderate loss disc height is noted at C6-C7. There is mild to moderate loss of disc height at C2-C3 and C3-C4. Facet degenerative changes noted bilaterally. There is acquired fusion of the right facets at C2-C3 and C3-C4. Bones are diffusely demineralized. There multiple levels of neural foraminal narrowing. Soft tissues are unremarkable. Scarring is noted at the lung apices.  IMPRESSION: HEAD CT: No acute intracranial abnormalities. Advanced atrophy, chronic microvascular ischemic change and multiple old infarcts.  CERVICAL CT: No fracture or acute finding. Advanced degenerative changes.   Electronically Signed   By: Amie Portland M.D.   On: 01/03/2015 15:05   Ct Cervical Spine Wo Contrast  01/03/2015   CLINICAL DATA:  Patient fell. Found at the foot upper been. Confused with neck pain.  EXAM: CT HEAD WITHOUT CONTRAST  CT CERVICAL SPINE WITHOUT CONTRAST  TECHNIQUE: Multidetector CT imaging of the head and cervical spine was performed following the standard protocol without intravenous contrast. Multiplanar CT image reconstructions of the cervical spine were also generated.  COMPARISON:  04/24/2014  FINDINGS: CT HEAD FINDINGS  Ventricles are enlarged, to a greater degree than the sulci, but are stable from the prior CT. Findings are consistent with advanced atrophy.  There are no parenchymal masses or mass effect.  There are multiple old infarcts involving both frontal lobes and the right external capsule  on the left thalamus. White matter hypoattenuation is seen more diffusely consistent with moderate to advanced chronic microvascular ischemic change.  There is no evidence of a recent cortical infarct.  There are no extra-axial masses or abnormal fluid collections.  There is no intracranial hemorrhage.  No skull fracture. Moderate mucosal thickening lines the ethmoid air cells. There  is mild maxillary sinus mucosal thickening. Mucous retention cyst lies in the right sphenoid sinus. Clear mastoid air cells.  CT CERVICAL SPINE FINDINGS  No fracture. No spondylolisthesis. There is marked loss of disc height at C5-C6. Moderate loss disc height is noted at C6-C7. There is mild to moderate loss of disc height at C2-C3 and C3-C4. Facet degenerative changes noted bilaterally. There is acquired fusion of the right facets at C2-C3 and C3-C4. Bones are diffusely demineralized. There multiple levels of neural foraminal narrowing. Soft tissues are unremarkable. Scarring is noted at the lung apices.  IMPRESSION: HEAD CT: No acute intracranial abnormalities. Advanced atrophy, chronic microvascular ischemic change and multiple old infarcts.  CERVICAL CT: No fracture or acute finding. Advanced degenerative changes.   Electronically Signed   By: Amie Portland M.D.   On: 01/03/2015 15:05   Dg Knee Complete 4 Views Right  01/03/2015   CLINICAL DATA:  Fall, acute anterior right knee pain  EXAM: RIGHT KNEE - COMPLETE 4+ VIEW  COMPARISON:  None.  FINDINGS: Bones are osteopenic. Moderate tricompartmental osteoarthritis with joint space loss, sclerosis and bony spurring. Atrophy of the extremity musculature. Normal alignment without acute fracture or effusion.  IMPRESSION: Tricompartmental osteoarthritis.  Osteopenia.  No acute finding by plain radiography.   Electronically Signed   By: Judie Petit.  Shick M.D.   On: 01/03/2015 14:38     EKG: (Independently reviewed) atrial fibrillation with ventricular rate 71 bpm, QTC 501, no ST segment or T-wave changes that would be concerning for ischemia ms   Assessment & Plan  Principal Problem:   Altered mental status -Admit to telemetry/observational status -Differential includes:  a) CVA-check MRI  b) Infection mediated-urinalysis unremarkable except for mild leukocytosis but recent Escherichia coli UTI so check urine culture  c) medication side effects-Namenda has been  documented to cause confusion and hallucinations and this was just started on 9/13, recent start Deseryl 9/2 but this med does not cause AMS -No focal neurological deficits other than the mentation issues and slightly garbled speech which is not appreciated on my exam but daughter reports speech is "not back to normal"-if MRI does show new acute stroke recommend completing stroke workup and consulting neurology formally -No medication records were sent from the nursing home so I have asked secretary in the ER to have these faxed over for review -At this juncture utilizing only Tylenol for pain  Active Problems:   Dehydration/Acute kidney injury/CKD, stage III -Mild in nature and patient with documented poor intake in relation to her underlying dementia and known failure to thrive -Gentle IV fluid hydration -Follow labs -Follow up on CPK chest pending   History bradycardia and sinus pauses -Loop recorder interrogated in ER and patient continues with issues with recurrent bradycardia and sinus pauses-see progress notes by ER RN    Elevated troponin -Point-of-care elevated and serum troponin pending -EKG unremarkable -Patient with recent chest burning and reflux symptoms and started on Prilosec; if troponins continue to trend upward these symptoms may be more representative of ischemia -Echocardiogram completed January 2016 and was without regional wall motion abnormalities so would only repeat if troponins return strongly positive -Patient  not a candidate for aggressive cardiovascular evaluation given advanced age, dementia and so to thrive -Medical therapy also limited by recent issues with symptomatic bradycardia and sinus pulses     Dementia with Lewy bodies -Namenda on hold secondary to concerns contribute to patient's acute altered mentation -Patient has tolerated nocturnal trazodone for a period of time and I have continued this for hour of sleep    HLD  -Based on old medications from  most recent admission patient was not taking a statin prior to admission but need to confirm once current nursing home medication sheets are available -Based on last admission patient was taking omega-3 fatty acids which I have resumed    Elevated transaminase level -Likely related to dehydration -Repeat in a.m.    Acid reflux -Continue Prilosec    Hemiparesis 2/2 stroke/ History of CVA  -Continue to use Rollator with ambulation and always assist patient out of bed to chair or with other activities out of bed   Chronic LBP -Previously on short term Vicodin but this was dc'd upon return to SNF and she is now on prn Tylenol    FTT in adult -Likely related to underlying dementia -Provide protein supplementation    Prolonged QT interval -Was not present on previous EKG and potentially rate related -Repeat EKG in a.m. -Avoid offending medications such as fluoroquinolones, Zofran and Haldol -Trazadone can cause QT prolongation- new prolongation since last admit  And likley 2/2 this med so will decrease dose to 25 mg and follow QTc/should not abruptly dc Trazadone    DVT Prophylaxis: Lovenox  Family Communication:   Daughter at bedside  Code Status: DO NOT RESUSCITATE   Condition:  Stable  Discharge disposition:  Anticipate return to skilled nursing facility pending improvement in/resolution of altered mentation and determination of etiology to altered mentation  Time spent in minutes : 60      ELLIS,ALLISON L. ANP on 01/03/2015 at 4:23 PM  Between 7am to 7pm - Pager - [phone]  After 7pm go to [email or web address] - password [email or web address]  And look for the night coverage person covering me after hours  [facility]

## 2015-01-03 NOTE — ED Notes (Signed)
Pt comes from facility with a fall. Pt was LSN at 0730. Pt was found on floor at the bottom of her bed. VSS. 20g IV Left hand saline locked. Pt pleasantly confused and A&OX1. NAD noted,

## 2015-01-03 NOTE — ED Notes (Addendum)
Spoke with [employer] representative about pt's results from her loop recorder.  They state last reading was on 7/24.  Since then pt has had 679 pauses (states pt appears to be in bigeminy, which showed on pt's EKG today) and has had 40 episodes of brady readings and 7 episodes of a-fib.  Most recent episode was yesterday (9/16) and was a recorded pause. NP made aware.

## 2015-01-03 NOTE — ED Notes (Signed)
Seizure pads placed on the bed to keep pt from being intertwined in the side rails (pt safety)

## 2015-01-03 NOTE — ED Notes (Signed)
MRI made aware pt has had loop recorder interrogated.

## 2015-01-03 NOTE — ED Provider Notes (Signed)
CSN: [id number]     Arrival date & time 01/03/15  1146 History   First MD Initiated Contact with Patient 01/03/15 1152     Chief Complaint  Patient presents with  . Fall   HPI   Level V caveat due to altered mental status  81 year old female with a history of CVA, dementia, memory deficit presents today after fall and with altered mental status. Patient was brought via EMS, patient's daughter was bedside during evaluation. EMS reports they were called out for a fall, patient was sitting at the end of her bed, staff notes that they saw her in her bed at approximately 7:30 this morning, and went to check on her around 10:30 AM. Staff notes that patient was "talking funny" at that time. Uncertain if she was at her baseline at the 7:30 check or if last night was the last known normal. Daughter notes a significant past medical history of CVA with left-sided hemiparesis,the patient uses a rolling walker, but has been progressing to wheelchair bound. Daughter notes that she does have some issues with memory and dementia, but notes during my evaluation the patient is "not at her baseline", her speech is garbled, and is talking about things that are present in the room. Patient reports to me she does not recall falling, cannot recall data week, year, or initially daughter's first name. She reports seeing "ants on the windowsill" while pointing to the wall in the exam room. Patient reports pain to her neck, thoracic spine, right knee.   Pt does have a loop recorder     Past Medical History  Diagnosis Date  . Memory deficit 10/17/2012  . Abnormality of gait 10/17/2012  . Urinary incontinence   . Hypertension   . Dyslipidemia   . Spinal stenosis   . Hemiparesis and alteration of sensations as late effects of stroke 06/24/2014  . Anemia   . Stroke 2000's    mild left hemiparesis  . Lumbosacral spondylosis   . Arthritis     "knees" (12/12/2014)  . Spinal stenosis, lumbar   . Alzheimer disease    Past  Surgical History  Procedure Laterality Date  . Tubal ligation Bilateral   . Cataract extraction w/ intraocular lens  implant, bilateral Bilateral   . Ep implantable device N/A 09/08/2014    Procedure: Loop Recorder Insertion;  Surgeon: Marinus Maw, MD;  Location: [facility];  Service: Cardiovascular;  Laterality: N/A;   Family History  Problem Relation Age of Onset  . Parkinsonism Brother   . Dementia Brother   . Heart disease Brother    Social History  Substance Use Topics  . Smoking status: Never Smoker   . Smokeless tobacco: Never Used  . Alcohol Use: 4.2 oz/week    7 Glasses of wine per week     Comment: 1 glass of wine per day   OB History    No data available     Review of Systems  All other systems reviewed and are negative.   Allergies  Alendronate sodium and Simvastatin  Home Medications   Prior to Admission medications   Medication Sig Start Date End Date Taking? Authorizing Provider  acetaminophen (TYLENOL) 500 MG tablet Take 500 mg by mouth every 6 (six) hours as needed.    Historical Provider, MD  aspirin 81 MG tablet Take 81 mg by mouth daily.    Historical Provider, MD  cephALEXin (KEFLEX) 500 MG capsule Take 1 capsule (500 mg total) by mouth 3 (  three) times daily. 12/13/14   Shanker Levora Dredge, MD  Cranberry 450 MG CAPS Take 1 tablet by mouth 2 (two) times daily.     Historical Provider, MD  diphenhydrAMINE (SOMINEX) 25 MG tablet Take 25 mg by mouth every 8 (eight) hours as needed for itching or sleep.     Historical Provider, MD  feeding supplement (BOOST HIGH PROTEIN) LIQD Take 1 Container by mouth 2 (two) times daily between meals.    Historical Provider, MD  HYDROcodone-acetaminophen (NORCO/VICODIN) 5-325 MG per tablet Take 1 tablet by mouth 2 (two) times daily. Take twice every day for OA per [facility] 12/13/14   Shanker Levora Dredge, MD  ketoconazole (NIZORAL) 2 % shampoo Apply 1 application topically 2 (two) times a week.    Historical Provider, MD   loratadine (CLARITIN) 10 MG tablet Take 10 mg by mouth daily.    Historical Provider, MD  Melatonin 3 MG CAPS Take 3 mg by mouth at bedtime.    Historical Provider, MD  memantine [facility] TITRATION PAK) tablet pack 5 mg/day for =1 week; 5 mg twice daily for =1 week; 15 mg/day given in 5 mg and 10 mg separated doses for =1 week; then 10 mg twice daily 12/30/14   Butch Penny, NP  Omega-3 Fatty Acids (FISH OIL) 1000 MG CAPS Take 1,000 mg by mouth daily.    Historical Provider, MD  polyethylene glycol (MIRALAX / GLYCOLAX) packet Take 17 g by mouth every other day. 12/13/14   Shanker Levora Dredge, MD  traZODone (DESYREL) 50 MG tablet Take 50 mg by mouth at bedtime.    Historical Provider, MD   BP 155/62 mmHg  Pulse 73  Temp(Src) 98.4 F (36.9 C) (Oral)  Resp 20  Ht 5\' 3"  (1.6 m)  Wt 111 lb (50.349 kg)  BMI 19.67 kg/m2  SpO2 99%    Physical Exam  Constitutional: She is oriented to person, place, and time. She appears well-developed and well-nourished.  HENT:  Head: Normocephalic and atraumatic.  Small area of redness to right anterior forehead  Eyes: Conjunctivae are normal. Pupils are equal, round, and reactive to light. Right eye exhibits no discharge. Left eye exhibits no discharge. No scleral icterus.  Neck: Normal range of motion. No JVD present. No tracheal deviation present.  Cardiovascular: Normal heart sounds and intact distal pulses.  Exam reveals no friction rub.   No murmur heard. Pulmonary/Chest: Effort normal. No stridor.  Abdominal: Soft. She exhibits no distension. There is no tenderness.  Musculoskeletal:  Tenderness to palpation of left cervical soft tissue, tenderness to palpation of the thoracic spine, no obvious deformities or signs of trauma. Hips stable full range of motion without pain, internal/external rotation of the femur with no pain. Right knee tender to palpation, pain with flexion, no obvious signs of trauma.  Neurological: She is alert and oriented to person,  place, and time. Coordination normal.  Upper and lower extremity strength 5 out of 5, grip strength 5 out of 5, decreased strength to the left lower extremity  Psychiatric: She has a normal mood and affect. Her behavior is normal. Judgment and thought content normal.  Nursing note and vitals reviewed.     ED Course  Procedures (including critical care time) Labs Review Labs Reviewed  ETHANOL  PROTIME-INR  APTT  CBC  DIFFERENTIAL  COMPREHENSIVE METABOLIC PANEL  URINE RAPID DRUG SCREEN, HOSP PERFORMED  URINALYSIS, ROUTINE W REFLEX MICROSCOPIC (NOT AT [place])  I-STAT CHEM 8, ED  Rosezena Sensor, ED  Imaging Review No results found. I have personally reviewed and evaluated these images and lab results as part of my medical decision-making.   EKG Interpretation None      MDM   Final diagnoses:  Altered mental status, unspecified altered mental status type    Labs: I-STAT troponin, urine rapid drug screen, urinalysis, ethanol, PT/INR, CBC, CMP, i-STAT Chem-8 - troponin 0.11, creatinine 1.22,  Imaging: CT head without contrast, CT cervical spine, DG knee, DG thoracic spine- no acute findings  Consults: [facility] hospitalist  Therapeutics: Normal saline  Discharge Meds:   Assessment/Plan: 81 year old female with altered mental status. Patient's daughter is present at bedside and reports that her speech is garbled, with an acute change in her mental status including visual hallucinations. Uncertain etiology of patient's current symptoms, no significant findings on labs or diagnostic imaging. Patient admitted to hospitalist service for continued altered mental status workup.         Eyvonne Mechanic, PA-C 01/03/15 2010  Glynn Octave, MD 01/04/15 [phone]

## 2015-01-03 NOTE — ED Notes (Signed)
Pt back from MRI and xray and being transported upstairs at this time.

## 2015-01-03 NOTE — ED Notes (Signed)
Admitting at bedside 

## 2015-01-03 NOTE — ED Notes (Signed)
Patient transported to X-ray 

## 2015-01-03 NOTE — ED Notes (Addendum)
This RN went in to room.  Pt's daughter states pt's hallucinations are worsening.  Will make MD aware.

## 2015-01-03 NOTE — ED Notes (Signed)
Pt continues to state she cannot urinate on a bedpan.  This RN assisted patient in positioning on a bedside commode.  Pt very unsteady on feet, and somewhat uncoordinated.  Pt unable to urinate once on commode.  Pt aggravated with RN's request to place her back in bed.  Pt back in bed, both side rails up, fresh warm blankets applied.  Pt sts she is comfortable at this time.

## 2015-01-04 DIAGNOSIS — I4581 Long QT syndrome: Secondary | ICD-10-CM

## 2015-01-04 DIAGNOSIS — N179 Acute kidney failure, unspecified: Secondary | ICD-10-CM | POA: Diagnosis not present

## 2015-01-04 DIAGNOSIS — N183 Chronic kidney disease, stage 3 (moderate): Secondary | ICD-10-CM

## 2015-01-04 DIAGNOSIS — R740 Nonspecific elevation of levels of transaminase and lactic acid dehydrogenase [LDH]: Secondary | ICD-10-CM | POA: Diagnosis present

## 2015-01-04 DIAGNOSIS — I495 Sick sinus syndrome: Secondary | ICD-10-CM | POA: Diagnosis present

## 2015-01-04 DIAGNOSIS — R4182 Altered mental status, unspecified: Secondary | ICD-10-CM

## 2015-01-04 DIAGNOSIS — Z993 Dependence on wheelchair: Secondary | ICD-10-CM | POA: Diagnosis not present

## 2015-01-04 DIAGNOSIS — M4806 Spinal stenosis, lumbar region: Secondary | ICD-10-CM | POA: Diagnosis present

## 2015-01-04 DIAGNOSIS — R627 Adult failure to thrive: Secondary | ICD-10-CM | POA: Diagnosis present

## 2015-01-04 DIAGNOSIS — I69354 Hemiplegia and hemiparesis following cerebral infarction affecting left non-dominant side: Secondary | ICD-10-CM | POA: Diagnosis not present

## 2015-01-04 DIAGNOSIS — F0280 Dementia in other diseases classified elsewhere without behavioral disturbance: Secondary | ICD-10-CM

## 2015-01-04 DIAGNOSIS — I6340 Cerebral infarction due to embolism of unspecified cerebral artery: Secondary | ICD-10-CM | POA: Diagnosis not present

## 2015-01-04 DIAGNOSIS — G8929 Other chronic pain: Secondary | ICD-10-CM | POA: Diagnosis present

## 2015-01-04 DIAGNOSIS — K219 Gastro-esophageal reflux disease without esophagitis: Secondary | ICD-10-CM

## 2015-01-04 DIAGNOSIS — W19XXXA Unspecified fall, initial encounter: Secondary | ICD-10-CM | POA: Diagnosis present

## 2015-01-04 DIAGNOSIS — Z66 Do not resuscitate: Secondary | ICD-10-CM | POA: Diagnosis present

## 2015-01-04 DIAGNOSIS — R7989 Other specified abnormal findings of blood chemistry: Secondary | ICD-10-CM

## 2015-01-04 DIAGNOSIS — T438X5A Adverse effect of other psychotropic drugs, initial encounter: Secondary | ICD-10-CM | POA: Diagnosis present

## 2015-01-04 DIAGNOSIS — Z7982 Long term (current) use of aspirin: Secondary | ICD-10-CM | POA: Diagnosis not present

## 2015-01-04 DIAGNOSIS — G3183 Dementia with Lewy bodies: Secondary | ICD-10-CM | POA: Diagnosis present

## 2015-01-04 DIAGNOSIS — R32 Unspecified urinary incontinence: Secondary | ICD-10-CM | POA: Diagnosis present

## 2015-01-04 DIAGNOSIS — D72829 Elevated white blood cell count, unspecified: Secondary | ICD-10-CM | POA: Diagnosis present

## 2015-01-04 DIAGNOSIS — Z8673 Personal history of transient ischemic attack (TIA), and cerebral infarction without residual deficits: Secondary | ICD-10-CM

## 2015-01-04 DIAGNOSIS — G309 Alzheimer's disease, unspecified: Secondary | ICD-10-CM | POA: Diagnosis present

## 2015-01-04 DIAGNOSIS — E860 Dehydration: Secondary | ICD-10-CM | POA: Diagnosis present

## 2015-01-04 DIAGNOSIS — I69398 Other sequelae of cerebral infarction: Secondary | ICD-10-CM

## 2015-01-04 DIAGNOSIS — E785 Hyperlipidemia, unspecified: Secondary | ICD-10-CM | POA: Diagnosis present

## 2015-01-04 DIAGNOSIS — M545 Low back pain: Secondary | ICD-10-CM | POA: Diagnosis present

## 2015-01-04 DIAGNOSIS — I4891 Unspecified atrial fibrillation: Secondary | ICD-10-CM | POA: Diagnosis present

## 2015-01-04 DIAGNOSIS — R4781 Slurred speech: Secondary | ICD-10-CM | POA: Diagnosis present

## 2015-01-04 DIAGNOSIS — I69359 Hemiplegia and hemiparesis following cerebral infarction affecting unspecified side: Secondary | ICD-10-CM

## 2015-01-04 DIAGNOSIS — I129 Hypertensive chronic kidney disease with stage 1 through stage 4 chronic kidney disease, or unspecified chronic kidney disease: Secondary | ICD-10-CM | POA: Diagnosis present

## 2015-01-04 DIAGNOSIS — R748 Abnormal levels of other serum enzymes: Secondary | ICD-10-CM | POA: Diagnosis present

## 2015-01-04 DIAGNOSIS — R001 Bradycardia, unspecified: Secondary | ICD-10-CM | POA: Diagnosis not present

## 2015-01-04 DIAGNOSIS — M6282 Rhabdomyolysis: Secondary | ICD-10-CM | POA: Diagnosis present

## 2015-01-04 DIAGNOSIS — R441 Visual hallucinations: Secondary | ICD-10-CM | POA: Diagnosis present

## 2015-01-04 DIAGNOSIS — G92 Toxic encephalopathy: Secondary | ICD-10-CM | POA: Diagnosis present

## 2015-01-04 LAB — COMPREHENSIVE METABOLIC PANEL
ALT: 26 U/L (ref 14–54)
ANION GAP: 10 (ref 5–15)
AST: 42 U/L — ABNORMAL HIGH (ref 15–41)
Albumin: 3.3 g/dL — ABNORMAL LOW (ref 3.5–5.0)
Alkaline Phosphatase: 56 U/L (ref 38–126)
BILIRUBIN TOTAL: 1.1 mg/dL (ref 0.3–1.2)
BUN: 20 mg/dL (ref 6–20)
CHLORIDE: 109 mmol/L (ref 101–111)
CO2: 21 mmol/L — ABNORMAL LOW (ref 22–32)
Calcium: 9.3 mg/dL (ref 8.9–10.3)
Creatinine, Ser: 1.13 mg/dL — ABNORMAL HIGH (ref 0.44–1.00)
GFR, EST AFRICAN AMERICAN: 51 mL/min — AB (ref >60)
GFR, EST NON AFRICAN AMERICAN: 44 mL/min — AB (ref >60)
Glucose, Bld: 116 mg/dL — ABNORMAL HIGH (ref 65–99)
POTASSIUM: 4.4 mmol/L (ref 3.5–5.1)
Sodium: 140 mmol/L (ref 135–145)
TOTAL PROTEIN: 5.7 g/dL — AB (ref 6.5–8.1)

## 2015-01-04 LAB — CBC
HEMATOCRIT: 33.2 % — AB (ref 36.0–46.0)
Hemoglobin: 10.8 g/dL — ABNORMAL LOW (ref 12.0–15.0)
MCH: 30.9 pg (ref 26.0–34.0)
MCHC: 32.5 g/dL (ref 30.0–36.0)
MCV: 95.1 fL (ref 78.0–100.0)
PLATELETS: 229 10*3/uL (ref 150–400)
RBC: 3.49 MIL/uL — ABNORMAL LOW (ref 3.87–5.11)
RDW: 13.5 % (ref 11.5–15.5)
WBC: 7.6 10*3/uL (ref 4.0–10.5)

## 2015-01-04 LAB — TROPONIN I
TROPONIN I: 0.25 ng/mL — AB (ref <0.031)
Troponin I: 0.15 ng/mL — ABNORMAL HIGH (ref <0.031)

## 2015-01-04 NOTE — Consult Note (Addendum)
Name: Tracey [name] is a 81 y.o. female Admit date: 01/03/2015 Referring Physician:  [facility] hospitalists/[place] Primary Physician:  Dewayne Hatch Primary Cardiologist:  Lewayne Bunting  Reason for Consultation:  Elevated troponin  ASSESSMENT: 1. Elevated troponin of uncertain significance, doubt ACS 2. Bradycardia with atrial bigeminy and probable sick sinus syndrome. The patient has a loop recorder which we will interrogate 3. Alzheimer's disease 4. Prior history of stroke with residual left hemiparesis. 5. No QT prolongation is noted  PLAN:  1. Cycle cardiac markers. Plan no ischemic evaluation unless clinical course dictates a more aggressive approach. Please note that the patient is designated as DO NOT RESUSCITATE 2. We'll have Loop recorder interrogated.   HPI: 81 year old who is unable to give history. She says hospitalization occurred because "her daughter wanted her admitted". She doesn't know what happened. She has no particular complaints. She does recall that she has a loop recorder. She does not think she fainted. She has not had chest discomfort. There is no prior history of coronary disease/myocardial infarction.  PMH:   Past Medical History  Diagnosis Date  . Memory deficit 10/17/2012  . Abnormality of gait 10/17/2012  . Urinary incontinence   . Hypertension   . Dyslipidemia   . Spinal stenosis   . Hemiparesis and alteration of sensations as late effects of stroke 06/24/2014  . Anemia   . Stroke 2000's    mild left hemiparesis  . Lumbosacral spondylosis   . Arthritis     "knees" (12/12/2014)  . Spinal stenosis, lumbar   . Alzheimer disease     PSH:   Past Surgical History  Procedure Laterality Date  . Tubal ligation Bilateral   . Cataract extraction w/ intraocular lens  implant, bilateral Bilateral   . Ep implantable device N/A 09/08/2014    Procedure: Loop Recorder Insertion;  Surgeon: Marinus Maw, MD;  Location: [facility];  Service: Cardiovascular;   Laterality: N/A;   Allergies:  Alendronate sodium and Simvastatin Prior to Admit Meds:   Prescriptions prior to admission  Medication Sig Dispense Refill Last Dose  . acetaminophen (TYLENOL) 500 MG tablet Take 500 mg by mouth every 6 (six) hours as needed.   01/03/2015 at Unknown time  . aspirin 81 MG tablet Take 81 mg by mouth daily.   01/03/2015 at Unknown time  . Cranberry 450 MG CAPS Take 1 tablet by mouth 2 (two) times daily.    01/03/2015 at Unknown time  . diphenhydrAMINE (SOMINEX) 25 MG tablet Take 25 mg by mouth every 8 (eight) hours as needed for itching or sleep.    Past Week at Unknown time  . feeding supplement (BOOST HIGH PROTEIN) LIQD Take 1 Container by mouth 2 (two) times daily between meals.   01/03/2015 at Unknown time  . HYDROcodone-acetaminophen (NORCO/VICODIN) 5-325 MG per tablet Take 1 tablet by mouth 2 (two) times daily. Take twice every day for OA per MAR 15 tablet 0 Past Week at Unknown time  . loratadine (CLARITIN) 10 MG tablet Take 10 mg by mouth daily.   01/03/2015 at Unknown time  . Melatonin 3 MG CAPS Take 3 mg by mouth at bedtime.   01/02/2015 at Unknown time  . Omega-3 Fatty Acids (FISH OIL) 1000 MG CAPS Take 1,000 mg by mouth daily.   01/03/2015 at Unknown time  . omeprazole (PRILOSEC) 20 MG capsule Take 20 mg by mouth daily.   01/03/2015 at Unknown time  . polyethylene glycol (MIRALAX / GLYCOLAX) packet Take 17 g  by mouth every other day. 14 each 0 01/02/2015 at Unknown time  . traZODone (DESYREL) 50 MG tablet Take 50 mg by mouth at bedtime.   01/02/2015 at Unknown time  . zinc oxide 20 % ointment Apply 1 application topically as needed for irritation.   01/03/2015 at Unknown time  . cephALEXin (KEFLEX) 500 MG capsule Take 1 capsule (500 mg total) by mouth 3 (three) times daily. (Patient not taking: Reported on 01/03/2015) 9 capsule 0 Completed Course at Unknown time  . ketoconazole (NIZORAL) 2 % shampoo Apply 1 application topically 2 (two) times a week.   unknown  .  memantine ([place]EN[place] TITRATION PAK) tablet pack 5 mg/day for =1 week; 5 mg twice daily for =1 week; 15 mg/day given in 5 mg and 10 mg separated doses for =1 week; then 10 mg twice daily 49 tablet 0 unknown   Fam HX:    Family History  Problem Relation Age of Onset  . Parkinsonism Brother   . Dementia Brother   . Heart disease Brother    Social HX:    Social History   Social History  . Marital Status: Divorced    Spouse Name: N/A  . Number of Children: 2  . Years of Education: 16   Occupational History  . Retired Charity fundraiser    Social History Main Topics  . Smoking status: Never Smoker   . Smokeless tobacco: Never Used  . Alcohol Use: 4.2 oz/week    7 Glasses of wine per week     Comment: 1 glass of wine per day  . Drug Use: No  . Sexual Activity: Not on file   Other Topics Concern  . Not on file   Social History Narrative   Lives alone in one story home.  Has 2 children.  Retired Charity fundraiser.       Review of Systems: Denies headache. Denies shortness of breath. No lower extremity swelling. Denies headache, nausea, vomiting. Chronic left sided weakness.  Physical Exam: Blood pressure 127/53, pulse 96, temperature 98.6 F (37 C), temperature source Oral, resp. rate 18, height 5\' 3"  (1.6 m), weight 50.576 kg (111 lb 8 oz), SpO2 99 %. Weight change:    Frail appearing. Sitting in chair. No acute distress. HEENT exam is unremarkable. No pallor is noted Neck reveals no JVD or carotid bruits. Thyroid is not palpable Chest is clear to auscultation and percussion Cardiac exam reveals no murmur or gallop Abdomen is soft. Bowel sounds are normal. Extremities reveal no edema. Neurological exam reveals flat affect. Decreased memory. Left-sided weakness.  Labs: Lab Results  Component Value Date   WBC 7.6 01/04/2015   HGB 10.8* 01/04/2015   HCT 33.2* 01/04/2015   MCV 95.1 01/04/2015   PLT 229 01/04/2015    Recent Labs Lab 01/04/15 0845  NA 140  K 4.4  CL 109  CO2 21*  BUN 20    CREATININE 1.13*  CALCIUM 9.3  PROT 5.7*  BILITOT 1.1  ALKPHOS 56  ALT 26  AST 42*  GLUCOSE 116*   No results found for: PTT Lab Results  Component Value Date   INR 1.14 01/03/2015   INR 1.15 04/24/2014   Lab Results  Component Value Date   CKTOTAL 1050* 01/03/2015   TROPONINI 0.15* 01/04/2015     Lab Results  Component Value Date   CHOL 199 04/25/2014   Lab Results  Component Value Date   HDL 75 04/25/2014   Lab Results  Component Value Date  LDLCALC 100* 04/25/2014   Lab Results  Component Value Date   TRIG 122 04/25/2014   Lab Results  Component Value Date   CHOLHDL 2.7 04/25/2014   No results found for: LDLDIRECT    Radiology:  Dg Ribs Bilateral W/chest  01/03/2015   CLINICAL DATA:  Status post fall from bed with left-sided back and thoracic spine pain.  EXAM: BILATERAL RIBS AND CHEST - 4+ VIEW  COMPARISON:  None.  FINDINGS: No fracture or other bone lesions are seen involving the ribs. There is no evidence of pneumothorax or pleural effusion. Both lungs are clear. Heart size and mediastinal contours are within normal limits. An electronic apparatus overlies the left lower thorax.  IMPRESSION: No evidence of displaced rib fractures.   Electronically Signed   By: Ted Mcalpine M.D.   On: 01/03/2015 19:44   Dg Thoracic Spine W/swimmers  01/03/2015   CLINICAL DATA:  Back pain status post fall.  EXAM: THORACIC SPINE - 3 VIEWS  COMPARISON:  None.  FINDINGS: There is no evidence of thoracic spine fracture. There is thoracic spine kyphosis, likely secondary to osteoarthritic changes. Mild height loss of T11, T12, and L1 is noted, likely related to degenerative changes. No other significant bone abnormalities are identified. Electronic apparatus overlies the lower left thorax.  IMPRESSION: No evidence of displaced fracture of the thoracic spine.  Osteoarthritic changes, with likely degenerative height loss of T11, T12 and L1 vertebral bodies. Please correlate to  patient's clinical exam.   Electronically Signed   By: Ted Mcalpine M.D.   On: 01/03/2015 14:41   Ct Head Wo Contrast  01/03/2015   CLINICAL DATA:  Patient fell. Found at the foot upper been. Confused with neck pain.  EXAM: CT HEAD WITHOUT CONTRAST  CT CERVICAL SPINE WITHOUT CONTRAST  TECHNIQUE: Multidetector CT imaging of the head and cervical spine was performed following the standard protocol without intravenous contrast. Multiplanar CT image reconstructions of the cervical spine were also generated.  COMPARISON:  04/24/2014  FINDINGS: CT HEAD FINDINGS  Ventricles are enlarged, to a greater degree than the sulci, but are stable from the prior CT. Findings are consistent with advanced atrophy.  There are no parenchymal masses or mass effect.  There are multiple old infarcts involving both frontal lobes and the right external capsule on the left thalamus. White matter hypoattenuation is seen more diffusely consistent with moderate to advanced chronic microvascular ischemic change.  There is no evidence of a recent cortical infarct.  There are no extra-axial masses or abnormal fluid collections.  There is no intracranial hemorrhage.  No skull fracture. Moderate mucosal thickening lines the ethmoid air cells. There is mild maxillary sinus mucosal thickening. Mucous retention cyst lies in the right sphenoid sinus. Clear mastoid air cells.  CT CERVICAL SPINE FINDINGS  No fracture. No spondylolisthesis. There is marked loss of disc height at C5-C6. Moderate loss disc height is noted at C6-C7. There is mild to moderate loss of disc height at C2-C3 and C3-C4. Facet degenerative changes noted bilaterally. There is acquired fusion of the right facets at C2-C3 and C3-C4. Bones are diffusely demineralized. There multiple levels of neural foraminal narrowing. Soft tissues are unremarkable. Scarring is noted at the lung apices.  IMPRESSION: HEAD CT: No acute intracranial abnormalities. Advanced atrophy, chronic  microvascular ischemic change and multiple old infarcts.  CERVICAL CT: No fracture or acute finding. Advanced degenerative changes.   Electronically Signed   By: Amie Portland M.D.   On: 01/03/2015 15:05  Ct Cervical Spine Wo Contrast  01/03/2015   CLINICAL DATA:  Patient fell. Found at the foot upper been. Confused with neck pain.  EXAM: CT HEAD WITHOUT CONTRAST  CT CERVICAL SPINE WITHOUT CONTRAST  TECHNIQUE: Multidetector CT imaging of the head and cervical spine was performed following the standard protocol without intravenous contrast. Multiplanar CT image reconstructions of the cervical spine were also generated.  COMPARISON:  04/24/2014  FINDINGS: CT HEAD FINDINGS  Ventricles are enlarged, to a greater degree than the sulci, but are stable from the prior CT. Findings are consistent with advanced atrophy.  There are no parenchymal masses or mass effect.  There are multiple old infarcts involving both frontal lobes and the right external capsule on the left thalamus. White matter hypoattenuation is seen more diffusely consistent with moderate to advanced chronic microvascular ischemic change.  There is no evidence of a recent cortical infarct.  There are no extra-axial masses or abnormal fluid collections.  There is no intracranial hemorrhage.  No skull fracture. Moderate mucosal thickening lines the ethmoid air cells. There is mild maxillary sinus mucosal thickening. Mucous retention cyst lies in the right sphenoid sinus. Clear mastoid air cells.  CT CERVICAL SPINE FINDINGS  No fracture. No spondylolisthesis. There is marked loss of disc height at C5-C6. Moderate loss disc height is noted at C6-C7. There is mild to moderate loss of disc height at C2-C3 and C3-C4. Facet degenerative changes noted bilaterally. There is acquired fusion of the right facets at C2-C3 and C3-C4. Bones are diffusely demineralized. There multiple levels of neural foraminal narrowing. Soft tissues are unremarkable. Scarring is noted  at the lung apices.  IMPRESSION: HEAD CT: No acute intracranial abnormalities. Advanced atrophy, chronic microvascular ischemic change and multiple old infarcts.  CERVICAL CT: No fracture or acute finding. Advanced degenerative changes.   Electronically Signed   By: Amie Portland M.D.   On: 01/03/2015 15:05   Mr Brain Wo Contrast  01/03/2015   CLINICAL DATA:  Dementia with combative behavior. Fell earlier today. Hallucinating. Agitation.  EXAM: MRI HEAD WITHOUT CONTRAST  TECHNIQUE: Multiplanar, multiecho pulse sequences of the brain and surrounding structures were obtained without intravenous contrast.  COMPARISON:  CT earlier same day  FINDINGS: The study suffers from motion degradation. The patient would not allow scanning beyond the diffusion imaging and 8 markedly motion degraded T2 exam.  There is no evidence of acute infarction, mass lesion, hemorrhage, obstructive hydrocephalus or extra-axial collection. There is an old left thalamic infarction. There are chronic small-vessel ischemic changes throughout the white matter. Enlargement of the ventricles is presumed secondary to central atrophy. There are old bifrontal cortical and subcortical infarctions as seen previously.  IMPRESSION: Markedly motion degraded and foreshortened exam because of the patient's condition. No sign of acute infarction. Atrophy and chronic small vessel ischemic changes. Old bifrontal cortical and subcortical infarctions.   Electronically Signed   By: Paulina Fusi M.D.   On: 01/03/2015 18:45   Dg Knee Complete 4 Views Right  01/03/2015   CLINICAL DATA:  Fall, acute anterior right knee pain  EXAM: RIGHT KNEE - COMPLETE 4+ VIEW  COMPARISON:  None.  FINDINGS: Bones are osteopenic. Moderate tricompartmental osteoarthritis with joint space loss, sclerosis and bony spurring. Atrophy of the extremity musculature. Normal alignment without acute fracture or effusion.  IMPRESSION: Tricompartmental osteoarthritis.  Osteopenia.  No acute  finding by plain radiography.   Electronically Signed   By: Judie Petit.  Shick M.D.   On: 01/03/2015 14:38    EKG:  Atrial bigeminy without ischemic change.    Lesleigh Noe 01/04/2015 1:11 PM

## 2015-01-04 NOTE — Progress Notes (Signed)
[facility] PROGRESS NOTE   Tracey [name] ZOX:[id number] DOB: 30-May-1930 DOA: 01/03/2015 PCP: Darrow Bussing, MD  HPI/Subjective: Awake and alert but confused, does not remember very well what happened yesterday. Denies any chest pain or shortness of breath.  Assessment/Plan: Principal Problem:   Altered mental status Active Problems:   Dementia with Lewy bodies   HLD (hyperlipidemia)   Hemiparesis and alteration of sensations as late effects of stroke   FTT (failure to thrive) in adult   History of CVA (cerebrovascular accident)   Dehydration   Acute kidney injury   Elevated transaminase level   Elevated troponin   Acid reflux   CKD (chronic kidney disease), stage III   Prolonged QT interval    Altered mental status -Patient presented to the hospital with altered mental status, MRI was done showed no acute events. -Urinalysis was done as well as chest x-ray no evidence of infection, no fever no leukocytosis. -Cannot rule out medications as cause of her altered mental status including Namenda and Desyrel. -Patient today awake and alert, sounds back to normal but will check her baseline with her daughter.  Elevated troponin -Troponin is 0.16 with peak of 0.25., Currently denied any chest pain. -EKG does not show any evidence of ischemia. -Was not started on beta blockers because of history of pauses/bradycardia.  Dehydration/Acute kidney injury/CKD, stage III -Mild in nature and patient with documented poor intake in relation to her underlying dementia and known failure to thrive -Gentle IV fluid hydration -Follow labs -Follow up on CPK chest pending  History bradycardia and sinus pauses -Loop recorder interrogated in ER and patient continues with issues with recurrent bradycardia and sinus pauses-see progress notes by ER RN -Cardiology to evaluate.  Dementia with Lewy bodies -Namenda on hold secondary to concerns contribute to patient's acute altered  mentation -Patient has tolerated nocturnal trazodone for a period of time and I have continued this for hour of sleep  HLD  -Based on last admission patient was taking omega-3 fatty acids which I have resumed  Elevated transaminase level -Likely related to dehydration -Repeat in a.m.  Acid reflux -Continue Prilosec  Hemiparesis 2/2 stroke/ History of CVA  -Continue to use Rollator with ambulation and always assist patient out of bed to chair or with other activities out of bed  Chronic LBP -Previously on short term Vicodin but this was dc'd upon return to SNF and she is now on prn Tylenol  FTT in adult -Likely related to underlying dementia -Provide protein supplementation  Prolonged QT interval -Was not present on previous EKG and potentially rate related -Repeat EKG in a.m. -Avoid offending medications such as fluoroquinolones, Zofran and Haldol -Trazadone can cause QT prolongation- new prolongation since last admit And likley 2/2 this med so will decrease dose to 25 mg and follow QTc/should not abruptly dc Trazodone.    Code Status: DNR Family Communication: Plan discussed with the patient. Disposition Plan: Remains inpatient Diet: DIET DYS 3 Room service appropriate?: Yes; Fluid consistency:: Thin  Consultants:  Cardiology  Procedures:  None  Antibiotics:  None   Objective: Filed Vitals:   01/04/15 0436  BP: 127/53  Pulse: 96  Temp: 98.6 F (37 C)  Resp: 18    Intake/Output Summary (Last 24 hours) at 01/04/15 1217 Last data filed at 01/04/15 0600  Gross per 24 hour  Intake 608.25 ml  Output      0 ml  Net 608.25 ml   Filed Weights   01/03/15 2002 01/04/15 0500  Weight: 50.349  kg (111 lb) 50.576 kg (111 lb 8 oz)    Exam: General: Alert and awake, oriented x3, not in any acute distress. HEENT: anicteric sclera, pupils reactive to light and accommodation, EOMI CVS: S1-S2 clear, no murmur rubs or gallops Chest: clear to auscultation  bilaterally, no wheezing, rales or rhonchi Abdomen: soft nontender, nondistended, normal bowel sounds, no organomegaly Extremities: no cyanosis, clubbing or edema noted bilaterally Neuro: Cranial nerves II-XII intact, no focal neurological deficits  Data Reviewed: Basic Metabolic Panel:  Recent Labs Lab 01/03/15 1309 01/03/15 1315 01/04/15 0845  NA 140 139 140  K 4.1 4.1 4.4  CL 107 107 109  CO2  --  22 21*  GLUCOSE 111* 112* 116*  BUN 24* 23* 20  CREATININE 1.20* 1.22* 1.13*  CALCIUM  --  9.8 9.3   Liver Function Tests:  Recent Labs Lab 01/03/15 1315 01/04/15 0845  AST 58* 42*  ALT 33 26  ALKPHOS 62 56  BILITOT 1.0 1.1  PROT 6.5 5.7*  ALBUMIN 3.9 3.3*   No results for input(s): LIPASE, AMYLASE in the last 168 hours. No results for input(s): AMMONIA in the last 168 hours. CBC:  Recent Labs Lab 01/03/15 1309 01/03/15 1315 01/04/15 0845  WBC  --  10.6* 7.6  NEUTROABS  --  8.2*  --   HGB 12.2 11.5* 10.8*  HCT 36.0 34.2* 33.2*  MCV  --  93.7 95.1  PLT  --  243 229   Cardiac Enzymes:  Recent Labs Lab 01/03/15 1643 01/03/15 2030 01/04/15 0134 01/04/15 0845  CKTOTAL 1050*  --   --   --   TROPONINI 0.16* 0.18* 0.25* 0.15*   BNP (last 3 results) No results for input(s): BNP in the last 8760 hours.  ProBNP (last 3 results) No results for input(s): PROBNP in the last 8760 hours.  CBG: No results for input(s): GLUCAP in the last 168 hours.  Micro No results found for this or any previous visit (from the past 240 hour(s)).   Studies: Dg Ribs Bilateral W/chest  01/03/2015   CLINICAL DATA:  Status post fall from bed with left-sided back and thoracic spine pain.  EXAM: BILATERAL RIBS AND CHEST - 4+ VIEW  COMPARISON:  None.  FINDINGS: No fracture or other bone lesions are seen involving the ribs. There is no evidence of pneumothorax or pleural effusion. Both lungs are clear. Heart size and mediastinal contours are within normal limits. An electronic  apparatus overlies the left lower thorax.  IMPRESSION: No evidence of displaced rib fractures.   Electronically Signed   By: Ted Mcalpine M.D.   On: 01/03/2015 19:44   Dg Thoracic Spine W/swimmers  01/03/2015   CLINICAL DATA:  Back pain status post fall.  EXAM: THORACIC SPINE - 3 VIEWS  COMPARISON:  None.  FINDINGS: There is no evidence of thoracic spine fracture. There is thoracic spine kyphosis, likely secondary to osteoarthritic changes. Mild height loss of T11, T12, and L1 is noted, likely related to degenerative changes. No other significant bone abnormalities are identified. Electronic apparatus overlies the lower left thorax.  IMPRESSION: No evidence of displaced fracture of the thoracic spine.  Osteoarthritic changes, with likely degenerative height loss of T11, T12 and L1 vertebral bodies. Please correlate to patient's clinical exam.   Electronically Signed   By: Ted Mcalpine M.D.   On: 01/03/2015 14:41   Ct Head Wo Contrast  01/03/2015   CLINICAL DATA:  Patient fell. Found at the foot upper been. Confused with neck  pain.  EXAM: CT HEAD WITHOUT CONTRAST  CT CERVICAL SPINE WITHOUT CONTRAST  TECHNIQUE: Multidetector CT imaging of the head and cervical spine was performed following the standard protocol without intravenous contrast. Multiplanar CT image reconstructions of the cervical spine were also generated.  COMPARISON:  04/24/2014  FINDINGS: CT HEAD FINDINGS  Ventricles are enlarged, to a greater degree than the sulci, but are stable from the prior CT. Findings are consistent with advanced atrophy.  There are no parenchymal masses or mass effect.  There are multiple old infarcts involving both frontal lobes and the right external capsule on the left thalamus. White matter hypoattenuation is seen more diffusely consistent with moderate to advanced chronic microvascular ischemic change.  There is no evidence of a recent cortical infarct.  There are no extra-axial masses or abnormal fluid  collections.  There is no intracranial hemorrhage.  No skull fracture. Moderate mucosal thickening lines the ethmoid air cells. There is mild maxillary sinus mucosal thickening. Mucous retention cyst lies in the right sphenoid sinus. Clear mastoid air cells.  CT CERVICAL SPINE FINDINGS  No fracture. No spondylolisthesis. There is marked loss of disc height at C5-C6. Moderate loss disc height is noted at C6-C7. There is mild to moderate loss of disc height at C2-C3 and C3-C4. Facet degenerative changes noted bilaterally. There is acquired fusion of the right facets at C2-C3 and C3-C4. Bones are diffusely demineralized. There multiple levels of neural foraminal narrowing. Soft tissues are unremarkable. Scarring is noted at the lung apices.  IMPRESSION: HEAD CT: No acute intracranial abnormalities. Advanced atrophy, chronic microvascular ischemic change and multiple old infarcts.  CERVICAL CT: No fracture or acute finding. Advanced degenerative changes.   Electronically Signed   By: Amie Portland M.D.   On: 01/03/2015 15:05   Ct Cervical Spine Wo Contrast  01/03/2015   CLINICAL DATA:  Patient fell. Found at the foot upper been. Confused with neck pain.  EXAM: CT HEAD WITHOUT CONTRAST  CT CERVICAL SPINE WITHOUT CONTRAST  TECHNIQUE: Multidetector CT imaging of the head and cervical spine was performed following the standard protocol without intravenous contrast. Multiplanar CT image reconstructions of the cervical spine were also generated.  COMPARISON:  04/24/2014  FINDINGS: CT HEAD FINDINGS  Ventricles are enlarged, to a greater degree than the sulci, but are stable from the prior CT. Findings are consistent with advanced atrophy.  There are no parenchymal masses or mass effect.  There are multiple old infarcts involving both frontal lobes and the right external capsule on the left thalamus. White matter hypoattenuation is seen more diffusely consistent with moderate to advanced chronic microvascular ischemic  change.  There is no evidence of a recent cortical infarct.  There are no extra-axial masses or abnormal fluid collections.  There is no intracranial hemorrhage.  No skull fracture. Moderate mucosal thickening lines the ethmoid air cells. There is mild maxillary sinus mucosal thickening. Mucous retention cyst lies in the right sphenoid sinus. Clear mastoid air cells.  CT CERVICAL SPINE FINDINGS  No fracture. No spondylolisthesis. There is marked loss of disc height at C5-C6. Moderate loss disc height is noted at C6-C7. There is mild to moderate loss of disc height at C2-C3 and C3-C4. Facet degenerative changes noted bilaterally. There is acquired fusion of the right facets at C2-C3 and C3-C4. Bones are diffusely demineralized. There multiple levels of neural foraminal narrowing. Soft tissues are unremarkable. Scarring is noted at the lung apices.  IMPRESSION: HEAD CT: No acute intracranial abnormalities. Advanced atrophy, chronic microvascular  ischemic change and multiple old infarcts.  CERVICAL CT: No fracture or acute finding. Advanced degenerative changes.   Electronically Signed   By: Amie Portland M.D.   On: 01/03/2015 15:05   Mr Brain Wo Contrast  01/03/2015   CLINICAL DATA:  Dementia with combative behavior. Fell earlier today. Hallucinating. Agitation.  EXAM: MRI HEAD WITHOUT CONTRAST  TECHNIQUE: Multiplanar, multiecho pulse sequences of the brain and surrounding structures were obtained without intravenous contrast.  COMPARISON:  CT earlier same day  FINDINGS: The study suffers from motion degradation. The patient would not allow scanning beyond the diffusion imaging and 8 markedly motion degraded T2 exam.  There is no evidence of acute infarction, mass lesion, hemorrhage, obstructive hydrocephalus or extra-axial collection. There is an old left thalamic infarction. There are chronic small-vessel ischemic changes throughout the white matter. Enlargement of the ventricles is presumed secondary to central  atrophy. There are old bifrontal cortical and subcortical infarctions as seen previously.  IMPRESSION: Markedly motion degraded and foreshortened exam because of the patient's condition. No sign of acute infarction. Atrophy and chronic small vessel ischemic changes. Old bifrontal cortical and subcortical infarctions.   Electronically Signed   By: Paulina Fusi M.D.   On: 01/03/2015 18:45   Dg Knee Complete 4 Views Right  01/03/2015   CLINICAL DATA:  Fall, acute anterior right knee pain  EXAM: RIGHT KNEE - COMPLETE 4+ VIEW  COMPARISON:  None.  FINDINGS: Bones are osteopenic. Moderate tricompartmental osteoarthritis with joint space loss, sclerosis and bony spurring. Atrophy of the extremity musculature. Normal alignment without acute fracture or effusion.  IMPRESSION: Tricompartmental osteoarthritis.  Osteopenia.  No acute finding by plain radiography.   Electronically Signed   By: Judie Petit.  Shick M.D.   On: 01/03/2015 14:38    Scheduled Meds: . aspirin EC  81 mg Oral Daily  . enoxaparin (LOVENOX) injection  40 mg Subcutaneous Q24H  . feeding supplement  1 Container Oral TID BM  . feeding supplement (ENSURE ENLIVE)  237 mL Oral BID BM  . loratadine  10 mg Oral Daily  . omega-3 acid ethyl esters  1 g Oral BID  . pantoprazole  40 mg Oral Daily  . polyethylene glycol  17 g Oral QODAY  . sodium chloride  3 mL Intravenous Q12H  . traZODone  25 mg Oral QHS   Continuous Infusions: . sodium chloride 75 mL/hr at 01/04/15 0227       Time spent: 35 minutes    [facility] A  [facility] Pager [phone] If 7PM-7AM, please contact night-coverage at [email or web address], password [facility] 01/04/2015, 12:17 PM

## 2015-01-04 NOTE — Progress Notes (Signed)
Pt troponin series showed elevation, Claiborne Billings PA made aware. Pt has no complaints at this time, in no acute distress. We will continue to monitor.

## 2015-01-05 ENCOUNTER — Inpatient Hospital Stay (HOSPITAL_COMMUNITY): Payer: Medicare Other

## 2015-01-05 DIAGNOSIS — R001 Bradycardia, unspecified: Secondary | ICD-10-CM

## 2015-01-05 DIAGNOSIS — R627 Adult failure to thrive: Secondary | ICD-10-CM

## 2015-01-05 LAB — URINE CULTURE

## 2015-01-05 MED ORDER — ENSURE ENLIVE PO LIQD: 237.0000 mL | Freq: Two times a day (BID) | ORAL | Status: DC

## 2015-01-05 MED ORDER — BOOST / RESOURCE BREEZE PO LIQD: 1.0000 | ORAL | Status: DC

## 2015-01-05 NOTE — Progress Notes (Signed)
RN paged because pt is insisting that IVF be stopped even though she was educated about why IVF were ordered. Will recheck BMP in am to monitor creat.

## 2015-01-05 NOTE — Progress Notes (Signed)
[facility] PROGRESS NOTE   Lyndia Bury BJY:[id number] DOB: [birth year] DOA: 01/03/2015 PCP: Darrow Bussing, MD  HPI/Subjective: Patient is disoriented but fully awake and alert, complains about back pain. We'll obtain plain x-rays of lumbar spine  Assessment/Plan: Principal Problem:   Altered mental status Active Problems:   Dementia with Lewy bodies   HLD (hyperlipidemia)   Hemiparesis and alteration of sensations as late effects of stroke   FTT (failure to thrive) in adult   History of CVA (cerebrovascular accident)   Dehydration   Acute kidney injury   Elevated transaminase level   Elevated troponin   Acid reflux   CKD (chronic kidney disease), stage III   Prolonged QT interval    Altered mental status -Patient presented to the hospital with altered mental status, MRI was done showed no acute events. -Urinalysis was done as well as chest x-ray no evidence of infection, no fever no leukocytosis. -Cannot rule out medications as cause of her altered mental status including Namenda and Desyrel. -Continues to be awake and alert, complains about back pain.  Elevated troponin -Troponin is 0.16 with peak of 0.25, patient is DNR and has advanced dementia, likely to treat medically. -EKG does not show any evidence of ischemia. -Was not started on beta blockers because of history of pauses/bradycardia. -Cardiology evaluating, loop recorder to be interrogated  Dehydration/Acute kidney injury/CKD, stage III -Mild in nature and patient with documented poor intake in relation to her underlying dementia and known failure to thrive -Gentle IV fluid hydration -Creatinine at baseline at 1.13.  History bradycardia and sinus pauses -Loop recorder fully interrogated by cardiology. -Cardiology to evaluate.  Dementia with Lewy bodies -Namenda on hold secondary to concerns contribute to patient's acute altered mentation -Patient has tolerated nocturnal trazodone.  HLD  -Based on  last admission patient was taking omega-3 fatty acids which I have resumed  Elevated transaminase level -Likely related to dehydration -Repeat in a.m.  Acid reflux -Continue Prilosec  Hemiparesis 2/2 stroke/ History of CVA  -Continue to use Rollator with ambulation and always assist patient out of bed to chair or with other activities out of bed  Chronic LBP -Previously on short term Vicodin but this was dc'd upon return to SNF and she is now on prn Tylenol  FTT in adult -Likely related to underlying dementia -Provide protein supplementation  Prolonged QT interval -Was not present on previous EKG and potentially rate related -Repeat EKG in a.m. -Avoid offending medications such as fluoroquinolones, Zofran and Haldol -Trazadone can cause QT prolongation- new prolongation since last admit And likley 2/2 this med so will decrease dose to 25 mg and follow QTc/should not abruptly dc Trazodone.    Code Status: DNR Family Communication: Plan discussed with the patient. Disposition Plan: Remains inpatient Diet: DIET DYS 3 Room service appropriate?: Yes; Fluid consistency:: Thin  Consultants:  Cardiology  Procedures:  None  Antibiotics:  None   Objective: Filed Vitals:   01/05/15 0941  BP: 127/47  Pulse: 78  Temp:   Resp:     Intake/Output Summary (Last 24 hours) at 01/05/15 1120 Last data filed at 01/05/15 0953  Gross per 24 hour  Intake   3114 ml  Output    850 ml  Net   2264 ml   Filed Weights   01/03/15 2002 01/04/15 0500 01/05/15 0354  Weight: 50.349 kg (111 lb) 50.576 kg (111 lb 8 oz) 52.799 kg (116 lb 6.4 oz)    Exam: General: Alert and awake, oriented x3, not in  any acute distress. HEENT: anicteric sclera, pupils reactive to light and accommodation, EOMI CVS: S1-S2 clear, no murmur rubs or gallops Chest: clear to auscultation bilaterally, no wheezing, rales or rhonchi Abdomen: soft nontender, nondistended, normal bowel sounds, no  organomegaly Extremities: no cyanosis, clubbing or edema noted bilaterally Neuro: Cranial nerves II-XII intact, no focal neurological deficits  Data Reviewed: Basic Metabolic Panel:  Recent Labs Lab 01/03/15 1309 01/03/15 1315 01/04/15 0845  NA 140 139 140  K 4.1 4.1 4.4  CL 107 107 109  CO2  --  22 21*  GLUCOSE 111* 112* 116*  BUN 24* 23* 20  CREATININE 1.20* 1.22* 1.13*  CALCIUM  --  9.8 9.3   Liver Function Tests:  Recent Labs Lab 01/03/15 1315 01/04/15 0845  AST 58* 42*  ALT 33 26  ALKPHOS 62 56  BILITOT 1.0 1.1  PROT 6.5 5.7*  ALBUMIN 3.9 3.3*   No results for input(s): LIPASE, AMYLASE in the last 168 hours. No results for input(s): AMMONIA in the last 168 hours. CBC:  Recent Labs Lab 01/03/15 1309 01/03/15 1315 01/04/15 0845  WBC  --  10.6* 7.6  NEUTROABS  --  8.2*  --   HGB 12.2 11.5* 10.8*  HCT 36.0 34.2* 33.2*  MCV  --  93.7 95.1  PLT  --  243 229   Cardiac Enzymes:  Recent Labs Lab 01/03/15 1643 01/03/15 2030 01/04/15 0134 01/04/15 0845  CKTOTAL 1050*  --   --   --   TROPONINI 0.16* 0.18* 0.25* 0.15*   BNP (last 3 results) No results for input(s): BNP in the last 8760 hours.  ProBNP (last 3 results) No results for input(s): PROBNP in the last 8760 hours.  CBG: No results for input(s): GLUCAP in the last 168 hours.  Micro Recent Results (from the past 240 hour(s))  Urine culture     Status: None   Collection Time: 01/03/15  1:16 PM  Result Value Ref Range Status   Specimen Description URINE, CLEAN CATCH  Final   Special Requests NONE  Final   Culture 80,000 COLONIES/ml ENTEROCOCCUS SPECIES  Final   Report Status 01/05/2015 FINAL  Final   Organism ID, Bacteria ENTEROCOCCUS SPECIES  Final      Susceptibility   Enterococcus species - MIC*    AMPICILLIN <=2 SENSITIVE Sensitive     LEVOFLOXACIN 1 SENSITIVE Sensitive     NITROFURANTOIN <=16 SENSITIVE Sensitive     VANCOMYCIN <=0.5 SENSITIVE Sensitive     * 80,000 COLONIES/ml  ENTEROCOCCUS SPECIES     Studies: Dg Ribs Bilateral W/chest  01/03/2015   CLINICAL DATA:  Status post fall from bed with left-sided back and thoracic spine pain.  EXAM: BILATERAL RIBS AND CHEST - 4+ VIEW  COMPARISON:  None.  FINDINGS: No fracture or other bone lesions are seen involving the ribs. There is no evidence of pneumothorax or pleural effusion. Both lungs are clear. Heart size and mediastinal contours are within normal limits. An electronic apparatus overlies the left lower thorax.  IMPRESSION: No evidence of displaced rib fractures.   Electronically Signed   By: Ted Mcalpine M.D.   On: 01/03/2015 19:44   Dg Thoracic Spine W/swimmers  01/03/2015   CLINICAL DATA:  Back pain status post fall.  EXAM: THORACIC SPINE - 3 VIEWS  COMPARISON:  None.  FINDINGS: There is no evidence of thoracic spine fracture. There is thoracic spine kyphosis, likely secondary to osteoarthritic changes. Mild height loss of T11, T12, and L1 is noted,  likely related to degenerative changes. No other significant bone abnormalities are identified. Electronic apparatus overlies the lower left thorax.  IMPRESSION: No evidence of displaced fracture of the thoracic spine.  Osteoarthritic changes, with likely degenerative height loss of T11, T12 and L1 vertebral bodies. Please correlate to patient's clinical exam.   Electronically Signed   By: Ted Mcalpine M.D.   On: 01/03/2015 14:41   Ct Head Wo Contrast  01/03/2015   CLINICAL DATA:  Patient fell. Found at the foot upper been. Confused with neck pain.  EXAM: CT HEAD WITHOUT CONTRAST  CT CERVICAL SPINE WITHOUT CONTRAST  TECHNIQUE: Multidetector CT imaging of the head and cervical spine was performed following the standard protocol without intravenous contrast. Multiplanar CT image reconstructions of the cervical spine were also generated.  COMPARISON:  04/24/2014  FINDINGS: CT HEAD FINDINGS  Ventricles are enlarged, to a greater degree than the sulci, but are stable  from the prior CT. Findings are consistent with advanced atrophy.  There are no parenchymal masses or mass effect.  There are multiple old infarcts involving both frontal lobes and the right external capsule on the left thalamus. White matter hypoattenuation is seen more diffusely consistent with moderate to advanced chronic microvascular ischemic change.  There is no evidence of a recent cortical infarct.  There are no extra-axial masses or abnormal fluid collections.  There is no intracranial hemorrhage.  No skull fracture. Moderate mucosal thickening lines the ethmoid air cells. There is mild maxillary sinus mucosal thickening. Mucous retention cyst lies in the right sphenoid sinus. Clear mastoid air cells.  CT CERVICAL SPINE FINDINGS  No fracture. No spondylolisthesis. There is marked loss of disc height at C5-C6. Moderate loss disc height is noted at C6-C7. There is mild to moderate loss of disc height at C2-C3 and C3-C4. Facet degenerative changes noted bilaterally. There is acquired fusion of the right facets at C2-C3 and C3-C4. Bones are diffusely demineralized. There multiple levels of neural foraminal narrowing. Soft tissues are unremarkable. Scarring is noted at the lung apices.  IMPRESSION: HEAD CT: No acute intracranial abnormalities. Advanced atrophy, chronic microvascular ischemic change and multiple old infarcts.  CERVICAL CT: No fracture or acute finding. Advanced degenerative changes.   Electronically Signed   By: Amie Portland M.D.   On: 01/03/2015 15:05   Ct Cervical Spine Wo Contrast  01/03/2015   CLINICAL DATA:  Patient fell. Found at the foot upper been. Confused with neck pain.  EXAM: CT HEAD WITHOUT CONTRAST  CT CERVICAL SPINE WITHOUT CONTRAST  TECHNIQUE: Multidetector CT imaging of the head and cervical spine was performed following the standard protocol without intravenous contrast. Multiplanar CT image reconstructions of the cervical spine were also generated.  COMPARISON:  04/24/2014   FINDINGS: CT HEAD FINDINGS  Ventricles are enlarged, to a greater degree than the sulci, but are stable from the prior CT. Findings are consistent with advanced atrophy.  There are no parenchymal masses or mass effect.  There are multiple old infarcts involving both frontal lobes and the right external capsule on the left thalamus. White matter hypoattenuation is seen more diffusely consistent with moderate to advanced chronic microvascular ischemic change.  There is no evidence of a recent cortical infarct.  There are no extra-axial masses or abnormal fluid collections.  There is no intracranial hemorrhage.  No skull fracture. Moderate mucosal thickening lines the ethmoid air cells. There is mild maxillary sinus mucosal thickening. Mucous retention cyst lies in the right sphenoid sinus. Clear mastoid air cells.  CT CERVICAL SPINE FINDINGS  No fracture. No spondylolisthesis. There is marked loss of disc height at C5-C6. Moderate loss disc height is noted at C6-C7. There is mild to moderate loss of disc height at C2-C3 and C3-C4. Facet degenerative changes noted bilaterally. There is acquired fusion of the right facets at C2-C3 and C3-C4. Bones are diffusely demineralized. There multiple levels of neural foraminal narrowing. Soft tissues are unremarkable. Scarring is noted at the lung apices.  IMPRESSION: HEAD CT: No acute intracranial abnormalities. Advanced atrophy, chronic microvascular ischemic change and multiple old infarcts.  CERVICAL CT: No fracture or acute finding. Advanced degenerative changes.   Electronically Signed   By: Amie Portland M.D.   On: 01/03/2015 15:05   Mr Brain Wo Contrast  01/03/2015   CLINICAL DATA:  Dementia with combative behavior. Fell earlier today. Hallucinating. Agitation.  EXAM: MRI HEAD WITHOUT CONTRAST  TECHNIQUE: Multiplanar, multiecho pulse sequences of the brain and surrounding structures were obtained without intravenous contrast.  COMPARISON:  CT earlier same day   FINDINGS: The study suffers from motion degradation. The patient would not allow scanning beyond the diffusion imaging and 8 markedly motion degraded T2 exam.  There is no evidence of acute infarction, mass lesion, hemorrhage, obstructive hydrocephalus or extra-axial collection. There is an old left thalamic infarction. There are chronic small-vessel ischemic changes throughout the white matter. Enlargement of the ventricles is presumed secondary to central atrophy. There are old bifrontal cortical and subcortical infarctions as seen previously.  IMPRESSION: Markedly motion degraded and foreshortened exam because of the patient's condition. No sign of acute infarction. Atrophy and chronic small vessel ischemic changes. Old bifrontal cortical and subcortical infarctions.   Electronically Signed   By: Paulina Fusi M.D.   On: 01/03/2015 18:45   Dg Knee Complete 4 Views Right  01/03/2015   CLINICAL DATA:  Fall, acute anterior right knee pain  EXAM: RIGHT KNEE - COMPLETE 4+ VIEW  COMPARISON:  None.  FINDINGS: Bones are osteopenic. Moderate tricompartmental osteoarthritis with joint space loss, sclerosis and bony spurring. Atrophy of the extremity musculature. Normal alignment without acute fracture or effusion.  IMPRESSION: Tricompartmental osteoarthritis.  Osteopenia.  No acute finding by plain radiography.   Electronically Signed   By: Judie Petit.  Shick M.D.   On: 01/03/2015 14:38    Scheduled Meds: . aspirin EC  81 mg Oral Daily  . enoxaparin (LOVENOX) injection  40 mg Subcutaneous Q24H  . feeding supplement  1 Container Oral TID BM  . feeding supplement (ENSURE ENLIVE)  237 mL Oral BID BM  . loratadine  10 mg Oral Daily  . omega-3 acid ethyl esters  1 g Oral BID  . pantoprazole  40 mg Oral Daily  . polyethylene glycol  17 g Oral QODAY  . sodium chloride  3 mL Intravenous Q12H  . traZODone  25 mg Oral QHS   Continuous Infusions: . sodium chloride 75 mL/hr at 01/05/15 0925       Time spent: 35  minutes    [facility]  [facility] Pager [phone] If 7PM-7AM, please contact night-coverage at [email or web address], password [facility] 01/05/2015, 11:20 AM  LOS: 1 day

## 2015-01-05 NOTE — Evaluation (Signed)
Physical Therapy Evaluation Patient Details Name: Tracey [name] MRN: [id number] DOB: 11-24-1930 Today's Date: 01/05/2015   History of Present Illness  81 year old female patient with history of dementia with bili bodies, prior stroke with residual left hemiparesis and gait imbalance, anemia of chronic kidney disease, chronic kidney disease stage III, known failure to thrive, dyslipidemia. She was recently discharged on 8/27 after being treated for urinary tract infection. She had recently followed up on 9/13 with her neurologist and Namenda was initiated for her underlying dementia symptoms. History of low back pain and has recently followed up with her orthopedic physician. Patient was sent to this hospital today because of altered mentation involving slurred/garbled speech without other focal neurological deficits and hallucinatory behaviors after a fall.   Clinical Impression  Patient in bed, agreeable to participate in PT today. Patient presents with deficits listed below. Patient was able to ambulate and transfer as described below. Patient will benefit from continued PT to return patient to previously modified independent lifestyle and to decrease her burden of care at the ALF.    Follow Up Recommendations Home health PT;Supervision for mobility/OOB    Equipment Recommendations  None recommended by PT    Recommendations for Other Services       Precautions / Restrictions Precautions Precautions: Fall Restrictions Weight Bearing Restrictions: No      Mobility  Bed Mobility Overal bed mobility: Modified Independent             General bed mobility comments: Patient able to get to sitting EOB without physical assistance. Use of bed rails.  Transfers Overall transfer level: Modified independent Equipment used: Rolling walker (2 wheeled) Transfers: Sit to/from Stand Sit to Stand: Supervision         General transfer comment: No physical assistance needed, patient steady on  feet with use of RW.  Ambulation/Gait Ambulation/Gait assistance: Min guard Ambulation Distance (Feet): 160 Feet Assistive device: Rolling walker (2 wheeled) Gait Pattern/deviations: Shuffle;Decreased stride length;Step-through pattern;Trunk flexed;Narrow base of support   Gait velocity interpretation: Below normal speed for age/gender General Gait Details: Patient regularly complaining about difficulty of using RW due to being used to Rollator. Foot clearance is minimal for every step, patient varies pace seemingly at random. Looks at ground throughout, stops to look up to interact with environment.  Stairs            Wheelchair Mobility    Modified Rankin (Stroke Patients Only)       Balance Overall balance assessment: Needs assistance Sitting-balance support: Feet supported;No upper extremity supported Sitting balance-Leahy Scale: Good     Standing balance support: Bilateral upper extremity supported Standing balance-Leahy Scale: Fair                               Pertinent Vitals/Pain Pain Assessment: 0-10 Pain Score: 8  Pain Location: Low back Pain Descriptors / Indicators: Aching;Constant;Tender Pain Intervention(s): Limited activity within patient's tolerance;Monitored during session;Patient requesting pain meds-RN notified    Home Living Family/patient expects to be discharged to:: Assisted living     Type of Home: Assisted living         Home Equipment: Walker - 4 wheels;Grab bars - toilet;Tub bench Additional Comments: [facility]    Prior Function Level of Independence: Needs assistance   Gait / Transfers Assistance Needed: Ambulated with Rollator per patient report. Says she only needed intermittent help but may need more at this point. Feels very weak now.  Hand Dominance   Dominant Hand: Right    Extremity/Trunk Assessment   Upper Extremity Assessment: Overall WFL for tasks assessed           Lower  Extremity Assessment: Overall WFL for tasks assessed      Cervical / Trunk Assessment: Kyphotic  Communication   Communication: No difficulties  Cognition Arousal/Alertness: Awake/alert Behavior During Therapy: WFL for tasks assessed/performed Overall Cognitive Status: No family/caregiver present to determine baseline cognitive functioning                      General Comments      Exercises        Assessment/Plan    PT Assessment Patient needs continued PT services  PT Diagnosis Difficulty walking;Abnormality of gait;Generalized weakness   PT Problem List Decreased strength;Decreased activity tolerance;Decreased balance;Decreased mobility;Decreased knowledge of use of DME;Decreased safety awareness;Pain  PT Treatment Interventions DME instruction;Gait training;Functional mobility training;Therapeutic exercise;Therapeutic activities;Balance training;Patient/family education   PT Goals (Current goals can be found in the Care Plan section) Acute Rehab PT Goals Patient Stated Goal: Go home and get better. PT Goal Formulation: With patient Time For Goal Achievement: 01/19/15 Potential to Achieve Goals: Good    Frequency Min 3X/week   Barriers to discharge Decreased caregiver support      Co-evaluation               End of Session Equipment Utilized During Treatment: Gait belt Activity Tolerance: Patient tolerated treatment well;No increased pain Patient left: in chair;with call bell/phone within reach;with chair alarm set Nurse Communication: Mobility status;Other (comment) (Patient requesting pain meds.)         Time: 4540-9811 PT Time Calculation (min) (ACUTE ONLY): 20 min   Charges:   PT Evaluation $Initial PT Evaluation Tier I: 1 Procedure     PT G CodesMichele Rockers, SPT [phone] 01/05/2015, 4:04 PM  I have read, reviewed and agree with student's note.   [facility] Acute [facility] [phone]  (pager)

## 2015-01-05 NOTE — Progress Notes (Signed)
Patient Name: Tracey [name] Date of Encounter: 01/05/2015  Primary Physician: Dewayne Hatch Primary Cardiologist: Lewayne Bunting  Principal Problem:   Altered mental status Active Problems:   Dementia with Lewy bodies   HLD (hyperlipidemia)   Hemiparesis and alteration of sensations as late effects of stroke   FTT (failure to thrive) in adult   History of CVA (cerebrovascular accident)   Dehydration   Acute kidney injury   Elevated transaminase level   Elevated troponin   Acid reflux   CKD (chronic kidney disease), stage III   Prolonged QT interval  SUBJECTIVE  Awake and alert, however somewhat disoriented. No chest pain, sob or palpitation. Complains of back pain.   CURRENT MEDS . aspirin EC  81 mg Oral Daily  . enoxaparin (LOVENOX) injection  40 mg Subcutaneous Q24H  . feeding supplement  1 Container Oral TID BM  . feeding supplement (ENSURE ENLIVE)  237 mL Oral BID BM  . loratadine  10 mg Oral Daily  . omega-3 acid ethyl esters  1 g Oral BID  . pantoprazole  40 mg Oral Daily  . polyethylene glycol  17 g Oral QODAY  . sodium chloride  3 mL Intravenous Q12H  . traZODone  25 mg Oral QHS    OBJECTIVE  Filed Vitals:   01/04/15 2116 01/05/15 0354 01/05/15 0941 01/05/15 1125  BP: 115/50 107/42 127/47 118/56  Pulse: 70 67 78 73  Temp: 98.6 F (37 C) 98.8 F (37.1 C)  98.3 F (36.8 C)  TempSrc: Oral Oral  Oral  Resp: 18 18  20   Height:      Weight:  116 lb 6.4 oz (52.799 kg)    SpO2: 100% 100%  97%    Intake/Output Summary (Last 24 hours) at 01/05/15 1151 Last data filed at 01/05/15 1129  Gross per 24 hour  Intake   3114 ml  Output   1075 ml  Net   2039 ml   Filed Weights   01/03/15 2002 01/04/15 0500 01/05/15 0354  Weight: 111 lb (50.349 kg) 111 lb 8 oz (50.576 kg) 116 lb 6.4 oz (52.799 kg)    PHYSICAL EXAM  General: Pleasant, NAD. Neuro: Alert and oriented X 3. Moves all extremities spontaneously. Left sided weakness.  Psych: dementia HEENT:   Normal  Neck: Supple without bruits or JVD. Lungs:  Resp regular and unlabored, CTA. Heart: RRR no s3, s4, or murmurs. Abdomen: Soft, non-tender, non-distended, BS + x 4.  Extremities: No clubbing, cyanosis or edema. DP/PT/Radials 2+ and equal bilaterally.  Accessory Clinical Findings  CBC  Recent Labs  01/03/15 1315 01/04/15 0845  WBC 10.6* 7.6  NEUTROABS 8.2*  --   HGB 11.5* 10.8*  HCT 34.2* 33.2*  MCV 93.7 95.1  PLT 243 229   Basic Metabolic Panel  Recent Labs  01/03/15 1315 01/04/15 0845  NA 139 140  K 4.1 4.4  CL 107 109  CO2 22 21*  GLUCOSE 112* 116*  BUN 23* 20  CREATININE 1.22* 1.13*  CALCIUM 9.8 9.3   Liver Function Tests  Recent Labs  01/03/15 1315 01/04/15 0845  AST 58* 42*  ALT 33 26  ALKPHOS 62 56  BILITOT 1.0 1.1  PROT 6.5 5.7*  ALBUMIN 3.9 3.3*   No results for input(s): LIPASE, AMYLASE in the last 72 hours. Cardiac Enzymes  Recent Labs  01/03/15 1643 01/03/15 2030 01/04/15 0134 01/04/15 0845  CKTOTAL 1050*  --   --   --   TROPONINI 0.16* 0.18* 0.25*  0.15*   TELE  Sinus rhythm with 1st degree AV block  Radiology/Studies  Dg Ribs Bilateral W/chest  01/03/2015   CLINICAL DATA:  Status post fall from bed with left-sided back and thoracic spine pain.  EXAM: BILATERAL RIBS AND CHEST - 4+ VIEW  COMPARISON:  None.  FINDINGS: No fracture or other bone lesions are seen involving the ribs. There is no evidence of pneumothorax or pleural effusion. Both lungs are clear. Heart size and mediastinal contours are within normal limits. An electronic apparatus overlies the left lower thorax.  IMPRESSION: No evidence of displaced rib fractures.   Electronically Signed   By: Ted Mcalpine M.D.   On: 01/03/2015 19:44   Dg Thoracic Spine W/swimmers  01/03/2015   CLINICAL DATA:  Back pain status post fall.  EXAM: THORACIC SPINE - 3 VIEWS  COMPARISON:  None.  FINDINGS: There is no evidence of thoracic spine fracture. There is thoracic spine  kyphosis, likely secondary to osteoarthritic changes. Mild height loss of T11, T12, and L1 is noted, likely related to degenerative changes. No other significant bone abnormalities are identified. Electronic apparatus overlies the lower left thorax.  IMPRESSION: No evidence of displaced fracture of the thoracic spine.  Osteoarthritic changes, with likely degenerative height loss of T11, T12 and L1 vertebral bodies. Please correlate to patient's clinical exam.   Electronically Signed   By: Ted Mcalpine M.D.   On: 01/03/2015 14:41   Ct Head Wo Contrast  01/03/2015   CLINICAL DATA:  Patient fell. Found at the foot upper been. Confused with neck pain.  EXAM: CT HEAD WITHOUT CONTRAST  CT CERVICAL SPINE WITHOUT CONTRAST  TECHNIQUE: Multidetector CT imaging of the head and cervical spine was performed following the standard protocol without intravenous contrast. Multiplanar CT image reconstructions of the cervical spine were also generated.  COMPARISON:  04/24/2014  FINDINGS: CT HEAD FINDINGS  Ventricles are enlarged, to a greater degree than the sulci, but are stable from the prior CT. Findings are consistent with advanced atrophy.  There are no parenchymal masses or mass effect.  There are multiple old infarcts involving both frontal lobes and the right external capsule on the left thalamus. White matter hypoattenuation is seen more diffusely consistent with moderate to advanced chronic microvascular ischemic change.  There is no evidence of a recent cortical infarct.  There are no extra-axial masses or abnormal fluid collections.  There is no intracranial hemorrhage.  No skull fracture. Moderate mucosal thickening lines the ethmoid air cells. There is mild maxillary sinus mucosal thickening. Mucous retention cyst lies in the right sphenoid sinus. Clear mastoid air cells.  CT CERVICAL SPINE FINDINGS  No fracture. No spondylolisthesis. There is marked loss of disc height at C5-C6. Moderate loss disc height is  noted at C6-C7. There is mild to moderate loss of disc height at C2-C3 and C3-C4. Facet degenerative changes noted bilaterally. There is acquired fusion of the right facets at C2-C3 and C3-C4. Bones are diffusely demineralized. There multiple levels of neural foraminal narrowing. Soft tissues are unremarkable. Scarring is noted at the lung apices.  IMPRESSION: HEAD CT: No acute intracranial abnormalities. Advanced atrophy, chronic microvascular ischemic change and multiple old infarcts.  CERVICAL CT: No fracture or acute finding. Advanced degenerative changes.   Electronically Signed   By: Amie Portland M.D.   On: 01/03/2015 15:05   Ct Cervical Spine Wo Contrast  01/03/2015   CLINICAL DATA:  Patient fell. Found at the foot upper been. Confused with neck pain.  EXAM: CT HEAD WITHOUT CONTRAST  CT CERVICAL SPINE WITHOUT CONTRAST  TECHNIQUE: Multidetector CT imaging of the head and cervical spine was performed following the standard protocol without intravenous contrast. Multiplanar CT image reconstructions of the cervical spine were also generated.  COMPARISON:  04/24/2014  FINDINGS: CT HEAD FINDINGS  Ventricles are enlarged, to a greater degree than the sulci, but are stable from the prior CT. Findings are consistent with advanced atrophy.  There are no parenchymal masses or mass effect.  There are multiple old infarcts involving both frontal lobes and the right external capsule on the left thalamus. White matter hypoattenuation is seen more diffusely consistent with moderate to advanced chronic microvascular ischemic change.  There is no evidence of a recent cortical infarct.  There are no extra-axial masses or abnormal fluid collections.  There is no intracranial hemorrhage.  No skull fracture. Moderate mucosal thickening lines the ethmoid air cells. There is mild maxillary sinus mucosal thickening. Mucous retention cyst lies in the right sphenoid sinus. Clear mastoid air cells.  CT CERVICAL SPINE FINDINGS  No  fracture. No spondylolisthesis. There is marked loss of disc height at C5-C6. Moderate loss disc height is noted at C6-C7. There is mild to moderate loss of disc height at C2-C3 and C3-C4. Facet degenerative changes noted bilaterally. There is acquired fusion of the right facets at C2-C3 and C3-C4. Bones are diffusely demineralized. There multiple levels of neural foraminal narrowing. Soft tissues are unremarkable. Scarring is noted at the lung apices.  IMPRESSION: HEAD CT: No acute intracranial abnormalities. Advanced atrophy, chronic microvascular ischemic change and multiple old infarcts.  CERVICAL CT: No fracture or acute finding. Advanced degenerative changes.   Electronically Signed   By: Amie Portland M.D.   On: 01/03/2015 15:05   Mr Brain Wo Contrast  01/03/2015   CLINICAL DATA:  Dementia with combative behavior. Fell earlier today. Hallucinating. Agitation.  EXAM: MRI HEAD WITHOUT CONTRAST  TECHNIQUE: Multiplanar, multiecho pulse sequences of the brain and surrounding structures were obtained without intravenous contrast.  COMPARISON:  CT earlier same day  FINDINGS: The study suffers from motion degradation. The patient would not allow scanning beyond the diffusion imaging and 8 markedly motion degraded T2 exam.  There is no evidence of acute infarction, mass lesion, hemorrhage, obstructive hydrocephalus or extra-axial collection. There is an old left thalamic infarction. There are chronic small-vessel ischemic changes throughout the white matter. Enlargement of the ventricles is presumed secondary to central atrophy. There are old bifrontal cortical and subcortical infarctions as seen previously.  IMPRESSION: Markedly motion degraded and foreshortened exam because of the patient's condition. No sign of acute infarction. Atrophy and chronic small vessel ischemic changes. Old bifrontal cortical and subcortical infarctions.   Electronically Signed   By: Paulina Fusi M.D.   On: 01/03/2015 18:45   Dg Knee  Complete 4 Views Right  01/03/2015   CLINICAL DATA:  Fall, acute anterior right knee pain  EXAM: RIGHT KNEE - COMPLETE 4+ VIEW  COMPARISON:  None.  FINDINGS: Bones are osteopenic. Moderate tricompartmental osteoarthritis with joint space loss, sclerosis and bony spurring. Atrophy of the extremity musculature. Normal alignment without acute fracture or effusion.  IMPRESSION: Tricompartmental osteoarthritis.  Osteopenia.  No acute finding by plain radiography.   Electronically Signed   By: Judie Petit.  Shick M.D.   On: 01/03/2015 14:38    ASSESSMENT AND PLAN   ASSESSMENT: 1. Elevated troponin of uncertain significance, doubt ACS - trop trend 0.16->0.18->0.25->0.15. - No chest pain. No ischemic eval that  this time.   2. Bradycardia with atrial bigeminy and probable sick sinus syndrome.  - The [employer] implantable loop recorder, serial number [id number]. Plan was to interrogate device. Will get it done today. No arrhythmia on tele.   3. Alzheimer's disease - per primary 4. Prior history of stroke with residual left hemiparesis. - MRI without acute abnormality.    Signed, Bhagat,Bhavinkumar PA-C   I have personally seen and examined this patient with Manson Passey, PA_C. I agree with the assessment and plan as outlined above. The troponin elevation is minimal and now trending down. I would not recommend any further ischemic evaluation in this pleasantly demented patient. His implantable cardiac monitor shows no events. Telemetry with sinus brady with 1st degree AV block and PACs. I would not recommend further cardiac workup. She is asking to go home. She can follow up with Dr. Ladona Ridgel in our office as planned after discharge. We will sign off. Please call with questions.   MCALHANY,CHRISTOPHER 01/05/2015 12:34 PM

## 2015-01-05 NOTE — Progress Notes (Signed)
Pt insisted to have iv taken off. Explained and educated pt re ivf still wants to have iv off. Informed triad MD. ivf taken off as per pts request

## 2015-01-05 NOTE — Progress Notes (Signed)
Initial Nutrition Assessment  INTERVENTION:  Provide Ensure Enlive po BID after meals, each supplement provides 350 kcal and 20 grams of protein Provide Boost Breeze po once daily, each supplement provides 250 kcal and 9 grams of protein   NUTRITION DIAGNOSIS:   Predicted suboptimal nutrient intake related to lethargy/confusion as evidenced by meal completion < 50%.   GOAL:   Patient will meet greater than or equal to 90% of their needs   MONITOR:   PO intake, Labs, Weight trends, Skin, I & O's  REASON FOR ASSESSMENT:   Malnutrition Screening Tool    ASSESSMENT:   81 year old lady with h/o dementia was on aricept which was discontinued for bradycardia, newly started on namenda and trazodone , hypertension, h/o CVA, is a nursing home resident was brought in for a fall earlier this am, and was found to be hallucinating and agitated.  Pt working with physical therapy at time of visit. Per paper chart, patient was drinking Boost High Protein twice daily (9am and 9pm) PTA. Weight history shows that patient's weight has been stable with a 2 lb weight gain in the past 3 weeks. Per nursing notes, pt is eating 25-45% of meals and is drinking Ensure Enlive and Boost Breeze supplements.  Labs: low hemoglobin  Diet Order:  DIET DYS 3 Room service appropriate?: Yes; Fluid consistency:: Thin  Skin:  Reviewed, no issues  Last BM:  9/18  Height:   Ht Readings from Last 1 Encounters:  01/03/15  (1.6 m)    Weight:   Wt Readings from Last 1 Encounters:  01/05/15 116 lb 6.4 oz (52.799 kg)    Ideal Body Weight:  52.3 kg  BMI:  Body mass index is 20.62 kg/(m^2).  Estimated Nutritional Needs:   Kcal:  1400-1600  Protein:  65-75 grams  Fluid:  1.4-1.6 L/day  EDUCATION NEEDS:   No education needs identified at this time  Dorothea Ogle RD, LDN Inpatient Clinical Dietitian Pager: [phone] After Hours Pager: [phone]

## 2015-01-06 ENCOUNTER — Encounter: Payer: Self-pay | Admitting: Internal Medicine

## 2015-01-06 ENCOUNTER — Other Ambulatory Visit: Payer: Self-pay | Admitting: Physician Assistant

## 2015-01-06 ENCOUNTER — Ambulatory Visit (INDEPENDENT_AMBULATORY_CARE_PROVIDER_SITE_OTHER): Payer: Medicare Other | Admitting: *Deleted

## 2015-01-06 DIAGNOSIS — M5136 Other intervertebral disc degeneration, lumbar region: Secondary | ICD-10-CM

## 2015-01-06 DIAGNOSIS — I6340 Cerebral infarction due to embolism of unspecified cerebral artery: Secondary | ICD-10-CM

## 2015-01-06 LAB — BASIC METABOLIC PANEL
Anion gap: 7 (ref 5–15)
BUN: 14 mg/dL (ref 6–20)
CALCIUM: 9.1 mg/dL (ref 8.9–10.3)
CO2: 23 mmol/L (ref 22–32)
CREATININE: 0.96 mg/dL (ref 0.44–1.00)
Chloride: 109 mmol/L (ref 101–111)
GFR calc non Af Amer: 54 mL/min — ABNORMAL LOW (ref >60)
Glucose, Bld: 117 mg/dL — ABNORMAL HIGH (ref 65–99)
Potassium: 3.8 mmol/L (ref 3.5–5.1)
SODIUM: 139 mmol/L (ref 135–145)

## 2015-01-06 NOTE — Progress Notes (Signed)
Pt refusing to have iv and ivf restarted

## 2015-01-06 NOTE — Discharge Summary (Signed)
Physician Discharge Summary  Tracey [name] ZOX:[id number] DOB: 1931/01/19 DOA: 01/03/2015  PCP: Darrow Bussing, MD  Admit date: 01/03/2015 Discharge date: 01/06/2015  Time spent: 40 minutes  Recommendations for Outpatient Follow-up:  1. Follow-up with primary care physician within one week. 2. Follow-up with cardiology as scheduled. 3. Discontinue Namenda as might be causing the altered mental status.  Discharge Diagnoses:  Principal Problem:   Altered mental status Active Problems:   Dementia with Lewy bodies   HLD (hyperlipidemia)   Hemiparesis and alteration of sensations as late effects of stroke   FTT (failure to thrive) in adult   History of CVA (cerebrovascular accident)   Dehydration   Acute kidney injury   Elevated transaminase level   Elevated troponin   Acid reflux   CKD (chronic kidney disease), stage III   Prolonged QT interval   Bradycardia, sinus   Discharge Condition: Stable  Diet recommendation: Soft mechanical diet  Filed Weights   01/04/15 0500 01/05/15 0354 01/06/15 0500  Weight: 50.576 kg (111 lb 8 oz) 52.799 kg (116 lb 6.4 oz) 51.483 kg (113 lb 8 oz)    History of present illness:  This is an 81-year-old female patient with history of dementia with bili bodies, prior stroke with residual left hemiparesis and gait imbalance, anemia of chronic kidney disease, chronic kidney disease stage III, known failure to thrive, dyslipidemia. She was recently discharged on 8/27 after being treated for urinary tract infection. Also had episodic bradycardia with sinus pulses attributed to her Aricept so this was discontinued. A loop recorder was placed by Dr. Ladona Ridgel. She had recently followed up on 9/13 with her neurologist and Namenda was initiated for her underlying dementia symptoms. She also has a history of low back pain and has recently followed up with her orthopedic physician. At that visit she was started on unknown pain medication was to be dosed initially at hour of  sleep and if she tolerated daytime dosing was to be added in. Patient was sent to this hospital today because of altered mentation involving slurred/garbled speech without other focal neurological deficits and hallucinatory behaviors. In discussing with the daughter patient has not yet returned to baseline. She reported regarding the new pain medication added by the orthopedic physician. Patient primarily complains of weakness and daughter confirms she does not drink enough oral fluids. Patient was also recently started on a PPI for chest burning and reflux symptoms. In regards to the patient's hallucinatory behaviors the nursing facility she apparently had been seeing and son the window seal while pointing to the wall of the exam room.  ER patient was afebrile, blood pressure was 173/37, she was in atrial fibrillation, pulse was 66 and irregular, respirations 22 and room air saturations were percent. Laboratory data unremarkable except for mild azotemia with a BUN of 23 baseline 13 and creatinine 1.22 with baseline 0.95. Co-slightly elevated, AST elevated at 58 otherwise transaminases normal. Mild elevation and point-of-care troponin 0.11 with serum troponin for confirmation pending. EKG was unremarkable. Patient had mild leukocytosis white count 10,600, hemoglobin stable and 0.5, platelets normal 243,000. Urinalysis was unremarkable except for trace leukocytes. I'll call levels less than 5, urine drug screen was negative. CT of the head showed no acute changes.  Hospital Course:    Altered mental status -Patient presented to the hospital with altered mental status, MRI was done showed no acute events. -Urinalysis was done as well as chest x-ray no evidence of infection, no fever no leukocytosis. -Cannot rule out medications as cause  of her altered mental status including Namenda and Desyrel. -Continues to be awake and alert, complains about back pain. -On discharge recommended discontinue Namenda and  continue Desyrel.  Elevated troponin -Troponin is 0.16 with peak of 0.25, patient is DNR and has advanced dementia, likely to treat medically. -EKG does not show any evidence of ischemia. -Was not started on beta blockers because of history of pauses/bradycardia. -Cardiology evaluated the patient, recommended no further intervention.  Dehydration/Acute kidney injury/CKD, stage III -Mild in nature and patient with documented poor intake in relation to her underlying dementia and known failure to thrive. -Gentle IV fluid hydration -Creatinine at baseline at 1.13.  History bradycardia and sinus pauses -Loop recorder fully interrogated by cardiology. -Cardiology to evaluate.  Dementia with Lewy bodies -Namenda on hold secondary to concerns contribute to patient's acute altered mentation -Patient has tolerated nocturnal trazodone.  HLD  -Based on last admission patient was taking omega-3 fatty acids which I have resumed  Elevated transaminase level -Likely related to dehydration -Repeat in a.m.  Acid reflux -Continue Prilosec  Hemiparesis 2/2 stroke/ History of CVA  -Continue to use Rollator with ambulation and always assist patient out of bed to chair or with other activities out of bed  Chronic LBP -Previously on short term Vicodin but this was dc'd upon return to SNF and she is now on prn Tylenol  FTT in adult -Likely related to underlying dementia -Provide protein supplementation  Prolonged QT interval -Was not present on previous EKG and potentially rate related -Repeat EKG in a.m. -Avoid offending medications such as fluoroquinolones, Zofran and Haldol    Procedures:  None  Consultations:  None  Discharge Exam: Filed Vitals:   01/06/15 1210  BP: 151/48  Pulse: 81  Temp: 98 F (36.7 C)  Resp: 20   General: Alert and awake, oriented x3, not in any acute distress. HEENT: anicteric sclera, pupils reactive to light and accommodation, EOMI CVS: S1-S2  clear, no murmur rubs or gallops Chest: clear to auscultation bilaterally, no wheezing, rales or rhonchi Abdomen: soft nontender, nondistended, normal bowel sounds, no organomegaly Extremities: no cyanosis, clubbing or edema noted bilaterally Neuro: Cranial nerves II-XII intact, no focal neurological deficits   Discharge Instructions    Current Discharge Medication List    CONTINUE these medications which have NOT CHANGED   Details  acetaminophen (TYLENOL) 500 MG tablet Take 500 mg by mouth every 6 (six) hours as needed.    aspirin 81 MG tablet Take 81 mg by mouth daily.    Cranberry 450 MG CAPS Take 1 tablet by mouth 2 (two) times daily.     diphenhydrAMINE (SOMINEX) 25 MG tablet Take 25 mg by mouth every 8 (eight) hours as needed for itching or sleep.     feeding supplement (BOOST HIGH PROTEIN) LIQD Take 1 Container by mouth 2 (two) times daily between meals.    HYDROcodone-acetaminophen (NORCO/VICODIN) 5-325 MG per tablet Take 1 tablet by mouth 2 (two) times daily. Take twice every day for OA per [facility] Qty: 15 tablet, Refills: 0    loratadine (CLARITIN) 10 MG tablet Take 10 mg by mouth daily.    Melatonin 3 MG CAPS Take 3 mg by mouth at bedtime.    Omega-3 Fatty Acids (FISH OIL) 1000 MG CAPS Take 1,000 mg by mouth daily.    omeprazole (PRILOSEC) 20 MG capsule Take 20 mg by mouth daily.    polyethylene glycol (MIRALAX / GLYCOLAX) packet Take 17 g by mouth every other day. Qty: 14 each,  Refills: 0    traZODone (DESYREL) 50 MG tablet Take 50 mg by mouth at bedtime.    zinc oxide 20 % ointment Apply 1 application topically as needed for irritation.    ketoconazole (NIZORAL) 2 % shampoo Apply 1 application topically 2 (two) times a week.      STOP taking these medications     cephALEXin (KEFLEX) 500 MG capsule      memantine (NAMENDA TITRATION PAK) tablet pack        Allergies  Allergen Reactions  . Alendronate Sodium     unknown  . Simvastatin     unknown    Follow-up Information    Follow up with Lewayne Bunting, MD On 01/28/2015.   Specialty:  Cardiology   Why:  Cardiology Follow-Up with Dr. Ladona Ridgel on 01/28/2015 at 10:15AM.   Contact information:   [address] [place] [address] [phone]        The results of significant diagnostics from this hospitalization (including imaging, microbiology, ancillary and laboratory) are listed below for reference.    Significant Diagnostic Studies: Dg Ribs Bilateral W/chest  01/03/2015   CLINICAL DATA:  Status post fall from bed with left-sided back and thoracic spine pain.  EXAM: BILATERAL RIBS AND CHEST - 4+ VIEW  COMPARISON:  None.  FINDINGS: No fracture or other bone lesions are seen involving the ribs. There is no evidence of pneumothorax or pleural effusion. Both lungs are clear. Heart size and mediastinal contours are within normal limits. An electronic apparatus overlies the left lower thorax.  IMPRESSION: No evidence of displaced rib fractures.   Electronically Signed   By: Ted Mcalpine M.D.   On: 01/03/2015 19:44   Dg Thoracic Spine W/swimmers  01/03/2015   CLINICAL DATA:  Back pain status post fall.  EXAM: THORACIC SPINE - 3 VIEWS  COMPARISON:  None.  FINDINGS: There is no evidence of thoracic spine fracture. There is thoracic spine kyphosis, likely secondary to osteoarthritic changes. Mild height loss of T11, T12, and L1 is noted, likely related to degenerative changes. No other significant bone abnormalities are identified. Electronic apparatus overlies the lower left thorax.  IMPRESSION: No evidence of displaced fracture of the thoracic spine.  Osteoarthritic changes, with likely degenerative height loss of T11, T12 and L1 vertebral bodies. Please correlate to patient's clinical exam.   Electronically Signed   By: Ted Mcalpine M.D.   On: 01/03/2015 14:41   Dg Lumbar Spine 2-3 Views  01/05/2015   CLINICAL DATA:  Lower back pain, lumbrosacral spondylosis.   EXAM: LUMBAR SPINE - 2-3 VIEW  COMPARISON:  Aug 30, 2012.  FINDINGS: Diffuse osteopenia is noted. No fracture is noted. Severe degenerative disc disease is noted at L2-3 which is unchanged compared to prior exam. Stable grade 1 anterolisthesis of L4-5 is noted. Moderate levoscoliosis of the lumbar spine is noted. Lateral subluxation of L4 on L5 to the left is noted.  IMPRESSION: Stable chronic findings as described above. No acute abnormality seen.   Electronically Signed   By: Lupita Raider, M.D.   On: 01/05/2015 14:15   Ct Head Wo Contrast  01/03/2015   CLINICAL DATA:  Patient fell. Found at the foot upper been. Confused with neck pain.  EXAM: CT HEAD WITHOUT CONTRAST  CT CERVICAL SPINE WITHOUT CONTRAST  TECHNIQUE: Multidetector CT imaging of the head and cervical spine was performed following the standard protocol without intravenous contrast. Multiplanar CT image reconstructions of the cervical spine were also generated.  COMPARISON:  04/24/2014  FINDINGS: CT HEAD FINDINGS  Ventricles are enlarged, to a greater degree than the sulci, but are stable from the prior CT. Findings are consistent with advanced atrophy.  There are no parenchymal masses or mass effect.  There are multiple old infarcts involving both frontal lobes and the right external capsule on the left thalamus. White matter hypoattenuation is seen more diffusely consistent with moderate to advanced chronic microvascular ischemic change.  There is no evidence of a recent cortical infarct.  There are no extra-axial masses or abnormal fluid collections.  There is no intracranial hemorrhage.  No skull fracture. Moderate mucosal thickening lines the ethmoid air cells. There is mild maxillary sinus mucosal thickening. Mucous retention cyst lies in the right sphenoid sinus. Clear mastoid air cells.  CT CERVICAL SPINE FINDINGS  No fracture. No spondylolisthesis. There is marked loss of disc height at C5-C6. Moderate loss disc height is noted at C6-C7.  There is mild to moderate loss of disc height at C2-C3 and C3-C4. Facet degenerative changes noted bilaterally. There is acquired fusion of the right facets at C2-C3 and C3-C4. Bones are diffusely demineralized. There multiple levels of neural foraminal narrowing. Soft tissues are unremarkable. Scarring is noted at the lung apices.  IMPRESSION: HEAD CT: No acute intracranial abnormalities. Advanced atrophy, chronic microvascular ischemic change and multiple old infarcts.  CERVICAL CT: No fracture or acute finding. Advanced degenerative changes.   Electronically Signed   By: Amie Portland M.D.   On: 01/03/2015 15:05   Ct Cervical Spine Wo Contrast  01/03/2015   CLINICAL DATA:  Patient fell. Found at the foot upper been. Confused with neck pain.  EXAM: CT HEAD WITHOUT CONTRAST  CT CERVICAL SPINE WITHOUT CONTRAST  TECHNIQUE: Multidetector CT imaging of the head and cervical spine was performed following the standard protocol without intravenous contrast. Multiplanar CT image reconstructions of the cervical spine were also generated.  COMPARISON:  04/24/2014  FINDINGS: CT HEAD FINDINGS  Ventricles are enlarged, to a greater degree than the sulci, but are stable from the prior CT. Findings are consistent with advanced atrophy.  There are no parenchymal masses or mass effect.  There are multiple old infarcts involving both frontal lobes and the right external capsule on the left thalamus. White matter hypoattenuation is seen more diffusely consistent with moderate to advanced chronic microvascular ischemic change.  There is no evidence of a recent cortical infarct.  There are no extra-axial masses or abnormal fluid collections.  There is no intracranial hemorrhage.  No skull fracture. Moderate mucosal thickening lines the ethmoid air cells. There is mild maxillary sinus mucosal thickening. Mucous retention cyst lies in the right sphenoid sinus. Clear mastoid air cells.  CT CERVICAL SPINE FINDINGS  No fracture. No  spondylolisthesis. There is marked loss of disc height at C5-C6. Moderate loss disc height is noted at C6-C7. There is mild to moderate loss of disc height at C2-C3 and C3-C4. Facet degenerative changes noted bilaterally. There is acquired fusion of the right facets at C2-C3 and C3-C4. Bones are diffusely demineralized. There multiple levels of neural foraminal narrowing. Soft tissues are unremarkable. Scarring is noted at the lung apices.  IMPRESSION: HEAD CT: No acute intracranial abnormalities. Advanced atrophy, chronic microvascular ischemic change and multiple old infarcts.  CERVICAL CT: No fracture or acute finding. Advanced degenerative changes.   Electronically Signed   By: Amie Portland M.D.   On: 01/03/2015 15:05   Mr Brain Wo Contrast  01/03/2015   CLINICAL DATA:  Dementia with combative behavior. Fell earlier today. Hallucinating. Agitation.  EXAM: MRI HEAD WITHOUT CONTRAST  TECHNIQUE: Multiplanar, multiecho pulse sequences of the brain and surrounding structures were obtained without intravenous contrast.  COMPARISON:  CT earlier same day  FINDINGS: The study suffers from motion degradation. The patient would not allow scanning beyond the diffusion imaging and 8 markedly motion degraded T2 exam.  There is no evidence of acute infarction, mass lesion, hemorrhage, obstructive hydrocephalus or extra-axial collection. There is an old left thalamic infarction. There are chronic small-vessel ischemic changes throughout the white matter. Enlargement of the ventricles is presumed secondary to central atrophy. There are old bifrontal cortical and subcortical infarctions as seen previously.  IMPRESSION: Markedly motion degraded and foreshortened exam because of the patient's condition. No sign of acute infarction. Atrophy and chronic small vessel ischemic changes. Old bifrontal cortical and subcortical infarctions.   Electronically Signed   By: Paulina Fusi M.D.   On: 01/03/2015 18:45   Dg Knee Complete 4  Views Right  01/03/2015   CLINICAL DATA:  Fall, acute anterior right knee pain  EXAM: RIGHT KNEE - COMPLETE 4+ VIEW  COMPARISON:  None.  FINDINGS: Bones are osteopenic. Moderate tricompartmental osteoarthritis with joint space loss, sclerosis and bony spurring. Atrophy of the extremity musculature. Normal alignment without acute fracture or effusion.  IMPRESSION: Tricompartmental osteoarthritis.  Osteopenia.  No acute finding by plain radiography.   Electronically Signed   By: Judie Petit.  Shick M.D.   On: 01/03/2015 14:38    Microbiology: Recent Results (from the past 240 hour(s))  Urine culture     Status: None   Collection Time: 01/03/15  1:16 PM  Result Value Ref Range Status   Specimen Description URINE, CLEAN CATCH  Final   Special Requests NONE  Final   Culture 80,000 COLONIES/ml ENTEROCOCCUS SPECIES  Final   Report Status 01/05/2015 FINAL  Final   Organism ID, Bacteria ENTEROCOCCUS SPECIES  Final      Susceptibility   Enterococcus species - MIC*    AMPICILLIN <=2 SENSITIVE Sensitive     LEVOFLOXACIN 1 SENSITIVE Sensitive     NITROFURANTOIN <=16 SENSITIVE Sensitive     VANCOMYCIN <=0.5 SENSITIVE Sensitive     * 80,000 COLONIES/ml ENTEROCOCCUS SPECIES     Labs: Basic Metabolic Panel:  Recent Labs Lab 01/03/15 1309 01/03/15 1315 01/04/15 0845 01/06/15 0439  NA 140 139 140 139  K 4.1 4.1 4.4 3.8  CL 107 107 109 109  CO2  --  22 21* 23  GLUCOSE 111* 112* 116* 117*  BUN 24* 23* 20 14  CREATININE 1.20* 1.22* 1.13* 0.96  CALCIUM  --  9.8 9.3 9.1   Liver Function Tests:  Recent Labs Lab 01/03/15 1315 01/04/15 0845  AST 58* 42*  ALT 33 26  ALKPHOS 62 56  BILITOT 1.0 1.1  PROT 6.5 5.7*  ALBUMIN 3.9 3.3*   No results for input(s): LIPASE, AMYLASE in the last 168 hours. No results for input(s): AMMONIA in the last 168 hours. CBC:  Recent Labs Lab 01/03/15 1309 01/03/15 1315 01/04/15 0845  WBC  --  10.6* 7.6  NEUTROABS  --  8.2*  --   HGB 12.2 11.5* 10.8*  HCT 36.0  34.2* 33.2*  MCV  --  93.7 95.1  PLT  --  243 229   Cardiac Enzymes:  Recent Labs Lab 01/03/15 1643 01/03/15 2030 01/04/15 0134 01/04/15 0845  CKTOTAL 1050*  --   --   --   TROPONINI 0.16* 0.18*  0.25* 0.15*   BNP: BNP (last 3 results) No results for input(s): BNP in the last 8760 hours.  ProBNP (last 3 results) No results for input(s): PROBNP in the last 8760 hours.  CBG: No results for input(s): GLUCAP in the last 168 hours.     Signed:  ELMAHI,MUTAZ A  [facility] 01/06/2015, 1:55 PM

## 2015-01-06 NOTE — Progress Notes (Signed)
Hulan Saas Worker following case for transition back to nursing facility. Abelino Derrick [facility] [phone]

## 2015-01-06 NOTE — Progress Notes (Signed)
Instructed by Dr. Arthor Captain this am that pt will be going home and not to hang a new bag of IVF.  Pt's primary nurse made aware.  Amanda Pea, Charity fundraiser.

## 2015-01-07 NOTE — Progress Notes (Signed)
Ok per MD for d/c today back to [facility]- ALF via car with daughter [name].  Fl2 updated and sent with DC summary to facility for approval by Marylene Land.  Nursing notified of d/c plan.  Patient notified of d/c; she is alert to person only; very pleasant.  No further CSW needs identified. CSW signing off.  Lorri Frederick. Jaci Lazier, [place] [phone]

## 2015-01-07 NOTE — Clinical Social Work Note (Addendum)
Clinical Social Work Assessment  Patient Details  Name: Tracey [name] MRN: [id number] Date of Birth: 11-08-1930  Date of referral:  01/06/15               Reason for consult:  Other (Comment Required) (Return to ALF- [facility])                Permission sought to share information with:    Permission granted to share information::  Yes, Verbal Permission Granted (By Daughter Elisabeth Most)  Name::     [facility]    Housing/Transportation Living arrangements for the past 2 months:  Assisted Living Facility Source of Information:  Adult Children, Patient Patient Interpreter Needed:  None Criminal Activity/Legal Involvement Pertinent to Current Situation/Hospitalization:  No - Comment as needed Significant Relationships:  Adult Children Lives with:   Assisted Living Do you feel safe going back to the place where you live?  Yes Need for family participation in patient care:  Yes (Comment)  Care giving concerns:   Admitted from ALF with Altered Mental Status and s/p fall.  No concerns noted by patient or daughter re: d/c plan.  Social Worker assessment / plan:  CSW referred to see this 81 year old female admitted from [facility]F where she is a long term care resident.  Patient noted to be alert to person only but very pleasant.  CSW spoke to staff at ALF who indicated that patient is well managed at facility and want her to return there when stable.  Ok per MD for d/c today back to facility.  Notified patient's daughter Sarajane Jews who as pleased with d/c plan and stated that she would transport her mother via car this afternoon.  Fl2 completed and sent to facility with d/c summary for review by [facility] . Facility has own PT services and will arrange at d/c.  Information placed on Fl2.  Employment status:  Retired Engineer, mining PT Recommendations:  Home with Home Health Information / Referral to community resources:   (Will require Home Health PT at  ALF)  Patient/Family's Response to care:  Patient happy and smiling during CSW's visit. Daughter denies any concerns about current care.  Patient/Family's Understanding of and Emotional Response to Diagnosis, Current Treatment, and Prognosis: Patient is alert to person only- unable to respond to diagnosis, treatment plan or prognosis. Daughter is very involved and asked appropriate questions. She appears to have a very good understanding of patient's condition and d/c needs.    Emotional Assessment  Appearance:  Appears stated age Attitude/Demeanor/Rapport:   (Calm, relaxed, cooperative, confused to all but person) Affect (typically observed):  Pleasant, Quiet, Calm, Happy Orientation:  Oriented to Self Alcohol / Substance use:  Never Used Psych involvement (Current and /or in the community):  No (Comment)  Discharge Needs  Concerns to be addressed:  Care Coordination Readmission within the last 30 days:  Yes Current discharge risk:  Cognitively Impaired Barriers to Discharge:  Continued Medical Work up   Lovette Cliche T, [place] 01/06/2015    2:10 PM

## 2015-01-07 NOTE — Progress Notes (Signed)
Loop recorder 

## 2015-01-09 ENCOUNTER — Ambulatory Visit
Admission: RE | Admit: 2015-01-09 | Discharge: 2015-01-09 | Disposition: A | Discharge status: Home / Self Care | Payer: Medicare Other | Source: Ambulatory Visit | Attending: Physician Assistant | Admitting: Physician Assistant

## 2015-01-09 DIAGNOSIS — M5136 Other intervertebral disc degeneration, lumbar region: Secondary | ICD-10-CM

## 2015-01-09 MED ORDER — IOHEXOL 180 MG/ML  SOLN
1.0000 mL | Freq: Once | INTRAMUSCULAR | Status: DC | PRN
  Administered 2015-01-09: 1 mL via EPIDURAL

## 2015-01-09 MED ORDER — METHYLPREDNISOLONE ACETATE 40 MG/ML INJ SUSP (RADIOLOG
120.0000 mg | Freq: Once | INTRAMUSCULAR | Status: AC
Start: 2015-01-09 — End: 2015-01-09
  Administered 2015-01-09: 120 mg via EPIDURAL

## 2015-01-09 NOTE — Discharge Instructions (Signed)

## 2015-01-12 LAB — CUP PACEART REMOTE DEVICE CHECK: Date Time Interrogation Session: 20160821193648

## 2015-01-12 NOTE — Progress Notes (Signed)
[facility] summary report received. Battery status OK. Normal device function. No new symptom or tachy episodes. 13 brady episodes, 571 pause episodes (duration 3-4 secs), all appropriate from available EGMs, daughter thinks she was asymptomatic, pt has dementia. 1 AF episode, no EGM available ([facility] tech services troubleshooting), duration ~4 min, +ASA . Monthly summary reports and ROV with GT on 01/28/15 at 10:15am.

## 2015-01-16 ENCOUNTER — Other Ambulatory Visit: Payer: Self-pay | Admitting: Physician Assistant

## 2015-01-16 DIAGNOSIS — R1310 Dysphagia, unspecified: Secondary | ICD-10-CM

## 2015-01-19 ENCOUNTER — Other Ambulatory Visit: Payer: Self-pay

## 2015-01-19 MED ORDER — MEMANTINE HCL 28 X 5 MG & 21 X 10 MG PO TABS: ORAL_TABLET | ORAL | Status: DC

## 2015-01-20 ENCOUNTER — Other Ambulatory Visit: Payer: Medicare Other

## 2015-01-20 ENCOUNTER — Encounter: Payer: Medicare Other | Admitting: Internal Medicine

## 2015-01-21 ENCOUNTER — Encounter: Payer: Self-pay | Admitting: Internal Medicine

## 2015-01-22 ENCOUNTER — Other Ambulatory Visit: Payer: Medicare Other

## 2015-01-22 ENCOUNTER — Encounter: Payer: Self-pay | Admitting: Internal Medicine

## 2015-01-23 ENCOUNTER — Ambulatory Visit
Admission: RE | Admit: 2015-01-23 | Discharge: 2015-01-23 | Disposition: A | Discharge status: Home / Self Care | Payer: Medicare Other | Source: Ambulatory Visit | Attending: Physician Assistant | Admitting: Physician Assistant

## 2015-01-23 DIAGNOSIS — R1310 Dysphagia, unspecified: Secondary | ICD-10-CM

## 2015-01-28 ENCOUNTER — Ambulatory Visit (INDEPENDENT_AMBULATORY_CARE_PROVIDER_SITE_OTHER): Payer: Medicare Other | Admitting: Internal Medicine

## 2015-01-28 ENCOUNTER — Encounter: Payer: Self-pay | Admitting: Internal Medicine

## 2015-01-28 VITALS — BP 82/42 | HR 46 | Ht 63.0 in | Wt 109.0 lb

## 2015-01-28 DIAGNOSIS — I6340 Cerebral infarction due to embolism of unspecified cerebral artery: Secondary | ICD-10-CM | POA: Diagnosis not present

## 2015-01-28 DIAGNOSIS — I639 Cerebral infarction, unspecified: Secondary | ICD-10-CM | POA: Insufficient documentation

## 2015-01-28 DIAGNOSIS — R001 Bradycardia, unspecified: Secondary | ICD-10-CM

## 2015-01-28 NOTE — Patient Instructions (Signed)

## 2015-01-28 NOTE — Assessment & Plan Note (Signed)
She has had mostly nocturnal bradycardia based on her device interogation. We have reprogrammed her device to save events of longer duration. We currently cannot descriminate daytime from nightime events.

## 2015-01-28 NOTE — Progress Notes (Signed)
HPI Mrs. Bango returns today for ongoing evaluation of her cryptogenic stroke, s/p ILR insertion. She has dementia. She has had weak periods over the past few months. No frank syncope. No new neuro deficits. Her memory is poor.  Allergies  Allergen Reactions  . Alendronate Sodium     unknown  . Namenda [Memantine Hcl] Other (See Comments)    halucinations  . Simvastatin     unknown     Current Outpatient Prescriptions  Medication Sig Dispense Refill  . acetaminophen (TYLENOL) 500 MG tablet Take 500 mg by mouth every 6 (six) hours as needed.    Marland Kitchen. aspirin 81 MG tablet Take 81 mg by mouth daily.    . Cranberry 450 MG CAPS Take 1 tablet by mouth 2 (two) times daily.     . diphenhydrAMINE (SOMINEX) 25 MG tablet Take 25 mg by mouth every 8 (eight) hours as needed for itching or sleep.     . feeding supplement (BOOST HIGH PROTEIN) LIQD Take 1 Container by mouth 2 (two) times daily between meals.    Marland Kitchen. ketoconazole (NIZORAL) 2 % shampoo Apply 1 application topically 2 (two) times a week.    . loratadine (CLARITIN) 10 MG tablet Take 10 mg by mouth daily.    . Melatonin 3 MG CAPS Take 3 mg by mouth at bedtime.    . Omega-3 Fatty Acids (FISH OIL) 1000 MG CAPS Take 1,000 mg by mouth daily.    Marland Kitchen. omeprazole (PRILOSEC) 20 MG capsule Take 20 mg by mouth daily.    . polyethylene glycol (MIRALAX / GLYCOLAX) packet Take 17 g by mouth every other day. 14 each 0  . zinc oxide 20 % ointment Apply 1 application topically as needed for irritation.     No current facility-administered medications for this visit.     Past Medical History  Diagnosis Date  . Memory deficit 10/17/2012  . Abnormality of gait 10/17/2012  . Urinary incontinence   . Hypertension   . Dyslipidemia   . Spinal stenosis   . Hemiparesis and alteration of sensations as late effects of stroke 06/24/2014  . Anemia   . Stroke 2000's    mild left hemiparesis  . Lumbosacral spondylosis   . Arthritis     "knees" (12/12/2014)  .  Spinal stenosis, lumbar   . Alzheimer disease     ROS:   All systems reviewed and negative except as noted in the HPI.   Past Surgical History  Procedure Laterality Date  . Tubal ligation Bilateral   . Cataract extraction w/ intraocular lens  implant, bilateral Bilateral   . Ep implantable device N/A 09/08/2014    Procedure: Loop Recorder Insertion;  Surgeon: Marinus MawGregg W Taylor, MD;  Location: [facility];  Service: Cardiovascular;  Laterality: N/A;     Family History  Problem Relation Age of Onset  . Parkinsonism Brother   . Dementia Brother   . Heart disease Brother      Social History   Social History  . Marital Status: Divorced    Spouse Name: N/A  . Number of Children: 2  . Years of Education: 16   Occupational History  . Retired Charity fundraiserN    Social History Main Topics  . Smoking status: Never Smoker   . Smokeless tobacco: Never Used  . Alcohol Use: 4.2 oz/week    7 Glasses of wine per week     Comment: 1 glass of wine per day  . Drug Use:  No  . Sexual Activity: Not on file   Other Topics Concern  . Not on file   Social History Narrative   Lives alone in one story home.  Has 2 children.  Retired Charity fundraiser.       BP 82/42 mmHg  Pulse 46  Ht  (1.6 m)  Wt 109 lb (49.442 kg)  BMI 19.31 kg/m2  Physical Exam:  stable appearing elderly woman, NAD HEENT: Unremarkable Neck:  6 cm JVD, no thyromegally Back:  No CVA tenderness Lungs:  Clear with no wheezes HEART:  Regular rate rhythm, no murmurs, no rubs, no clicks Abd:  soft, positive bowel sounds, no organomegally, no rebound, no guarding Ext:  2 plus pulses, no edema, no cyanosis, no clubbing Skin:  No rashes no nodules Neuro:  CN II through XII intact, motor grossly intact  EKG  DEVICE  Normal device function.  See PaceArt for details.   Assess/Plan:

## 2015-01-28 NOTE — Assessment & Plan Note (Signed)
The mechanism is still unknown. She has not had atrial fib on the ILR monitor. Will follow.

## 2015-02-03 LAB — CUP PACEART REMOTE DEVICE CHECK: Date Time Interrogation Session: 20160920200634

## 2015-02-03 NOTE — Progress Notes (Signed)
Carelink summary report received. Battery status OK. Normal device function. No new symptom episodes. No tachy episodes. 30 brady episodes. 145 pauses, all appropriate. 6 AF episodes. Monthly summary reports and ROV w/ GT 01/28/15 to reprogram detection parameters.

## 2015-02-05 ENCOUNTER — Ambulatory Visit (INDEPENDENT_AMBULATORY_CARE_PROVIDER_SITE_OTHER): Payer: Medicare Other | Admitting: *Deleted

## 2015-02-05 DIAGNOSIS — I639 Cerebral infarction, unspecified: Secondary | ICD-10-CM

## 2015-02-09 NOTE — Progress Notes (Signed)
Loop recorder 

## 2015-02-10 ENCOUNTER — Telehealth: Payer: Self-pay | Admitting: *Deleted

## 2015-02-10 NOTE — Telephone Encounter (Addendum)
Called patient's daughter, Chip BoerVicki, regarding brady episode on 02/08/15.  Chip BoerVicki states that her mother lives in a SNF and that the only time she is contacted by the facility is if her mother has a fall.  She states that the patient is in a private room so there is no way to know if the patient was symptomatic unless she has a fall or loses consciousness (at that point, the facility calls 911 and calls Chip BoerVicki).  Encouraged Chip BoerVicki to call us if she becomes aware of any symptomatic episodes.  Reviewed with Dr. Ladona Ridgelaylor, no additional recommendations or changes to plan of care at this time.

## 2015-02-11 DIAGNOSIS — I480 Paroxysmal atrial fibrillation: Secondary | ICD-10-CM | POA: Insufficient documentation

## 2015-02-16 ENCOUNTER — Encounter: Payer: Medicare Other | Admitting: Internal Medicine

## 2015-02-18 ENCOUNTER — Encounter: Payer: Self-pay | Admitting: Internal Medicine

## 2015-02-24 LAB — CUP PACEART REMOTE DEVICE CHECK: MDC IDC SESS DTM: 20161020200641

## 2015-02-24 NOTE — Progress Notes (Signed)
Carelink summary report received. Battery status OK. Normal device function. No new symptom or tachy episodes. 75 pause and 2 brady episodes, asymptomatic as far as family/caregivers can tell. 18 AF episodes (0.2%), +ASA 81mg , episodes reviewed by GT in clinic on 01/28/15--not true AF. Patient has a high fall risk, recent fall on 01/03/15 per ED notes. Monthly summary reports and ROV with GT in 01/2016.

## 2015-02-26 ENCOUNTER — Encounter: Payer: Self-pay | Admitting: Cardiology

## 2015-03-05 ENCOUNTER — Encounter: Payer: Self-pay | Admitting: Cardiology

## 2015-03-06 ENCOUNTER — Telehealth: Payer: Self-pay | Admitting: Internal Medicine

## 2015-03-06 NOTE — Telephone Encounter (Signed)
Spoke w/ pt daughter and instructed her how to send manual transmission w/ home monitor. Pt daughter verbalized understanding.  

## 2015-03-06 NOTE — Telephone Encounter (Signed)
New message      Pt is due to have a remote check on Monday.  Please call and tell them how to do a remote check

## 2015-03-09 ENCOUNTER — Ambulatory Visit (INDEPENDENT_AMBULATORY_CARE_PROVIDER_SITE_OTHER): Payer: Medicare Other | Admitting: *Deleted

## 2015-03-09 DIAGNOSIS — I639 Cerebral infarction, unspecified: Secondary | ICD-10-CM | POA: Diagnosis not present

## 2015-03-10 NOTE — Progress Notes (Signed)
LOOP RECORDER  

## 2015-03-31 ENCOUNTER — Encounter: Payer: Self-pay | Admitting: Internal Medicine

## 2015-04-08 ENCOUNTER — Ambulatory Visit (INDEPENDENT_AMBULATORY_CARE_PROVIDER_SITE_OTHER): Payer: Medicare Other | Admitting: *Deleted

## 2015-04-08 DIAGNOSIS — I639 Cerebral infarction, unspecified: Secondary | ICD-10-CM | POA: Diagnosis not present

## 2015-04-08 NOTE — Progress Notes (Signed)
Carelink Summary Report / Loop Recorder 

## 2015-04-17 ENCOUNTER — Encounter: Payer: Self-pay | Admitting: *Deleted

## 2015-04-17 LAB — CUP PACEART REMOTE DEVICE CHECK: Date Time Interrogation Session: 20161120191126

## 2015-04-25 LAB — CUP PACEART REMOTE DEVICE CHECK: MDC IDC SESS DTM: 20161219203823

## 2015-05-06 ENCOUNTER — Ambulatory Visit (INDEPENDENT_AMBULATORY_CARE_PROVIDER_SITE_OTHER): Payer: Medicare Other | Admitting: *Deleted

## 2015-05-06 DIAGNOSIS — I639 Cerebral infarction, unspecified: Secondary | ICD-10-CM | POA: Diagnosis not present

## 2015-05-07 NOTE — Progress Notes (Signed)
Carelink Summary Report / Loop Recorder 

## 2015-05-27 ENCOUNTER — Other Ambulatory Visit: Payer: Self-pay | Admitting: Family Medicine

## 2015-05-27 DIAGNOSIS — R0989 Other specified symptoms and signs involving the circulatory and respiratory systems: Secondary | ICD-10-CM

## 2015-06-02 ENCOUNTER — Ambulatory Visit
Admission: RE | Admit: 2015-06-02 | Discharge: 2015-06-02 | Disposition: A | Discharge status: Home / Self Care | Payer: Medicare Other | Source: Ambulatory Visit | Attending: Family Medicine | Admitting: Family Medicine

## 2015-06-02 DIAGNOSIS — R0989 Other specified symptoms and signs involving the circulatory and respiratory systems: Secondary | ICD-10-CM

## 2015-06-05 ENCOUNTER — Ambulatory Visit (INDEPENDENT_AMBULATORY_CARE_PROVIDER_SITE_OTHER): Payer: Medicare Other | Admitting: *Deleted

## 2015-06-05 DIAGNOSIS — I639 Cerebral infarction, unspecified: Secondary | ICD-10-CM

## 2015-06-08 NOTE — Progress Notes (Signed)
Carelink Summary Report / Loop Recorder 

## 2015-06-15 ENCOUNTER — Encounter: Payer: Self-pay | Admitting: Internal Medicine

## 2015-06-19 ENCOUNTER — Telehealth: Payer: Self-pay | Admitting: *Deleted

## 2015-06-19 NOTE — Telephone Encounter (Signed)
Called patient's daughter to schedule appointment per Dr. Ladona Ridgelaylor.  Advised that this appointment is to discuss loop recorder results.  Patient's daughter is agreeable to an appointment on 07/21/15 at 2:00pm.  She denies any additional questions or concerns at this time.

## 2015-06-25 LAB — CUP PACEART REMOTE DEVICE CHECK: MDC IDC SESS DTM: 20170118203807

## 2015-06-30 ENCOUNTER — Ambulatory Visit: Payer: Medicare Other | Admitting: Adult Health

## 2015-07-06 ENCOUNTER — Ambulatory Visit (INDEPENDENT_AMBULATORY_CARE_PROVIDER_SITE_OTHER): Payer: Medicare Other | Admitting: *Deleted

## 2015-07-06 DIAGNOSIS — I639 Cerebral infarction, unspecified: Secondary | ICD-10-CM

## 2015-07-07 ENCOUNTER — Ambulatory Visit (INDEPENDENT_AMBULATORY_CARE_PROVIDER_SITE_OTHER): Payer: Medicare Other | Admitting: Adult Health

## 2015-07-07 ENCOUNTER — Encounter: Payer: Self-pay | Admitting: Adult Health

## 2015-07-07 VITALS — Temp 97.0°F | Ht 63.0 in | Wt 118.0 lb

## 2015-07-07 DIAGNOSIS — F0390 Unspecified dementia without behavioral disturbance: Secondary | ICD-10-CM

## 2015-07-07 DIAGNOSIS — I639 Cerebral infarction, unspecified: Secondary | ICD-10-CM

## 2015-07-07 DIAGNOSIS — R269 Unspecified abnormalities of gait and mobility: Secondary | ICD-10-CM | POA: Diagnosis not present

## 2015-07-07 NOTE — Patient Instructions (Signed)
We will continue to monitor memory If your symptoms worsen or you develop new symptoms please let us know.   

## 2015-07-07 NOTE — Progress Notes (Signed)
Carelink Summary Report / Loop Recorder 

## 2015-07-07 NOTE — Progress Notes (Signed)
I have read the note, and I agree with the clinical assessment and plan.  Nea Gittens KEITH   

## 2015-07-07 NOTE — Progress Notes (Signed)
PATIENT: Tracey [name] DOB: 04/28/1930  REASON FOR VISIT: follow up- dementia, gait disorder HISTORY FROM: patient  HISTORY OF PRESENT ILLNESS: Ms. [name] is an 82 year old female with a history of cerebrovascular disease, dementia and gait disorder. She returns today for follow-up. The patient feels that her memory has remained the same. She was started on Namenda however she was unable to tolerate this. Her daughter states that about a week after starting Namenda her orthopedist place her on an additional medication. She states that these medications interacted and caused her to have hallucinations and altered mental status. She actually had to go to the hospital. Candiss Norseamenda was discontinued. [facility] Daughter has noticed that she has to repeat herself frequently. The patient feels that she has a good appetite however the daughter disagrees. She states that her mother eats very little unless it is sweets. She does require some assistance with ADLs. She was recently placed on Prozac for her mood. She denies any hallucinations. She uses a walker when ambulating. She denies any falls. Today she is in a wheelchair due to the distance. She returns today for an evaluation.  HISTORY 12/30/14: Ms. [name] is an 82 year old female with a history of cerebrovascular disease and dementia and a gait disorder. Since the last visit the patient did have an EEG which was unremarkable. Her MRI of the brain showed old infarcts and white matter disease. She had a carotid ultrasound showed 50-69% stenosis. The patient has continued on aspirin. The patient reports that she feels that her memory is slightly worse. She does live at an extended care facility. She states she is able to complete all ADLs independently. When she bathes she has the assistant come in and do her back. The patient had a loop recorder implanted. She states that it did show decrease heart rate at bedtime. For that reason  she was taken off of Aricept. The patient's daughter is with her at the visit today. She states that the patient's mood has been an ongoing issue. She was placed on trazodone 50 mg at bedtime. The patient complains of significant back pain. She has an appointment with Dr. Ethelene Halamos this month. The patient uses a walker to ambulate with. Denies any recent falls. Patient denies any new neurological symptoms. She returns today for an evaluation.  REVIEW OF SYSTEMS: Out of a complete 14 system review of symptoms, the patient complains only of the following symptoms, and all other reviewed systems are negative.  Activity change, appetite change, runny nose, back pain, walking difficulty, rash, confusion, weakness, memory loss, bruise/bleed easily  ALLERGIES: Allergies  Allergen Reactions  . Alendronate Sodium     unknown  . Namenda [Memantine Hcl] Other (See Comments)    halucinations  . Simvastatin     unknown    HOME MEDICATIONS: Outpatient Prescriptions Prior to Visit  Medication Sig Dispense Refill  . acetaminophen (TYLENOL) 500 MG tablet Take 500 mg by mouth every 6 (six) hours as needed.    Marland Kitchen. aspirin 81 MG tablet Take 81 mg by mouth daily.    . Cranberry 450 MG CAPS Take 1 tablet by mouth 2 (two) times daily.     . feeding supplement (BOOST HIGH PROTEIN) LIQD Take 1 Container by mouth 2 (two) times daily between meals.    Marland Kitchen. ketoconazole (NIZORAL) 2 % shampoo Apply 1 application topically 2 (two) times a week.    . loratadine (CLARITIN) 10 MG tablet Take 10 mg by  mouth daily.    . Melatonin 3 MG CAPS Take 3 mg by mouth at bedtime.    . Omega-3 Fatty Acids (FISH OIL) 1000 MG CAPS Take 1,000 mg by mouth daily.    Marland Kitchen omeprazole (PRILOSEC) 20 MG capsule Take 20 mg by mouth daily.    . polyethylene glycol (MIRALAX / GLYCOLAX) packet Take 17 g by mouth every other day. 14 each 0  . zinc oxide 20 % ointment Apply 1 application topically as needed for irritation.    . diphenhydrAMINE (SOMINEX) 25  MG tablet Take 25 mg by mouth every 8 (eight) hours as needed for itching or sleep. Reported on 07/07/2015     No facility-administered medications prior to visit.    PAST MEDICAL HISTORY: Past Medical History  Diagnosis Date  . Memory deficit 10/17/2012  . Abnormality of gait 10/17/2012  . Urinary incontinence   . Hypertension   . Dyslipidemia   . Spinal stenosis   . Hemiparesis and alteration of sensations as late effects of stroke (HCC) 06/24/2014  . Anemia   . Stroke (HCC) 2000's    mild left hemiparesis  . Lumbosacral spondylosis   . Arthritis     "knees" (12/12/2014)  . Spinal stenosis, lumbar   . Alzheimer disease     PAST SURGICAL HISTORY: Past Surgical History  Procedure Laterality Date  . Tubal ligation Bilateral   . Cataract extraction w/ intraocular lens  implant, bilateral Bilateral   . Ep implantable device N/A 09/08/2014    Procedure: Loop Recorder Insertion;  Surgeon: Marinus Maw, MD;  Location: [facility];  Service: Cardiovascular;  Laterality: N/A;    FAMILY HISTORY: Family History  Problem Relation Age of Onset  . Parkinsonism Brother   . Dementia Brother   . Heart disease Brother     SOCIAL HISTORY: Social History   Social History  . Marital Status: Divorced    Spouse Name: N/A  . Number of Children: 2  . Years of Education: 16   Occupational History  . Retired Charity fundraiser    Social History Main Topics  . Smoking status: Never Smoker   . Smokeless tobacco: Never Used  . Alcohol Use: 4.2 oz/week    7 Glasses of wine per week     Comment: 1 glass of wine per day  . Drug Use: No  . Sexual Activity: Not on file   Other Topics Concern  . Not on file   Social History Narrative   Lives alone in one story home.  Has 2 children.  Retired Charity fundraiser.        PHYSICAL EXAM  Filed Vitals:   07/07/15 0825  Temp: 97 F (36.1 C)  TempSrc: Oral  Height:  (1.6 m)  Weight: 118 lb (53.524 kg)   Body mass index is 20.91 kg/(m^2).  Generalized:  Well developed, in no acute distress   Neurological examination  Mentation: Alert Follows all commands speech and language fluent. MMSE 16/30 /Cranial nerve II-XII: Pupils were equal round reactive to light. Extraocular movements were full, visual field were full on confrontational test. Facial sensation and strength were normal. Uvula tongue midline. Head turning and shoulder shrug  were normal and symmetric. Motor: The motor testing reveals 5 over 5 strength of all 4 extremities. Good symmetric motor tone is noted throughout.  Sensory: Sensory testing is intact to soft touch on all 4 extremities. No evidence of extinction is noted.  Coordination: Cerebellar testing reveals good finger-nose-finger and heel-to-shin bilaterally.  Gait and station: Patient is in a wheelchair today Reflexes: Deep tendon reflexes are symmetric and normal bilaterally.   DIAGNOSTIC DATA (LABS, IMAGING, TESTING) - I reviewed patient records, labs, notes, testing and imaging myself where available.  Lab Results  Component Value Date   WBC 7.6 01/04/2015   HGB 10.8* 01/04/2015   HCT 33.2* 01/04/2015   MCV 95.1 01/04/2015   PLT 229 01/04/2015      Component Value Date/Time   NA 139 01/06/2015 0439   K 3.8 01/06/2015 0439   CL 109 01/06/2015 0439   CO2 23 01/06/2015 0439   GLUCOSE 117* 01/06/2015 0439   BUN 14 01/06/2015 0439   CREATININE 0.96 01/06/2015 0439   CALCIUM 9.1 01/06/2015 0439   PROT 5.7* 01/04/2015 0845   ALBUMIN 3.3* 01/04/2015 0845   AST 42* 01/04/2015 0845   ALT 26 01/04/2015 0845   ALKPHOS 56 01/04/2015 0845   BILITOT 1.1 01/04/2015 0845   GFRNONAA 54* 01/06/2015 0439   GFRAA >60 01/06/2015 0439   Lab Results  Component Value Date   CHOL 199 04/25/2014   HDL 75 04/25/2014   LDLCALC 100* 04/25/2014   TRIG 122 04/25/2014   CHOLHDL 2.7 04/25/2014       ASSESSMENT AND PLAN 82 y.o. year old female  has a past medical history of Memory deficit (10/17/2012); Abnormality of gait  (10/17/2012); Urinary incontinence; Hypertension; Dyslipidemia; Spinal stenosis; Hemiparesis and alteration of sensations as late effects of stroke (HCC) (06/24/2014); Anemia; Stroke (HCC) (2000's); Lumbosacral spondylosis; Arthritis; Spinal stenosis, lumbar; and Alzheimer disease. here with:  1. Dementia 2. Abnormality of gait  The patient's memory has remained stable. Her MMSE today is 16/30 was previously 17/30. Unfortunately she was unable to tolerate Aricept nor Namenda. We will continue to monitor her memory. They do not want to start any new medication at this time. Patient advised to use her walker at all times. She will follow-up in 6 months with Dr. Anne Hahn.    Butch Penny, MSN, NP-C 07/07/2015, 8:51 AM [facility] [address] [place], [place] [address] [phone]

## 2015-07-21 ENCOUNTER — Ambulatory Visit (INDEPENDENT_AMBULATORY_CARE_PROVIDER_SITE_OTHER): Payer: Medicare Other | Admitting: Internal Medicine

## 2015-07-21 ENCOUNTER — Encounter: Payer: Self-pay | Admitting: Internal Medicine

## 2015-07-21 VITALS — BP 102/54 | HR 40 | Ht 63.0 in | Wt 115.6 lb

## 2015-07-21 DIAGNOSIS — I480 Paroxysmal atrial fibrillation: Secondary | ICD-10-CM

## 2015-07-21 DIAGNOSIS — I639 Cerebral infarction, unspecified: Secondary | ICD-10-CM | POA: Diagnosis not present

## 2015-07-21 DIAGNOSIS — I495 Sick sinus syndrome: Secondary | ICD-10-CM | POA: Diagnosis not present

## 2015-07-21 MED ORDER — RIVAROXABAN 15 MG PO TABS: 15.0000 mg | ORAL_TABLET | Freq: Every day | ORAL | Status: AC

## 2015-07-21 MED ORDER — RIVAROXABAN 15 MG PO TABS: 15.0000 mg | ORAL_TABLET | Freq: Every day | ORAL | Status: DC

## 2015-07-21 NOTE — Patient Instructions (Addendum)
Medication Instructions:  Your physician has recommended you make the following change in your medication:  1) Start Xarelto 15 mg daily 2) Stop Aspirin   Labwork: None ordered   Testing/Procedures: None ordered   Follow-Up: Your physician wants you to follow-up in: 6 months with Dr Court Joyaylor You will receive a reminder letter in the mail two months in advance. If you don't receive a letter, please call our office to schedule the follow-up appointment.   Any Other Special Instructions Will Be Listed Below (If Applicable).     If you need a refill on your cardiac medications before your next appointment, please call your pharmacy.

## 2015-07-21 NOTE — Progress Notes (Signed)
HPI Tracey [name] returns today for ongoing evaluation of her cryptogenic stroke, s/p ILR insertion. She has dementia and this appears to be worsening. She has had weak periods over the past few months. No frank syncope. No new neuro deficits. Her memory is poor. She denies palpitations. Allergies  Allergen Reactions  . Alendronate Sodium     unknown  . Namenda [Memantine Hcl] Other (See Comments)    halucinations  . Simvastatin     unknown     Current Outpatient Prescriptions  Medication Sig Dispense Refill  . acetaminophen (TYLENOL) 500 MG tablet Take 500 mg by mouth every 6 (six) hours as needed.    . feeding supplement (BOOST HIGH PROTEIN) LIQD Take 1 Container by mouth 2 (two) times daily between meals.    Marland Kitchen FLUoxetine (PROZAC) 10 MG tablet Take 10 mg by mouth daily.    Marland Kitchen loratadine (CLARITIN) 10 MG tablet Take 10 mg by mouth daily.    . Melatonin 3 MG CAPS Take 3 mg by mouth at bedtime.    . Omega-3 Fatty Acids (FISH OIL) 1000 MG CAPS Take 1,000 mg by mouth daily.    Marland Kitchen omeprazole (PRILOSEC) 20 MG capsule Take 20 mg by mouth daily.    . polyethylene glycol (MIRALAX / GLYCOLAX) packet Take 17 g by mouth every other day. 14 each 0  . Rivaroxaban (XARELTO) 15 MG TABS tablet Take 1 tablet (15 mg total) by mouth daily. 30 tablet 6   No current facility-administered medications for this visit.     Past Medical History  Diagnosis Date  . Memory deficit 10/17/2012  . Abnormality of gait 10/17/2012  . Urinary incontinence   . Hypertension   . Dyslipidemia   . Spinal stenosis   . Hemiparesis and alteration of sensations as late effects of stroke (HCC) 06/24/2014  . Anemia   . Stroke (HCC) 2000's    mild left hemiparesis  . Lumbosacral spondylosis   . Arthritis     "knees" (12/12/2014)  . Spinal stenosis, lumbar   . Alzheimer disease     ROS:   All systems reviewed and negative except as noted in the HPI.   Past Surgical History  Procedure Laterality Date  . Tubal  ligation Bilateral   . Cataract extraction w/ intraocular lens  implant, bilateral Bilateral   . Ep implantable device N/A 09/08/2014    Procedure: Loop Recorder Insertion;  Surgeon: Marinus Maw, MD;  Location: [facility];  Service: Cardiovascular;  Laterality: N/A;     Family History  Problem Relation Age of Onset  . Parkinsonism Brother   . Dementia Brother   . Heart disease Brother      Social History   Social History  . Marital Status: Divorced    Spouse Name: N/A  . Number of Children: 2  . Years of Education: 16   Occupational History  . Retired Charity fundraiser    Social History Main Topics  . Smoking status: Never Smoker   . Smokeless tobacco: Never Used  . Alcohol Use: 4.2 oz/week    7 Glasses of wine per week     Comment: 1 glass of wine per day  . Drug Use: No  . Sexual Activity: Not on file   Other Topics Concern  . Not on file   Social History Narrative   Lives alone in one story home.  Has 2 children.  Retired Charity fundraiser.       BP 102/54 mmHg  Pulse 40  Ht 5\' 3"  (1.6 m)  Wt 115 lb 9.6 oz (52.436 kg)  BMI 20.48 kg/m2  Physical Exam:  stable diskempt appearing elderly woman, NAD HEENT: Unremarkable Neck:  6 cm JVD, no thyromegally Back:  No CVA tenderness Lungs:  Clear with no wheezes HEART:  Regular rate rhythm, no murmurs, no rubs, no clicks Abd:  soft, positive bowel sounds, no organomegally, no rebound, no guarding Ext:  2 plus pulses, no edema, no cyanosis, no clubbing Skin:  No rashes no nodules Neuro:  CN II through XII intact, motor grossly intact  DEVICE  Normal device function.  See PaceArt for details. ILR interogation demonstrates both atrial fib and pauses due to sinus node dysfunction of over 4 seconds during the daytime.  Assess/Plan: 1. Cryptogenic stroke - she appears to be having atrial fib on ILR interogation. I have recommended she start Xarelto 15 mg daily. 2. HTN - her blood pressure is well controlled. 3. PAF - she is  asymptomatic. No AA drug therapy 4. Sinus node dysfunction - we discussed PPM insertion. As she is asymptomatic, and in light of her sedentary lifestyle and because she has worsening dementia, I am inclined not to recommend proceeding with PPM insertion and I have discussed this with her daughter who is with her today and in agreement.  Leonia ReevesGregg Azadeh Hyder,M.D.

## 2015-07-22 ENCOUNTER — Telehealth: Payer: Self-pay | Admitting: Internal Medicine

## 2015-07-22 NOTE — Telephone Encounter (Signed)
It has to be at lest one week post injection and her back injection is 07/24/15.  Start Xarelto 08/03/15 per Dr Ladona Ridgelaylor.  Angela aware.

## 2015-07-22 NOTE — Telephone Encounter (Signed)
New message      ? About when to start xarelto.  Their order says start it on 08-03-15 but the daughter says that is incorrect.  Daughter says patient is to start medication sooner.  Please call

## 2015-08-04 ENCOUNTER — Ambulatory Visit (INDEPENDENT_AMBULATORY_CARE_PROVIDER_SITE_OTHER): Payer: Medicare Other | Admitting: *Deleted

## 2015-08-04 DIAGNOSIS — I639 Cerebral infarction, unspecified: Secondary | ICD-10-CM

## 2015-08-05 NOTE — Progress Notes (Signed)
Carelink Summary Report / Loop Recorder 

## 2015-08-07 ENCOUNTER — Encounter: Payer: Self-pay | Admitting: Internal Medicine

## 2015-08-07 ENCOUNTER — Encounter: Payer: Self-pay | Admitting: *Deleted

## 2015-08-07 ENCOUNTER — Ambulatory Visit (INDEPENDENT_AMBULATORY_CARE_PROVIDER_SITE_OTHER): Payer: Medicare Other | Admitting: Internal Medicine

## 2015-08-07 VITALS — BP 100/52 | HR 39 | Ht 63.0 in | Wt 114.0 lb

## 2015-08-07 DIAGNOSIS — I495 Sick sinus syndrome: Secondary | ICD-10-CM | POA: Diagnosis not present

## 2015-08-07 DIAGNOSIS — I639 Cerebral infarction, unspecified: Secondary | ICD-10-CM

## 2015-08-07 LAB — CUP PACEART INCLINIC DEVICE CHECK: Date Time Interrogation Session: 20170421163805

## 2015-08-07 NOTE — Progress Notes (Signed)
HPI Tracey [name] returns today for ongoing evaluation of her cryptogenic stroke, s/p ILR insertion. She has dementia and this appears to be worsening. She has had weak periods over the past few months. No frank syncope. No new neuro deficits. Her memory is poor. She denies palpitations. Her main problem today is being turned down to undergo spinal injection. She was at the surgery center and found to be bradycardic (she always is) and sent away. She has terrible back pain.  Allergies  Allergen Reactions  . Alendronate Sodium     unknown  . Namenda [Memantine Hcl] Other (See Comments)    halucinations  . Simvastatin     unknown     Current Outpatient Prescriptions  Medication Sig Dispense Refill  . acetaminophen (TYLENOL) 500 MG tablet Take 500 mg by mouth every 6 (six) hours as needed.    Marland Kitchen aspirin 81 MG tablet Take 81 mg by mouth daily.    . Clobetasol Prop Emollient Base (CLOBETASOL PROPIONATE E) 0.05 % emollient cream Apply 1 application topically daily.    . Diclofenac Sodium (PENNSAID TD) Place 2 application onto the skin daily.    . feeding supplement (BOOST HIGH PROTEIN) LIQD Take 1 Container by mouth 2 (two) times daily between meals.    Marland Kitchen FLUoxetine (PROZAC) 10 MG tablet Take 10 mg by mouth daily.    Marland Kitchen loratadine (CLARITIN) 10 MG tablet Take 10 mg by mouth daily.    . Melatonin 3 MG CAPS Take 3 mg by mouth at bedtime.    . Omega-3 Fatty Acids (FISH OIL) 1000 MG CAPS Take 1,000 mg by mouth daily.    Marland Kitchen omeprazole (PRILOSEC) 20 MG capsule Take 20 mg by mouth daily.    . polyethylene glycol (MIRALAX / GLYCOLAX) packet Take 17 g by mouth every other day. 14 each 0  . Rivaroxaban (XARELTO) 15 MG TABS tablet Take 1 tablet (15 mg total) by mouth daily. 30 tablet 6   No current facility-administered medications for this visit.     Past Medical History  Diagnosis Date  . Memory deficit 10/17/2012  . Abnormality of gait 10/17/2012  . Urinary incontinence   . Hypertension   .  Dyslipidemia   . Spinal stenosis   . Hemiparesis and alteration of sensations as late effects of stroke (HCC) 06/24/2014  . Anemia   . Stroke (HCC) 2000's    mild left hemiparesis  . Lumbosacral spondylosis   . Arthritis     "knees" (12/12/2014)  . Spinal stenosis, lumbar   . Alzheimer disease     ROS:   All systems reviewed and negative except as noted in the HPI.   Past Surgical History  Procedure Laterality Date  . Tubal ligation Bilateral   . Cataract extraction w/ intraocular lens  implant, bilateral Bilateral   . Ep implantable device N/A 09/08/2014    Procedure: Loop Recorder Insertion;  Surgeon: Marinus Maw, MD;  Location: [facility];  Service: Cardiovascular;  Laterality: N/A;     Family History  Problem Relation Age of Onset  . Parkinsonism Brother   . Dementia Brother   . Heart disease Brother      Social History   Social History  . Marital Status: Divorced    Spouse Name: N/A  . Number of Children: 2  . Years of Education: 16   Occupational History  . Retired Charity fundraiser    Social History Main Topics  . Smoking status: Never Smoker   .  Smokeless tobacco: Never Used  . Alcohol Use: 4.2 oz/week    7 Glasses of wine per week     Comment: 1 glass of wine per day  . Drug Use: No  . Sexual Activity: Not on file   Other Topics Concern  . Not on file   Social History Narrative   Lives alone in one story home.  Has 2 children.  Retired Charity fundraiserN.      Pulse - 40, BP - 130/82, R - 16  Physical Exam:  stable diskempt appearing elderly woman, NAD HEENT: Unremarkable Neck:  6 cm JVD, no thyromegally Back:  No CVA tenderness Lungs:  Clear with no wheezes HEART:  Regular rate rhythm, no murmurs, no rubs, no clicks Abd:  soft, positive bowel sounds, no organomegally, no rebound, no guarding Ext:  2 plus pulses, no edema, no cyanosis, no clubbing Skin:  No rashes no nodules Neuro:  CN II through XII intact, motor grossly intact  DEVICE  Normal device  function.  See PaceArt for details. ILR interogation demonstrates both atrial fib and pauses due to sinus node dysfunction of over 4 seconds during the daytime.  Assess/Plan: 1. Cryptogenic stroke - she appears to be having atrial fib on ILR interogation. I have recommended she start Xarelto 15 mg daily. This can be held until she has had her spinal injection 2. HTN - her blood pressure is well controlled. 3. PAF - she is asymptomatic. No AA drug therapy 4. Sinus node dysfunction - we discussed PPM insertion. As she is asymptomatic, and in light of her sedentary lifestyle and because she has worsening dementia, I am inclined not to recommend proceeding with PPM insertion and I have discussed this with her daughter who is with her today and in agreement.  Tracey [name],M.D.

## 2015-08-07 NOTE — Patient Instructions (Signed)

## 2015-08-10 LAB — CUP PACEART REMOTE DEVICE CHECK: Date Time Interrogation Session: 20170217210806

## 2015-08-10 NOTE — Progress Notes (Signed)
[facility] summary report received. Battery status OK. Normal device function. No new symptom episodes or tachy episodes. 28 pause- previously addressed. 537 brady- min vent rate <30 BPM. Per most recent GT note, declines PPM at this time. 37 AF 0.3% +Xarelto. Monthly summary reports and ROV/PRN

## 2015-08-11 ENCOUNTER — Telehealth: Payer: Self-pay | Admitting: Internal Medicine

## 2015-08-11 NOTE — Telephone Encounter (Signed)
New Message:    She would like the name of the new blood thinner pt was started on last week? Also wants to know if she can stop it for 5 days for lumbar epidural injection? Please fax it to [phone] UJW:[id number]:Amanda

## 2015-08-12 NOTE — Telephone Encounter (Signed)
Note faxed.

## 2015-08-13 ENCOUNTER — Emergency Department (HOSPITAL_COMMUNITY): Payer: Medicare Other

## 2015-08-13 ENCOUNTER — Encounter (HOSPITAL_COMMUNITY): Payer: Self-pay

## 2015-08-13 ENCOUNTER — Inpatient Hospital Stay (HOSPITAL_COMMUNITY)
Admission: EM | Admit: 2015-08-13 | Discharge: 2015-08-17 | DRG: 280 | Disposition: A | Discharge status: Nursing Home (Includes ICF) | Payer: Medicare Other | Attending: Internal Medicine | Admitting: Internal Medicine

## 2015-08-13 DIAGNOSIS — E860 Dehydration: Secondary | ICD-10-CM | POA: Diagnosis present

## 2015-08-13 DIAGNOSIS — R001 Bradycardia, unspecified: Secondary | ICD-10-CM | POA: Diagnosis present

## 2015-08-13 DIAGNOSIS — Z888 Allergy status to other drugs, medicaments and biological substances status: Secondary | ICD-10-CM

## 2015-08-13 DIAGNOSIS — M549 Dorsalgia, unspecified: Secondary | ICD-10-CM | POA: Diagnosis present

## 2015-08-13 DIAGNOSIS — Z9841 Cataract extraction status, right eye: Secondary | ICD-10-CM

## 2015-08-13 DIAGNOSIS — G8929 Other chronic pain: Secondary | ICD-10-CM | POA: Diagnosis present

## 2015-08-13 DIAGNOSIS — R110 Nausea: Secondary | ICD-10-CM | POA: Diagnosis present

## 2015-08-13 DIAGNOSIS — Z79899 Other long term (current) drug therapy: Secondary | ICD-10-CM

## 2015-08-13 DIAGNOSIS — F0280 Dementia in other diseases classified elsewhere without behavioral disturbance: Secondary | ICD-10-CM | POA: Diagnosis present

## 2015-08-13 DIAGNOSIS — R740 Nonspecific elevation of levels of transaminase and lactic acid dehydrogenase [LDH]: Secondary | ICD-10-CM | POA: Diagnosis present

## 2015-08-13 DIAGNOSIS — R7401 Elevation of levels of liver transaminase levels: Secondary | ICD-10-CM | POA: Diagnosis present

## 2015-08-13 DIAGNOSIS — I420 Dilated cardiomyopathy: Secondary | ICD-10-CM | POA: Diagnosis present

## 2015-08-13 DIAGNOSIS — Z9842 Cataract extraction status, left eye: Secondary | ICD-10-CM

## 2015-08-13 DIAGNOSIS — I480 Paroxysmal atrial fibrillation: Secondary | ICD-10-CM | POA: Diagnosis present

## 2015-08-13 DIAGNOSIS — Z79891 Long term (current) use of opiate analgesic: Secondary | ICD-10-CM

## 2015-08-13 DIAGNOSIS — Z66 Do not resuscitate: Secondary | ICD-10-CM | POA: Diagnosis present

## 2015-08-13 DIAGNOSIS — N179 Acute kidney failure, unspecified: Secondary | ICD-10-CM | POA: Diagnosis present

## 2015-08-13 DIAGNOSIS — I444 Left anterior fascicular block: Secondary | ICD-10-CM | POA: Diagnosis present

## 2015-08-13 DIAGNOSIS — Z8249 Family history of ischemic heart disease and other diseases of the circulatory system: Secondary | ICD-10-CM

## 2015-08-13 DIAGNOSIS — I5041 Acute combined systolic (congestive) and diastolic (congestive) heart failure: Secondary | ICD-10-CM | POA: Diagnosis present

## 2015-08-13 DIAGNOSIS — R4182 Altered mental status, unspecified: Secondary | ICD-10-CM | POA: Diagnosis present

## 2015-08-13 DIAGNOSIS — R443 Hallucinations, unspecified: Secondary | ICD-10-CM | POA: Diagnosis present

## 2015-08-13 DIAGNOSIS — I259 Chronic ischemic heart disease, unspecified: Secondary | ICD-10-CM | POA: Diagnosis present

## 2015-08-13 DIAGNOSIS — I130 Hypertensive heart and chronic kidney disease with heart failure and stage 1 through stage 4 chronic kidney disease, or unspecified chronic kidney disease: Secondary | ICD-10-CM | POA: Diagnosis present

## 2015-08-13 DIAGNOSIS — R112 Nausea with vomiting, unspecified: Secondary | ICD-10-CM | POA: Diagnosis not present

## 2015-08-13 DIAGNOSIS — J449 Chronic obstructive pulmonary disease, unspecified: Secondary | ICD-10-CM | POA: Diagnosis present

## 2015-08-13 DIAGNOSIS — I214 Non-ST elevation (NSTEMI) myocardial infarction: Secondary | ICD-10-CM | POA: Diagnosis not present

## 2015-08-13 DIAGNOSIS — Z8673 Personal history of transient ischemic attack (TIA), and cerebral infarction without residual deficits: Secondary | ICD-10-CM

## 2015-08-13 DIAGNOSIS — N390 Urinary tract infection, site not specified: Secondary | ICD-10-CM | POA: Diagnosis present

## 2015-08-13 DIAGNOSIS — W19XXXA Unspecified fall, initial encounter: Secondary | ICD-10-CM | POA: Diagnosis present

## 2015-08-13 DIAGNOSIS — Z961 Presence of intraocular lens: Secondary | ICD-10-CM | POA: Diagnosis present

## 2015-08-13 DIAGNOSIS — R7989 Other specified abnormal findings of blood chemistry: Secondary | ICD-10-CM | POA: Diagnosis present

## 2015-08-13 DIAGNOSIS — Z7901 Long term (current) use of anticoagulants: Secondary | ICD-10-CM

## 2015-08-13 DIAGNOSIS — E785 Hyperlipidemia, unspecified: Secondary | ICD-10-CM | POA: Diagnosis present

## 2015-08-13 DIAGNOSIS — Z820 Family history of epilepsy and other diseases of the nervous system: Secondary | ICD-10-CM

## 2015-08-13 DIAGNOSIS — N183 Chronic kidney disease, stage 3 (moderate): Secondary | ICD-10-CM | POA: Diagnosis present

## 2015-08-13 DIAGNOSIS — G309 Alzheimer's disease, unspecified: Secondary | ICD-10-CM | POA: Diagnosis present

## 2015-08-13 LAB — COMPREHENSIVE METABOLIC PANEL
ALBUMIN: 4 g/dL (ref 3.5–5.0)
ALT: 245 U/L — AB (ref 14–54)
AST: 205 U/L — AB (ref 15–41)
Alkaline Phosphatase: 95 U/L (ref 38–126)
Anion gap: 11 (ref 5–15)
BUN: 43 mg/dL — AB (ref 6–20)
CHLORIDE: 103 mmol/L (ref 101–111)
CO2: 22 mmol/L (ref 22–32)
Calcium: 9.2 mg/dL (ref 8.9–10.3)
Creatinine, Ser: 1.48 mg/dL — ABNORMAL HIGH (ref 0.44–1.00)
GFR calc Af Amer: 37 mL/min — ABNORMAL LOW (ref >60)
GFR calc non Af Amer: 32 mL/min — ABNORMAL LOW (ref >60)
Glucose, Bld: 150 mg/dL — ABNORMAL HIGH (ref 65–99)
Potassium: 3.8 mmol/L (ref 3.5–5.1)
SODIUM: 136 mmol/L (ref 135–145)
Total Bilirubin: 1.2 mg/dL (ref 0.3–1.2)
Total Protein: 6.5 g/dL (ref 6.5–8.1)

## 2015-08-13 LAB — TROPONIN I: Troponin I: 0.8 ng/mL (ref <0.031)

## 2015-08-13 LAB — CBC
HCT: 33.3 % — ABNORMAL LOW (ref 36.0–46.0)
Hemoglobin: 11 g/dL — ABNORMAL LOW (ref 12.0–15.0)
MCH: 28.9 pg (ref 26.0–34.0)
MCHC: 33 g/dL (ref 30.0–36.0)
MCV: 87.4 fL (ref 78.0–100.0)
PLATELETS: 228 10*3/uL (ref 150–400)
RBC: 3.81 MIL/uL — AB (ref 3.87–5.11)
RDW: 15.1 % (ref 11.5–15.5)
WBC: 10.2 10*3/uL (ref 4.0–10.5)

## 2015-08-13 LAB — PROTIME-INR
INR: 2.01 — AB (ref 0.00–1.49)
Prothrombin Time: 22.6 seconds — ABNORMAL HIGH (ref 11.6–15.2)

## 2015-08-13 MED ORDER — BOOST HIGH PROTEIN PO LIQD
1.0000 | Freq: Two times a day (BID) | ORAL | Status: DC
  Administered 2015-08-14: 237 mL via ORAL
  Filled 2015-08-13 (×4): qty 237

## 2015-08-13 MED ORDER — SODIUM CHLORIDE 0.9 % IV SOLN
INTRAVENOUS | Status: DC
  Administered 2015-08-13 – 2015-08-14 (×3): via INTRAVENOUS

## 2015-08-13 MED ORDER — RIVAROXABAN 15 MG PO TABS
15.0000 mg | ORAL_TABLET | Freq: Every day | ORAL | Status: DC
  Administered 2015-08-14 – 2015-08-17 (×4): 15 mg via ORAL
  Filled 2015-08-13 (×4): qty 1

## 2015-08-13 MED ORDER — LORATADINE 10 MG PO TABS
10.0000 mg | ORAL_TABLET | Freq: Every day | ORAL | Status: DC
  Administered 2015-08-14 – 2015-08-17 (×4): 10 mg via ORAL
  Filled 2015-08-13 (×4): qty 1

## 2015-08-13 MED ORDER — PANTOPRAZOLE SODIUM 40 MG PO TBEC: 40.0000 mg | DELAYED_RELEASE_TABLET | Freq: Every day | ORAL | Status: DC

## 2015-08-13 MED ORDER — ASPIRIN 81 MG PO CHEW
324.0000 mg | CHEWABLE_TABLET | Freq: Once | ORAL | Status: AC
  Administered 2015-08-13: 324 mg via ORAL
  Filled 2015-08-13: qty 4

## 2015-08-13 MED ORDER — MELATONIN 3 MG PO CAPS: 3.0000 mg | ORAL_CAPSULE | Freq: Every day | ORAL | Status: DC

## 2015-08-13 MED ORDER — FLUOXETINE HCL 20 MG PO TABS
10.0000 mg | ORAL_TABLET | Freq: Every day | ORAL | Status: DC
  Filled 2015-08-13: qty 1

## 2015-08-13 MED ORDER — POLYETHYLENE GLYCOL 3350 17 G PO PACK
17.0000 g | PACK | ORAL | Status: DC
  Administered 2015-08-16: 17 g via ORAL
  Filled 2015-08-13 (×3): qty 1

## 2015-08-13 NOTE — ED Notes (Signed)
Bed: RESB Expected date:  Expected time:  Means of arrival:  Comments: Fall, knee pain

## 2015-08-13 NOTE — ED Provider Notes (Signed)
CSN: [id number]     Arrival date & time 08/13/15  2118 History   First MD Initiated Contact with Patient 08/13/15 2127     Chief Complaint  Patient presents with  . Fall   PT FELL AROUND 1700 TODAY.  SHE FEELS LIKE THERE IS NOTHING WRONG WITH HER AND DID NOT WANT TO COME AND HAS NO COMPLAINTS.  THE DAUGHTER SAID THAT SHE HAS NOT BEEN EATING AND DRINKING AS PER USUAL.   (Consider location/radiation/quality/duration/timing/severity/associated sxs/prior Treatment) Patient is a 82 y.o. female presenting with fall. The history is provided by the patient. The history is limited by a language barrier.  Fall The current episode started 3 to 5 hours ago.    Past Medical History  Diagnosis Date  . Memory deficit 10/17/2012  . Abnormality of gait 10/17/2012  . Urinary incontinence   . Hypertension   . Dyslipidemia   . Spinal stenosis   . Hemiparesis and alteration of sensations as late effects of stroke (HCC) 06/24/2014  . Anemia   . Stroke (HCC) 2000's    mild left hemiparesis  . Lumbosacral spondylosis   . Arthritis     "knees" (12/12/2014)  . Spinal stenosis, lumbar   . Alzheimer disease    Past Surgical History  Procedure Laterality Date  . Tubal ligation Bilateral   . Cataract extraction w/ intraocular lens  implant, bilateral Bilateral   . Ep implantable device N/A 09/08/2014    Procedure: Loop Recorder Insertion;  Surgeon: Marinus Maw, MD;  Location: [facility];  Service: Cardiovascular;  Laterality: N/A;   Family History  Problem Relation Age of Onset  . Parkinsonism Brother   . Dementia Brother   . Heart disease Brother    Social History  Substance Use Topics  . Smoking status: Never Smoker   . Smokeless tobacco: Never Used  . Alcohol Use: 4.2 oz/week    7 Glasses of wine per week     Comment: 1 glass of wine per day   OB History    No data available     Review of Systems  All other systems reviewed and are negative.     Allergies  Alendronate sodium;  Namenda; and Simvastatin  Home Medications   Prior to Admission medications   Medication Sig Start Date End Date Taking? Authorizing Provider  acetaminophen (TYLENOL) 500 MG tablet Take 500 mg by mouth 2 (two) times daily. May also give q6hprn   Yes Historical Provider, MD  acetaminophen-codeine (TYLENOL #2) 300-15 MG tablet Take 1 tablet by mouth daily.   Yes Historical Provider, MD  clobetasol (TEMOVATE) 0.05 % external solution Apply 1 application topically daily. Use mon, tue, wed, thur, fri for itching   Yes Historical Provider, MD  feeding supplement (BOOST HIGH PROTEIN) LIQD Take 1 Container by mouth 2 (two) times daily between meals.   Yes Historical Provider, MD  FLUoxetine (PROZAC) 10 MG tablet Take 10 mg by mouth daily.   Yes Historical Provider, MD  hydrocortisone cream 1 % Apply 1 application topically See admin instructions. Use mon, tue, wed, thu, fri   Yes Historical Provider, MD  loratadine (CLARITIN) 10 MG tablet Take 10 mg by mouth daily.   Yes Historical Provider, MD  Melatonin 3 MG CAPS Take 3 mg by mouth at bedtime.   Yes Historical Provider, MD  Omega-3 Fatty Acids (FISH OIL) 1000 MG CAPS Take 1,000 mg by mouth daily.   Yes Historical Provider, MD  omeprazole (PRILOSEC) 20 MG capsule  Take 20 mg by mouth daily.   Yes Historical Provider, MD  PENNSAID 2 % SOLN Place 2 application onto the skin 2 (two) times daily. 08/11/15  Yes Historical Provider, MD  polyethylene glycol (MIRALAX / GLYCOLAX) packet Take 17 g by mouth every other day. 12/13/14  Yes Shanker Levora Dredge, MD  promethazine (PHENERGAN) 25 MG tablet Take 25 mg by mouth every 8 (eight) hours as needed for nausea or vomiting.   Yes Historical Provider, MD  Rivaroxaban (XARELTO) 15 MG TABS tablet Take 1 tablet (15 mg total) by mouth daily. 07/21/15  Yes Marinus Maw, MD   BP 122/60 mmHg  Pulse 65  Temp(Src) 97.8 F (36.6 C) (Oral)  Resp 18  SpO2 100% Physical Exam  Constitutional: She is oriented to person, place,  and time. She appears well-developed and well-nourished.  HENT:  Head: Normocephalic and atraumatic.  Right Ear: External ear normal.  Left Ear: External ear normal.  Eyes: Conjunctivae and EOM are normal. Pupils are equal, round, and reactive to light.  Neck: Normal range of motion. Neck supple.  Cardiovascular: Normal rate, regular rhythm, normal heart sounds and intact distal pulses.   Pulmonary/Chest: Effort normal and breath sounds normal.  Abdominal: Soft. Bowel sounds are normal.  Musculoskeletal: Normal range of motion.  Neurological: She is alert and oriented to person, place, and time.  Skin: Skin is warm and dry.  Psychiatric: She has a normal mood and affect.  Nursing note and vitals reviewed.   ED Course  Procedures (including critical care time) Labs Review Labs Reviewed  CBC - Abnormal; Notable for the following:    RBC 3.81 (*)    Hemoglobin 11.0 (*)    HCT 33.3 (*)    All other components within normal limits  COMPREHENSIVE METABOLIC PANEL - Abnormal; Notable for the following:    Glucose, Bld 150 (*)    BUN 43 (*)    Creatinine, Ser 1.48 (*)    AST 205 (*)    ALT 245 (*)    GFR calc non Af Amer 32 (*)    GFR calc Af Amer 37 (*)    All other components within normal limits  PROTIME-INR - Abnormal; Notable for the following:    Prothrombin Time 22.6 (*)    INR 2.01 (*)    All other components within normal limits  TROPONIN I - Abnormal; Notable for the following:    Troponin I 0.80 (*)    All other components within normal limits  URINALYSIS, ROUTINE W REFLEX MICROSCOPIC (NOT AT [facility])    Imaging Review Dg Chest 2 View  08/13/2015  CLINICAL DATA:  Unwitnessed fall today 5 p.m. Altered mental status. History of hypertension, stroke. EXAM: CHEST  2 VIEW COMPARISON:  Chest radiograph January 03, 2015 FINDINGS: The cardiac silhouette is mildly enlarged, similar. Mediastinal silhouette is unremarkable. Mild chronic interstitial changes and increased lung  volumes without pleural effusion or focal consolidation. Biapical pleural thickening is similar. No pneumothorax. Loop monitor projects over lower chest. Soft tissue planes and included osseous structures are nonsuspicious. IMPRESSION: Stable mild cardiomegaly and findings of COPD. No superimposed acute pulmonary process. Electronically Signed   By: Awilda Metro M.D.   On: 08/13/2015 22:07   Ct Head Wo Contrast  08/13/2015  CLINICAL DATA:  Unwitnessed fall today at 5 p.m. Altered mental status. Knee pain. History of Alzheimer's disease, hypertension, dyslipidemia. EXAM: CT HEAD WITHOUT CONTRAST TECHNIQUE: Contiguous axial images were obtained from the base of the skull through  the vertex without intravenous contrast. COMPARISON:  MRI head January 03, 2015 FINDINGS: INTRACRANIAL CONTENTS: Moderate to severe ventriculomegaly on the basis of global parenchymal brain volume loss. No intraparenchymal hemorrhage, mass effect nor midline shift. Confluent supratentorial white matter hypodensities. Bifrontal encephalomalacia. Old LEFT thalamus lacunar infarct. No acute large vascular territory infarcts. No abnormal extra-axial fluid collections. Basal cisterns are patent. Moderate calcific atherosclerosis of the carotid siphons. ORBITS: The included ocular globes and orbital contents are non-suspicious. SINUSES: Small RIGHT sphenoid mucosal retention cyst without paranasal sinus air-fluid levels. Mild ethmoid mucosal thickening. Mastoid air cells are well aerated. Soft tissue within the external auditory canals compatible with cerumen. SKULL/SOFT TISSUES: No skull fracture. No significant soft tissue swelling. 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 IMPRESSION: No acute intracranial process. Stable moderate to severe global brain atrophy and severe chronic small vessel ischemic disease. Old bifrontal infarcts within MCA territories and old LEFT thalamus lacunar infarct. Electronically Signed    By: Awilda Metroourtnay  Bloomer M.D.   On: 08/13/2015 22:14   I have personally reviewed and evaluated these images and lab results as part of my medical decision-making.   EKG Interpretation None    (muse not working).  Nsr.  Hr 62.  no st or t wave changes  MDM  I SPOKE WITH PT'S DAUGHTER WHO SAID THAT SHE HAS BEEN FEELING NAUSEOUS FOR THE PAST FEW DAYS, BUT HAS NOT C/O CP.    PT D/W HOSPITALIST FOR ADMISSION WHO WILL ADMIT PT.  HE ASKED THAT I CONSULT THE CARDIOLOGIST.  PT D/W DR. Tresa EndoKELLY (CARDIOLOGY) WHO SAID TO TREND THE TROPONIN AND CALL IN THE AM IF THERE IS SIGNIFICANT CHANGE. Final diagnoses:  NSTEMI (non-ST elevated myocardial infarction) (HCC)  Dehydration  Acute kidney injury (HCC)        Jacalyn LefevreJulie Lekita Kerekes, MD 08/14/15 2328

## 2015-08-13 NOTE — ED Notes (Signed)
According to EMS, pt experienced unwitnessed fall at 5pm today. Pts family states pt has not been eating and drinking per her usual. Pt arrives A+OX4, speaking in complete sentences, w/ c/o bilateral knee pain.

## 2015-08-13 NOTE — ED Notes (Signed)
Patient transported to X-ray 

## 2015-08-13 NOTE — ED Notes (Signed)
Daughter- [phone], Lynden AngVicky

## 2015-08-13 NOTE — ED Notes (Signed)
Notified MD of critical troponin 0.80

## 2015-08-14 ENCOUNTER — Observation Stay (HOSPITAL_BASED_OUTPATIENT_CLINIC_OR_DEPARTMENT_OTHER): Payer: Medicare Other

## 2015-08-14 ENCOUNTER — Encounter (HOSPITAL_COMMUNITY): Payer: Self-pay | Admitting: Cardiology

## 2015-08-14 DIAGNOSIS — N179 Acute kidney failure, unspecified: Secondary | ICD-10-CM | POA: Diagnosis present

## 2015-08-14 DIAGNOSIS — I5041 Acute combined systolic (congestive) and diastolic (congestive) heart failure: Secondary | ICD-10-CM | POA: Diagnosis present

## 2015-08-14 DIAGNOSIS — Z820 Family history of epilepsy and other diseases of the nervous system: Secondary | ICD-10-CM | POA: Diagnosis not present

## 2015-08-14 DIAGNOSIS — Z961 Presence of intraocular lens: Secondary | ICD-10-CM | POA: Diagnosis present

## 2015-08-14 DIAGNOSIS — Z79899 Other long term (current) drug therapy: Secondary | ICD-10-CM | POA: Diagnosis not present

## 2015-08-14 DIAGNOSIS — R4182 Altered mental status, unspecified: Secondary | ICD-10-CM | POA: Diagnosis present

## 2015-08-14 DIAGNOSIS — I480 Paroxysmal atrial fibrillation: Secondary | ICD-10-CM | POA: Diagnosis present

## 2015-08-14 DIAGNOSIS — N390 Urinary tract infection, site not specified: Secondary | ICD-10-CM | POA: Diagnosis present

## 2015-08-14 DIAGNOSIS — G309 Alzheimer's disease, unspecified: Secondary | ICD-10-CM | POA: Diagnosis present

## 2015-08-14 DIAGNOSIS — J449 Chronic obstructive pulmonary disease, unspecified: Secondary | ICD-10-CM | POA: Diagnosis present

## 2015-08-14 DIAGNOSIS — Z9841 Cataract extraction status, right eye: Secondary | ICD-10-CM | POA: Diagnosis not present

## 2015-08-14 DIAGNOSIS — Z8249 Family history of ischemic heart disease and other diseases of the circulatory system: Secondary | ICD-10-CM | POA: Diagnosis not present

## 2015-08-14 DIAGNOSIS — M549 Dorsalgia, unspecified: Secondary | ICD-10-CM | POA: Diagnosis present

## 2015-08-14 DIAGNOSIS — R7989 Other specified abnormal findings of blood chemistry: Secondary | ICD-10-CM | POA: Diagnosis present

## 2015-08-14 DIAGNOSIS — R443 Hallucinations, unspecified: Secondary | ICD-10-CM | POA: Diagnosis present

## 2015-08-14 DIAGNOSIS — R110 Nausea: Secondary | ICD-10-CM | POA: Diagnosis not present

## 2015-08-14 DIAGNOSIS — Z9842 Cataract extraction status, left eye: Secondary | ICD-10-CM | POA: Diagnosis not present

## 2015-08-14 DIAGNOSIS — R079 Chest pain, unspecified: Secondary | ICD-10-CM | POA: Diagnosis not present

## 2015-08-14 DIAGNOSIS — R001 Bradycardia, unspecified: Secondary | ICD-10-CM | POA: Diagnosis present

## 2015-08-14 DIAGNOSIS — I444 Left anterior fascicular block: Secondary | ICD-10-CM | POA: Diagnosis present

## 2015-08-14 DIAGNOSIS — W19XXXA Unspecified fall, initial encounter: Secondary | ICD-10-CM | POA: Diagnosis present

## 2015-08-14 DIAGNOSIS — Z888 Allergy status to other drugs, medicaments and biological substances status: Secondary | ICD-10-CM | POA: Diagnosis not present

## 2015-08-14 DIAGNOSIS — R740 Nonspecific elevation of levels of transaminase and lactic acid dehydrogenase [LDH]: Secondary | ICD-10-CM | POA: Diagnosis present

## 2015-08-14 DIAGNOSIS — N183 Chronic kidney disease, stage 3 (moderate): Secondary | ICD-10-CM | POA: Diagnosis present

## 2015-08-14 DIAGNOSIS — I420 Dilated cardiomyopathy: Secondary | ICD-10-CM

## 2015-08-14 DIAGNOSIS — F0280 Dementia in other diseases classified elsewhere without behavioral disturbance: Secondary | ICD-10-CM | POA: Diagnosis present

## 2015-08-14 DIAGNOSIS — I214 Non-ST elevation (NSTEMI) myocardial infarction: Secondary | ICD-10-CM | POA: Diagnosis not present

## 2015-08-14 DIAGNOSIS — Z79891 Long term (current) use of opiate analgesic: Secondary | ICD-10-CM | POA: Diagnosis not present

## 2015-08-14 DIAGNOSIS — Z8673 Personal history of transient ischemic attack (TIA), and cerebral infarction without residual deficits: Secondary | ICD-10-CM | POA: Diagnosis not present

## 2015-08-14 DIAGNOSIS — I130 Hypertensive heart and chronic kidney disease with heart failure and stage 1 through stage 4 chronic kidney disease, or unspecified chronic kidney disease: Secondary | ICD-10-CM | POA: Diagnosis present

## 2015-08-14 DIAGNOSIS — Z7901 Long term (current) use of anticoagulants: Secondary | ICD-10-CM | POA: Diagnosis not present

## 2015-08-14 DIAGNOSIS — E860 Dehydration: Secondary | ICD-10-CM | POA: Diagnosis present

## 2015-08-14 DIAGNOSIS — R112 Nausea with vomiting, unspecified: Secondary | ICD-10-CM | POA: Diagnosis present

## 2015-08-14 DIAGNOSIS — G8929 Other chronic pain: Secondary | ICD-10-CM | POA: Diagnosis present

## 2015-08-14 DIAGNOSIS — Z66 Do not resuscitate: Secondary | ICD-10-CM | POA: Diagnosis present

## 2015-08-14 DIAGNOSIS — E785 Hyperlipidemia, unspecified: Secondary | ICD-10-CM | POA: Diagnosis present

## 2015-08-14 DIAGNOSIS — I259 Chronic ischemic heart disease, unspecified: Secondary | ICD-10-CM | POA: Diagnosis present

## 2015-08-14 LAB — TROPONIN I
TROPONIN I: 0.63 ng/mL — AB (ref <0.031)
TROPONIN I: 0.48 ng/mL — AB (ref <0.031)

## 2015-08-14 LAB — URINE MICROSCOPIC-ADD ON

## 2015-08-14 LAB — ECHOCARDIOGRAM COMPLETE
Height: 65 in
Weight: 1791.9 oz

## 2015-08-14 LAB — URINALYSIS, ROUTINE W REFLEX MICROSCOPIC
Bilirubin Urine: NEGATIVE
Glucose, UA: NEGATIVE mg/dL
KETONES UR: NEGATIVE mg/dL
NITRITE: POSITIVE — AB
PH: 5.5 (ref 5.0–8.0)
Protein, ur: 30 mg/dL — AB
SPECIFIC GRAVITY, URINE: 1.02 (ref 1.005–1.030)

## 2015-08-14 LAB — MRSA PCR SCREENING: MRSA by PCR: NEGATIVE

## 2015-08-14 MED ORDER — ROSUVASTATIN CALCIUM 40 MG PO TABS
40.0000 mg | ORAL_TABLET | Freq: Every day | ORAL | Status: DC
  Administered 2015-08-14 – 2015-08-16 (×4): 40 mg via ORAL
  Filled 2015-08-14 (×5): qty 1

## 2015-08-14 MED ORDER — PANTOPRAZOLE SODIUM 40 MG IV SOLR
40.0000 mg | INTRAVENOUS | Status: DC
  Administered 2015-08-14 (×2): 40 mg via INTRAVENOUS
  Filled 2015-08-14 (×2): qty 40

## 2015-08-14 MED ORDER — ACETAMINOPHEN 325 MG PO TABS
650.0000 mg | ORAL_TABLET | ORAL | Status: DC | PRN
  Administered 2015-08-17: 650 mg via ORAL
  Filled 2015-08-14: qty 2

## 2015-08-14 MED ORDER — DEXTROSE-NACL 5-0.45 % IV SOLN
INTRAVENOUS | Status: DC
  Administered 2015-08-14: 02:00:00 via INTRAVENOUS

## 2015-08-14 MED ORDER — GI COCKTAIL ~~LOC~~
30.0000 mL | Freq: Four times a day (QID) | ORAL | Status: DC | PRN
  Filled 2015-08-14: qty 30

## 2015-08-14 MED ORDER — FLUOXETINE HCL 10 MG PO CAPS
10.0000 mg | ORAL_CAPSULE | Freq: Every day | ORAL | Status: DC
  Administered 2015-08-14 – 2015-08-17 (×4): 10 mg via ORAL
  Filled 2015-08-14 (×4): qty 1

## 2015-08-14 MED ORDER — DEXTROSE 5 % IV SOLN
1.0000 g | INTRAVENOUS | Status: DC
  Administered 2015-08-14 – 2015-08-16 (×3): 1 g via INTRAVENOUS
  Filled 2015-08-14 (×4): qty 10

## 2015-08-14 NOTE — Progress Notes (Signed)
PROGRESS NOTE  Tracey MossLou [name] WUJ:[id number] DOB: 10/31/1930 DOA: 08/13/2015 PCP: Darrow BussingKOIRALA,DIBAS, MD Outpatient Specialists:    Subjective: When asked why she was here she said "because of my mother". She reports feeling increasingly weak over the last few months and has had several episodes of vomiting over the last few days, though she cannot remember when the last time she vomited was. Right now she is complaining of feeling cold and being hungry.   She has a history of advanced dementia and poor memory.   Brief Narrative: Patient is a 89 y/ofemale with history of stroke likely secondary to PAF (on Xarelto), dementia, and chronic back pain who was brought by her daughter with complaints of increasing weakness and 2 days of nausea and vomiting without fever, abdominal pain, diarrhea, or constipation. Per ED notes, she fell at 1700 on 11/12/2015 and has not been eating or drinking as per usual. In the ED troponin was noted to be elevated at 0.80 and EKG showed recent probable anteroseptal infarct. Per daughter, patient has not been having any recent chest pain or palpitations.   Assessment & Plan: Active Problems:   NSTEMI (non-ST elevated myocardial infarction) (HCC)   Nausea  Elevated troponin/ acute combined heart failure - troponin 0.80 on 4/27, 0.63 today - EKG 04/27 showed NSR with LAFB, prolonged PR interval, and probable recent anteroseptal infarct - CXR showed stable mild cardiomegaly and COPD without acute cardiopulmonary process - Echo 04/28-  Left ventricle- systolic function was severely reduced. The estimated ejection fraction was in the range of 25% to 30%. Akinesis of the mid-apicalanteroseptal, anterior, anterolateral, and apical myocardium. Severe hypokinesis of the mid-apicalinferior myocardium. Features are consistent with a pseudonormal left ventricular filling pattern, with concomitant abnormal relaxation and increased filling pressure (grade 2 diastolic dysfunction). No  evidence of thrombus.  - Echo today is severely reduced in overall LV systolic function from previous echo (EF 60-65% without wall motion abnormalities in 04/2014). Concerning for multivessel CAD vs Takotsubo syndrome per echo, doubt Takotsubo with her age and history of ischemic cardiomyopathy - she does not appear fluid overloaded  - Note: she was admitted in 12/2014 and found to have elevated troponin with a peak of 0.25- it was decided by cardiology to treat medically due to advanced dementia and DNR status - awaiting cardiology consult  Urinary tract infection - UA with large leuk, positive nitrite, many bacteria, and TNTC WBCs - she denies any urinary symptoms, though with dementia and complaint of weakness will treat with Rocephin   AKI on CKD (stage III) vs. worsening CKD - Creatinine 1.48 (was 0.96 during discharge last time), BUN 43 - In the setting of lack of oral intake and vomiting- likely prerenal azotemia, though cannot rule out worsening of chronic kidney disease  - Echo today showed CHF, though not fluid overloaded on exam today - will administer IV fluids and closely monitor creatinine and I&Os  Generalized weakness and fall - Patient feels like she has been getting weaker for "months" - daughter says patient fell at 1700 on 04/27 - MRI brain without acute process - Unclear if advancing dementia vs. Polypharmacy vs. UTI - will get PT involved  Elevated Transaminase - AST 205, ALT 245 - AST elevated to 58 during last admission in the setting of dehydration, improved after fluids - liver US pending - IVFs and monitor  Dementia - previously on Aricept and Namenda, both discontinued by her neurologist due to hallucinations and AMS.  - MMSE 16/30 at outpatient neurologist  on 07/07/2015  PAF - NSR currently - continue Xarelto - monitor  Chronic back pain - Tylenol PRN  Dyslipidemia - continue statin  Elevated INR - patient is on Xarelto for PAF    DVT  prophylaxis: On anticoagulation for PAF, Xarelto Code Status: DNR Family Communication: None at bedside Disposition Plan: Remain inpatint  Consultants:   Cardiology  Procedures:   2D echo 08/14/2015:  Left ventricle- systolic function was severely reduced. The estimated ejection fraction was in the range of 25% to 30%. Akinesis of the mid-apicalanteroseptal, anterior, anterolateral, and apical myocardium. Severe hypokinesis of the mid-apicalinferior myocardium. Features are consistent with a pseudonormal left ventricular filling pattern, with concomitant abnormal relaxation and increased filling pressure (grade 2 diastolic dysfunction). No evidence of thrombus.  MRI brain 08/13/2015   Antimicrobials:  Rocephin 04/28 >   Objective: Filed Vitals:   08/13/15 2130 08/14/15 0048 08/14/15 0201  BP: 122/60 119/55 120/46  Pulse: 65 70 71  Temp: 97.8 F (36.6 C)  97.9 F (36.6 C)  TempSrc: Oral  Oral  Resp: 18 16 16   Height:   5\' 5"  (1.651 m)  Weight:   50.8 kg (111 lb 15.9 oz)  SpO2: 100% 97% 96%    Intake/Output Summary (Last 24 hours) at 08/14/15 1129 Last data filed at 08/14/15 0300  Gross per 24 hour  Intake   52.5 ml  Output      0 ml  Net   52.5 ml   Filed Weights   08/14/15 0201  Weight: 50.8 kg (111 lb 15.9 oz)    Examination: Constitutional: NAD, calm, comfortable Filed Vitals:   08/13/15 2130 08/14/15 0048 08/14/15 0201  BP: 122/60 119/55 120/46  Pulse: 65 70 71  Temp: 97.8 F (36.6 C)  97.9 F (36.6 C)  TempSrc: Oral  Oral  Resp: 18 16 16   Height:   5\' 5"  (1.651 m)  Weight:   50.8 kg (111 lb 15.9 oz)  SpO2: 100% 97% 96%   General: Alert, awake, no acute distress.   Eyes: PERRL, lids and conjunctivae normal Neck: normal, supple, no masses, no thyromegaly Respiratory: clear to auscultation bilaterally, no wheezing, no crackles. Normal respiratory effort. No accessory muscle use. Cardiovascular: Regular rate and rhythm, no murmurs / rubs / gallops.  No extremity edema. 2+ pedal pulses. No JVD. Abdomen: no tenderness, no masses palpated. No hepatosplenomegaly. Bowel sounds positive.  Musculoskeletal: no clubbing / cyanosis, no edema. Skin: no rashes, lesions, ulcers. Neurologic: CN 2-12 grossly intact. Moving all extremities Psychiatric: Normal judgment and insight. Alert and oriented x 3. Flat affect.    Data Reviewed: I have personally reviewed following labs and imaging studies  CBC:  Recent Labs Lab 08/13/15 2148  WBC 10.2  HGB 11.0*  HCT 33.3*  MCV 87.4  PLT 228   Basic Metabolic Panel:  Recent Labs Lab 08/13/15 2148  NA 136  K 3.8  CL 103  CO2 22  GLUCOSE 150*  BUN 43*  CREATININE 1.48*  CALCIUM 9.2   GFR: Estimated Creatinine Clearance: 23.1 mL/min (by C-G formula based on Cr of 1.48). Liver Function Tests:  Recent Labs Lab 08/13/15 2148  AST 205*  ALT 245*  ALKPHOS 95  BILITOT 1.2  PROT 6.5  ALBUMIN 4.0   No results for input(s): LIPASE, AMYLASE in the last 168 hours. No results for input(s): AMMONIA in the last 168 hours. Coagulation Profile:  Recent Labs Lab 08/13/15 2148  INR 2.01*   Cardiac Enzymes:  Recent Labs Lab 08/13/15  2148 08/14/15 0441  TROPONINI 0.80* 0.63*   BNP (last 3 results) No results for input(s): PROBNP in the last 8760 hours. HbA1C: No results for input(s): HGBA1C in the last 72 hours. CBG: No results for input(s): GLUCAP in the last 168 hours. Lipid Profile: No results for input(s): CHOL, HDL, LDLCALC, TRIG, CHOLHDL, LDLDIRECT in the last 72 hours. Thyroid Function Tests: No results for input(s): TSH, T4TOTAL, FREET4, T3FREE, THYROIDAB in the last 72 hours. Anemia Panel: No results for input(s): VITAMINB12, FOLATE, FERRITIN, TIBC, IRON, RETICCTPCT in the last 72 hours. Urine analysis:    Component Value Date/Time   COLORURINE YELLOW 08/14/2015 0030   APPEARANCEUR CLOUDY* 08/14/2015 0030   LABSPEC 1.020 08/14/2015 0030   PHURINE 5.5 08/14/2015 0030    GLUCOSEU NEGATIVE 08/14/2015 0030   HGBUR TRACE* 08/14/2015 0030   BILIRUBINUR NEGATIVE 08/14/2015 0030   KETONESUR NEGATIVE 08/14/2015 0030   PROTEINUR 30* 08/14/2015 0030   UROBILINOGEN 0.2 01/03/2015 1316   NITRITE POSITIVE* 08/14/2015 0030   LEUKOCYTESUR LARGE* 08/14/2015 0030   Sepsis Labs: Invalid input(s): PROCALCITONIN, LACTICIDVEN  Recent Results (from the past 240 hour(s))  MRSA PCR Screening     Status: None   Collection Time: 08/14/15  2:12 AM  Result Value Ref Range Status   MRSA by PCR NEGATIVE NEGATIVE Final    Comment:        The GeneXpert MRSA Assay (FDA approved for NASAL specimens only), is one component of a comprehensive MRSA colonization surveillance program. It is not intended to diagnose MRSA infection nor to guide or monitor treatment for MRSA infections.       Radiology Studies: Dg Chest 2 View  08/13/2015  CLINICAL DATA:  Unwitnessed fall today 5 p.m. Altered mental status. History of hypertension, stroke. EXAM: CHEST  2 VIEW COMPARISON:  Chest radiograph January 03, 2015 FINDINGS: The cardiac silhouette is mildly enlarged, similar. Mediastinal silhouette is unremarkable. Mild chronic interstitial changes and increased lung volumes without pleural effusion or focal consolidation. Biapical pleural thickening is similar. No pneumothorax. Loop monitor projects over lower chest. Soft tissue planes and included osseous structures are nonsuspicious. IMPRESSION: Stable mild cardiomegaly and findings of COPD. No superimposed acute pulmonary process. Electronically Signed   By: Awilda Metro M.D.   On: 08/13/2015 22:07   Ct Head Wo Contrast  08/13/2015  CLINICAL DATA:  Unwitnessed fall today at 5 p.m. Altered mental status. Knee pain. History of Alzheimer's disease, hypertension, dyslipidemia. EXAM: CT HEAD WITHOUT CONTRAST TECHNIQUE: Contiguous axial images were obtained from the base of the skull through the vertex without intravenous contrast.  COMPARISON:  MRI head January 03, 2015 FINDINGS: INTRACRANIAL CONTENTS: Moderate to severe ventriculomegaly on the basis of global parenchymal brain volume loss. No intraparenchymal hemorrhage, mass effect nor midline shift. Confluent supratentorial white matter hypodensities. Bifrontal encephalomalacia. Old LEFT thalamus lacunar infarct. No acute large vascular territory infarcts. No abnormal extra-axial fluid collections. Basal cisterns are patent. Moderate calcific atherosclerosis of the carotid siphons. ORBITS: The included ocular globes and orbital contents are non-suspicious. SINUSES: Small RIGHT sphenoid mucosal retention cyst without paranasal sinus air-fluid levels. Mild ethmoid mucosal thickening. Mastoid air cells are well aerated. Soft tissue within the external auditory canals compatible with cerumen. SKULL/SOFT TISSUES: No skull fracture. No significant soft tissue swelling. 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 IMPRESSION: No acute intracranial process. Stable moderate to severe global brain atrophy and severe chronic small vessel ischemic disease. Old bifrontal infarcts within MCA territories and old LEFT thalamus lacunar infarct. Electronically Signed  By: Awilda Metro M.D.   On: 08/13/2015 22:14     Scheduled Meds: . feeding supplement  1 Container Oral BID BM  . FLUoxetine  10 mg Oral Daily  . loratadine  10 mg Oral Daily  . pantoprazole (PROTONIX) IV  40 mg Intravenous Q24H  . polyethylene glycol  17 g Oral QODAY  . Rivaroxaban  15 mg Oral Daily  . rosuvastatin  40 mg Oral q1800   Continuous Infusions: . sodium chloride 100 mL/hr at 08/14/15 1053    Time spent: 30 minutes  Lorin Glass PA-S wrote parts of this note   Tracey [name], A MD [facility]   If 7PM-7AM, please contact night-coverage [email or web address] Password [facility] 08/14/2015, 11:29 AM

## 2015-08-14 NOTE — Progress Notes (Signed)
PHARMACIST - PHYSICIAN ORDER COMMUNICATION  CONCERNING: P&T Medication Policy on Herbal Medications  DESCRIPTION:  This patient's order for:  melatonin  has been noted.  This product(s) is classified as an "herbal" or natural product. Due to a lack of definitive safety studies or FDA approval, nonstandard manufacturing practices, plus the potential risk of unknown drug-drug interactions while on inpatient medications, the Pharmacy and Therapeutics Committee does not permit the use of "herbal" or natural products of this type within [facility].   ACTION TAKEN: The pharmacy department is unable to verify this order at this time and order has been discontinued. Please reevaluate patient's clinical condition at discharge and address if the herbal or natural product(s) should be resumed at that time.  Adalberto ColeNikola Wrenly Lauritsen, PharmD, BCPS Pager [phone] 08/14/2015 1:56 AM

## 2015-08-14 NOTE — Clinical Social Work Note (Signed)
Clinical Social Work Assessment  Patient Details  Name: Tracey [name] MRN: [id number] Date of Birth: 10/04/1930  Date of referral:  08/14/15               Reason for consult:  Facility Placement                Permission sought to share information with:    Permission granted to share information::     Name::        Agency::     Relationship::     Contact Information:     Housing/Transportation Living arrangements for the past 2 months:  Assisted Living Facility Source of Information:  Adult Children (daughter) Patient Interpreter Needed:  None Criminal Activity/Legal Involvement Pertinent to Current Situation/Hospitalization:  No - Comment as needed Significant Relationships:  Adult Children Lives with:  Facility Resident Do you feel safe going back to the place where you live?  Yes Need for family participation in patient care:  Yes (Comment)  Care giving concerns:  Pt has been requiring more assistance recently   Office managerocial Worker assessment / plan:  CSW spoke to pt's daughter, Chip BoerVicki and introduced self.  Pt was disoriented and not able to complete interview.  CSW explained role of CSW.  Dtr explained that her mother has been residing at [facility] for approximately [facility] years.  She stated that recently her mother has been weaker and requiring more assistance.  Prior to moving to ALF, [place] lived alone [place] apartment [place]  Pt has a son that is incarcerated in [place].    CSW explained that PT has been consulted, will assess pt's functioning and make recommendations for discharge.  CSW will continue to follow and assist with d/c planning needs.  Employment status:  Retired Health and safety inspectornsurance information:  Medicare PT Recommendations:  Not assessed at this time Information / Referral to community resources:     Patient/Family's Response to care:  Dtr was appreciative of CSW assistance.    Patient/Family's Understanding of and Emotional Response to Diagnosis, Current Treatment, and  Prognosis:  Dtr acknowledged that pt has been weaker recently.  Dtr is supportive.  Emotional Assessment Appearance:  Appears stated age Attitude/Demeanor/Rapport:  Other (cooperative) Affect (typically observed):  Pleasant Orientation:  Oriented to Self, Oriented to Place Alcohol / Substance use:  Alcohol Use (4.2 oz/week per chart review) Psych involvement (Current and /or in the community):  No (Comment)  Discharge Needs  Concerns to be addressed:  Discharge Planning Concerns Readmission within the last 30 days:  No Current discharge risk:  Cognitively Impaired Barriers to Discharge:  Continued Medical Work up   [place], ClaytonLynetta D, LCSW 08/14/2015, 12:47 PM

## 2015-08-14 NOTE — Care Management Obs Status (Signed)
MEDICARE OBSERVATION STATUS NOTIFICATION   Patient Details  Name: Tracey [name] MRN: [id number] Date of Birth: 10/31/1930   Medicare Observation Status Notification Given:  Yes    MahabirOlegario Messier, Aniyla Harling, RN 08/14/2015, 11:30 AM

## 2015-08-14 NOTE — Progress Notes (Signed)
  Echocardiogram 2D Echocardiogram has been performed.  Arvil ChacoFoster, Henlee Donovan 08/14/2015, 9:24 AM

## 2015-08-14 NOTE — Consult Note (Signed)
Reason for Consult: new cardiomyopathy and elevated troponin.  Referring Physician: Dr. Dreama Saa   PCP:  Lujean Amel, MD  Primary Cardiologist: Dr. Juanetta Snow is an 82 y.o. female.    Chief Complaint:  Admitted early AM today for nausea and vomiting.   She had elevated troponin.   HPI: Asked to see 82 year old female with hx cryptogenic stroke, s/p ILR insertion. She has dementia and this appears to be worsening.  No hx of CAD.  She saw Dr. Lovena Le on 08/07/15 and found to have PAF on ILR interrogation.  She was placed on Xarelto 15 mg daily.  Pt not aware she had irregular HR. She also has sinus node dysfunction.  PPM was discussed per Dr. Lovena Le "in light of her sedentary lifestyle and because she has worsening dementia, I am inclined not to recommend proceeding with PPM insertion and I have discussed this with her daughter who is with her today and in agreement."  On previous hospitalization her troponin was elevated pk then 0.25.  Previous Echo 04/2014 with EF 60-65%, G1DD, mild MR, intermittent LVOT obstruction during diastole. Mild TR PA pk pressure 32 mmHg.   She was admitted after fall yesterday and she had had Nausea and vomiting.  Her troponin was elevated on admit.   CT head without acute intracranial process.    Today on Echo EF in now 25-30%, G2DD.  LA mildly dilated and PA pk pressure now 60 mmHg.  She has new wall motion abnormalities from previous.  On echo ? CAD VS. Takotsubo.  CXR stable no HF.  + UTI.  on Rocephin,   Her AST 205, ALT 245  Liver U/S pending  --- BP stable.   Troponins pk 0.80 on admit now 0.48.   EKG SR with new Q wave in V3 tough this was present on 08/07/15 when HR was 39- junct. Rhythm.    Tele strips with SR with PACs and some SB.   She admits to mild chest pain yesterday but none today.  No SOB.  occ lightheadedness.   She knows this is April but does not know year nor does she care.   Past Medical History  Diagnosis Date  . Memory  deficit 10/17/2012  . Abnormality of gait 10/17/2012  . Urinary incontinence   . Hypertension   . Dyslipidemia   . Spinal stenosis   . Hemiparesis and alteration of sensations as late effects of stroke ([place]) 06/24/2014  . Anemia   . Stroke ([place]) 2000's    mild left hemiparesis  . Lumbosacral spondylosis   . Arthritis     "knees" (12/12/2014)  . Spinal stenosis, lumbar   . Alzheimer disease     Past Surgical History  Procedure Laterality Date  . Tubal ligation Bilateral   . Cataract extraction w/ intraocular lens  implant, bilateral Bilateral   . Ep implantable device N/A 09/08/2014    Procedure: Loop Recorder Insertion;  Surgeon: Evans Lance, MD;  Location: [facility];  Service: Cardiovascular;  Laterality: N/A;    Family History  Problem Relation Age of Onset  . Parkinsonism Brother   . Dementia Brother   . Heart disease Brother    Social History:  reports that she has never smoked. She has never used smokeless tobacco. She reports that she drinks about 4.2 oz of alcohol per week. She reports that she does not use illicit drugs.  Allergies:  Allergies  Allergen  Reactions  . Alendronate Sodium     unknown  . Namenda [Memantine Hcl] Other (See Comments)    halucinations  . Simvastatin     unknown   OUTPATIENT MEDICATIONS: No current facility-administered medications on file prior to encounter.   Current Outpatient Prescriptions on File Prior to Encounter  Medication Sig Dispense Refill  . acetaminophen (TYLENOL) 500 MG tablet Take 500 mg by mouth 2 (two) times daily. May also give q6hprn    . feeding supplement (BOOST HIGH PROTEIN) LIQD Take 1 Container by mouth 2 (two) times daily between meals.    Marland Kitchen FLUoxetine (PROZAC) 10 MG tablet Take 10 mg by mouth daily.    Marland Kitchen loratadine (CLARITIN) 10 MG tablet Take 10 mg by mouth daily.    . Melatonin 3 MG CAPS Take 3 mg by mouth at bedtime.    . Omega-3 Fatty Acids (FISH OIL) 1000 MG CAPS Take 1,000 mg by mouth daily.     Marland Kitchen omeprazole (PRILOSEC) 20 MG capsule Take 20 mg by mouth daily.    . polyethylene glycol (MIRALAX / GLYCOLAX) packet Take 17 g by mouth every other day. 14 each 0  . Rivaroxaban (XARELTO) 15 MG TABS tablet Take 1 tablet (15 mg total) by mouth daily. 30 tablet 6   Current Medications Scheduled Meds: . cefTRIAXone (ROCEPHIN)  IV  1 g Intravenous Q24H  . feeding supplement  1 Container Oral BID BM  . FLUoxetine  10 mg Oral Daily  . loratadine  10 mg Oral Daily  . pantoprazole (PROTONIX) IV  40 mg Intravenous Q24H  . polyethylene glycol  17 g Oral QODAY  . Rivaroxaban  15 mg Oral Daily  . rosuvastatin  40 mg Oral q1800   Continuous Infusions: . sodium chloride 100 mL/hr at 08/14/15 1053   PRN Meds:.acetaminophen, gi cocktail   Results for orders placed or performed during the hospital encounter of 08/13/15 (from the past 48 hour(s))  CBC     Status: Abnormal   Collection Time: 08/13/15  9:48 PM  Result Value Ref Range   WBC 10.2 4.0 - 10.5 K/uL   RBC 3.81 (L) 3.87 - 5.11 MIL/uL   Hemoglobin 11.0 (L) 12.0 - 15.0 g/dL   HCT 33.3 (L) 36.0 - 46.0 %   MCV 87.4 78.0 - 100.0 fL   MCH 28.9 26.0 - 34.0 pg   MCHC 33.0 30.0 - 36.0 g/dL   RDW 15.1 11.5 - 15.5 %   Platelets 228 150 - 400 K/uL  Comprehensive metabolic panel     Status: Abnormal   Collection Time: 08/13/15  9:48 PM  Result Value Ref Range   Sodium 136 135 - 145 mmol/L   Potassium 3.8 3.5 - 5.1 mmol/L   Chloride 103 101 - 111 mmol/L   CO2 22 22 - 32 mmol/L   Glucose, Bld 150 (H) 65 - 99 mg/dL   BUN 43 (H) 6 - 20 mg/dL   Creatinine, Ser 1.48 (H) 0.44 - 1.00 mg/dL   Calcium 9.2 8.9 - 10.3 mg/dL   Total Protein 6.5 6.5 - 8.1 g/dL   Albumin 4.0 3.5 - 5.0 g/dL   AST 205 (H) 15 - 41 U/L   ALT 245 (H) 14 - 54 U/L   Alkaline Phosphatase 95 38 - 126 U/L   Total Bilirubin 1.2 0.3 - 1.2 mg/dL   GFR calc non Af Amer 32 (L) >60 mL/min   GFR calc Af Amer 37 (L) >60 mL/min    Comment: (NOTE)  The eGFR has been calculated using  the CKD EPI equation. This calculation has not been validated in all clinical situations. eGFR's persistently <60 mL/min signify possible Chronic Kidney Disease.    Anion gap 11 5 - 15  Protime-INR     Status: Abnormal   Collection Time: 08/13/15  9:48 PM  Result Value Ref Range   Prothrombin Time 22.6 (H) 11.6 - 15.2 seconds   INR 2.01 (H) 0.00 - 1.49  Troponin I     Status: Abnormal   Collection Time: 08/13/15  9:48 PM  Result Value Ref Range   Troponin I 0.80 (HH) <0.031 ng/mL    Comment:        POSSIBLE MYOCARDIAL ISCHEMIA. SERIAL TESTING RECOMMENDED. RESULT REPEATED AND VERIFIED CRITICAL RESULT CALLED TO, READ BACK BY AND VERIFIED WITH: Les Pou RN @ [phone] ON 08/13/15 BY C DAVIS   Urinalysis, Routine w reflex microscopic (not at [facility])     Status: Abnormal   Collection Time: 08/14/15 12:30 AM  Result Value Ref Range   Color, Urine YELLOW YELLOW   APPearance CLOUDY (A) CLEAR   Specific Gravity, Urine 1.020 1.005 - 1.030   pH 5.5 5.0 - 8.0   Glucose, UA NEGATIVE NEGATIVE mg/dL   Hgb urine dipstick TRACE (A) NEGATIVE   Bilirubin Urine NEGATIVE NEGATIVE   Ketones, ur NEGATIVE NEGATIVE mg/dL   Protein, ur 30 (A) NEGATIVE mg/dL   Nitrite POSITIVE (A) NEGATIVE   Leukocytes, UA LARGE (A) NEGATIVE  Urine microscopic-add on     Status: Abnormal   Collection Time: 08/14/15 12:30 AM  Result Value Ref Range   Squamous Epithelial / LPF 0-5 (A) NONE SEEN   WBC, UA TOO NUMEROUS TO COUNT 0 - 5 WBC/hpf   RBC / HPF 0-5 0 - 5 RBC/hpf   Bacteria, UA MANY (A) NONE SEEN  MRSA PCR Screening     Status: None   Collection Time: 08/14/15  2:12 AM  Result Value Ref Range   MRSA by PCR NEGATIVE NEGATIVE    Comment:        The GeneXpert MRSA Assay (FDA approved for NASAL specimens only), is one component of a comprehensive MRSA colonization surveillance program. It is not intended to diagnose MRSA infection nor to guide or monitor treatment for MRSA infections.   Troponin I      Status: Abnormal   Collection Time: 08/14/15  4:41 AM  Result Value Ref Range   Troponin I 0.63 (HH) <0.031 ng/mL    Comment:        POSSIBLE MYOCARDIAL ISCHEMIA. SERIAL TESTING RECOMMENDED. CRITICAL VALUE NOTED.  VALUE IS CONSISTENT WITH PREVIOUSLY REPORTED AND CALLED VALUE.   Troponin I     Status: Abnormal   Collection Time: 08/14/15 11:18 AM  Result Value Ref Range   Troponin I 0.48 (H) <0.031 ng/mL    Comment:        PERSISTENTLY INCREASED TROPONIN VALUES IN THE RANGE OF 0.04-0.49 ng/mL CAN BE SEEN IN:       -UNSTABLE ANGINA       -CONGESTIVE HEART FAILURE       -MYOCARDITIS       -CHEST TRAUMA       -ARRYHTHMIAS       -LATE PRESENTING MYOCARDIAL INFARCTION       -COPD   CLINICAL FOLLOW-UP RECOMMENDED.    Dg Chest 2 View  08/13/2015  CLINICAL DATA:  Unwitnessed fall today 5 p.m. Altered mental status. History of hypertension, stroke. EXAM: CHEST  2 VIEW COMPARISON:  Chest radiograph January 03, 2015 FINDINGS: The cardiac silhouette is mildly enlarged, similar. Mediastinal silhouette is unremarkable. Mild chronic interstitial changes and increased lung volumes without pleural effusion or focal consolidation. Biapical pleural thickening is similar. No pneumothorax. Loop monitor projects over lower chest. Soft tissue planes and included osseous structures are nonsuspicious. IMPRESSION: Stable mild cardiomegaly and findings of COPD. No superimposed acute pulmonary process. Electronically Signed   By: Elon Alas M.D.   On: 08/13/2015 22:07   Ct Head Wo Contrast  08/13/2015  CLINICAL DATA:  Unwitnessed fall today at 5 p.m. Altered mental status. Knee pain. History of Alzheimer's disease, hypertension, dyslipidemia. EXAM: CT HEAD WITHOUT CONTRAST TECHNIQUE: Contiguous axial images were obtained from the base of the skull through the vertex without intravenous contrast. COMPARISON:  MRI head January 03, 2015 FINDINGS: INTRACRANIAL CONTENTS: Moderate to severe ventriculomegaly  on the basis of global parenchymal brain volume loss. No intraparenchymal hemorrhage, mass effect nor midline shift. Confluent supratentorial white matter hypodensities. Bifrontal encephalomalacia. Old LEFT thalamus lacunar infarct. No acute large vascular territory infarcts. No abnormal extra-axial fluid collections. Basal cisterns are patent. Moderate calcific atherosclerosis of the carotid siphons. ORBITS: The included ocular globes and orbital contents are non-suspicious. SINUSES: Small RIGHT sphenoid mucosal retention cyst without paranasal sinus air-fluid levels. Mild ethmoid mucosal thickening. Mastoid air cells are well aerated. Soft tissue within the external auditory canals compatible with cerumen. SKULL/SOFT TISSUES: No skull fracture. No significant soft tissue swelling. 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 IMPRESSION: No acute intracranial process. Stable moderate to severe global brain atrophy and severe chronic small vessel ischemic disease. Old bifrontal infarcts within MCA territories and old LEFT thalamus lacunar infarct. Electronically Signed   By: Elon Alas M.D.   On: 08/13/2015 22:14    ROS: General:no colds or fevers, mild weight changes Skin:no rashes or ulcers HEENT:no blurred vision, no congestion CV:see HPI PUL:see HPI GI:no diarrhea constipation or melena, no indigestion GU:no hematuria, no dysuria MS:no joint pain, no claudication Neuro:no syncope, but did have fall yesterday she admits to + lightheadedness Endo:no diabetes, no thyroid disease  Blood pressure 120/46, pulse 71, temperature 97.9 F (36.6 C), temperature source Oral, resp. rate 16, height _0  (1.651 m), weight 111 lb 15.9 oz (50.8 kg), SpO2 96 %.  Wt Readings from Last 3 Encounters:  08/14/15 111 lb 15.9 oz (50.8 kg)  08/07/15 114 lb (51.71 kg)  07/21/15 115 lb 9.6 oz (52.436 kg)    PE: General:Pleasant affect, NAD Skin:Warm and dry, brisk capillary  refill HEENT:normocephalic, sclera clear, mucus membranes moist Neck:supple, no JVD, no bruits  Heart:S1S2 RRR without murmur, gallup, rub or click Lungs:clear without rales, rhonchi, or wheezes YWV:PXTG, non tender, + BS, do not palpate liver spleen or masses Ext:no lower ext edema, 2+ pedal pulses, 2+ radial pulses Neuro:alert and oriented to person, place "clinic" and month, MAE, follows commands, + facial symmetry    Assessment/Plan Principal Problem:   NSTEMI (non-ST elevated myocardial infarction) ([place]) Active Problems:   Acute kidney injury ([place])   Elevated transaminase level   Nausea   Acute combined systolic and diastolic heart failure ([facility])   PAF (paroxysmal atrial fibrillation) ([facility])   UTI (urinary tract infection)   Congestive dilated cardiomyopathy ([facility])  Cardiomyopathy: unclear ischemic vs. Non ischemic.  Though concern for ischemic with type of wall motion abnormality and elevated troponin.  Cannot add BB with known SSS with HR to 39 and with her dementia not wanting PPM.  We  could add imdur and low dose ACE/ARB and monitor Cr.  With DNR not sure we should pursue nuc study if we are not to do cath.  Will need to discuss with family.  Agree with statin. MD to see.  Is not volume overloaded currently.   NSTEMI see above may be demand ischemia   PAF on xarelto  SSS with hx of HR to  39. PPM was not recommended.  Pt is DNR.        [place]  Nurse Practitioner Certified [place] Pager [phone] or after 5pm or weekends call [phone] 08/14/2015, 1:58 PM    I have personally seen and examined this patient with Cecilie Kicks, NP. I agree with the assessment and plan as outlined above. She is known to have advanced dementia and current DNR status. She is followed in our office by Dr. Lovena Le and has not been offered a pacemaker despite profound bradycardia. She is felt to be a poor candidate for a pacemaker due to her advanced dementia. She  is now admitted with abdominal pain, N/V. Troponin with mild elevation leading to echo. Echo with LV systolic dysfunction which is new. My exam shows elderly female with confusion. She is pleasant. RRR. Lungs clear. No LE edema. Labs reviewed.   She is not a good candidate for invasive cardiac evaluation given her age and advanced dementia. She is having no chest pain. I would favor a conservative approach. I would not pursue a nuclear stress test as she is not felt to be a candidate for cardiac cath. Would add Ace-inh. No beta blocker with bradycardia. No other recommendations.   MCALHANY,CHRISTOPHER 08/14/2015 2:24 PM

## 2015-08-14 NOTE — H&P (Signed)
History and Physical  Chapman MossLou Bonafede VHQ:[id number] DOB: 01/21/1931 DOA: 08/13/2015  PCP:  Darrow BussingKOIRALA,DIBAS, MD   Chief Complaint:  nausea  History of Present Illness:  Patient is a 82 yo  Female with history of cryptogenic stroke, dementia,possible afib who was brought by her daughter with cc of nausea for 2 days with 2 vomiting episodes without abdominal pain,diarrhea or constipation. She denied chest pain ,dyspnea , fever, chills,cough. She does not remember her medications but she denied history of MI. She said she feels she can go home.   Review of Systems:  CONSTITUTIONAL:     No night sweats.  No fatigue.  No fever. No chills. Eyes:                            No visual changes.  No eye pain.  No eye discharge.   ENT:                              No epistaxis.  No sinus pain.  No sore throat.   No congestion. RESPIRATORY:           No cough.  No wheeze.  No hemoptysis.  No dyspnea CARDIOVASCULAR   :  No chest pains.  No palpitations. GASTROINTESTINAL:  No abdominal pain.  +nausea. No vomiting.  No diarrhea. No constipation.  No hematemesis.  No hematochezia.  No melena. GENITOURINARY:      No urgency.  No frequency.  No dysuria.  No hematuria.  Noobstructive symptoms.  No discharge.  No pain.  MUSCULOSKELETAL:  No musculoskeletal pain.  No joint swelling.  No arthritis. NEUROLOGICAL:        No confusion.  No weakness. No headache. No seizure. PSYCHIATRIC:             No depression. No anxiety. No suicidal ideation. SKIN:                             No rashes.  No lesions.  No wounds. ENDOCRINE:                No weight loss.  No polydipsia.  No polyuria.  No polyphagia. HEMATOLOGIC:           No purpura.  No petechiae.  No bleeding.  ALLERGIC                 : No pruritus.  No angioedema Other:  Past Medical and Surgical History:   Past Medical History  Diagnosis Date  . Memory deficit 10/17/2012  . Abnormality of gait 10/17/2012  . Urinary incontinence   . Hypertension   .  Dyslipidemia   . Spinal stenosis   . Hemiparesis and alteration of sensations as late effects of stroke ([facility]) 06/24/2014  . Anemia   . Stroke ([facility]) 2000's    mild left hemiparesis  . Lumbosacral spondylosis   . Arthritis     "knees" (12/12/2014)  . Spinal stenosis, lumbar   . Alzheimer disease    Past Surgical History  Procedure Laterality Date  . Tubal ligation Bilateral   . Cataract extraction w/ intraocular lens  implant, bilateral Bilateral   . Ep implantable device N/A 09/08/2014    Procedure: Loop Recorder Insertion;  Surgeon: Marinus MawGregg W Taylor, MD;  Location: [facility];  Service: Cardiovascular;  Laterality: N/A;  Social History:   reports that she has never smoked. She has never used smokeless tobacco. She reports that she drinks about 4.2 oz of alcohol per week. She reports that she does not use illicit drugs.    Allergies  Allergen Reactions  . Alendronate Sodium     unknown  . Namenda [Memantine Hcl] Other (See Comments)    halucinations  . Simvastatin     unknown    Family History  Problem Relation Age of Onset  . Parkinsonism Brother   . Dementia Brother   . Heart disease Brother       Prior to Admission medications   Medication Sig Start Date End Date Taking? Authorizing Provider  acetaminophen (TYLENOL) 500 MG tablet Take 500 mg by mouth 2 (two) times daily. May also give q6hprn   Yes Historical Provider, MD  acetaminophen-codeine (TYLENOL #2) 300-15 MG tablet Take 1 tablet by mouth daily.   Yes Historical Provider, MD  clobetasol (TEMOVATE) 0.05 % external solution Apply 1 application topically daily. Use mon, tue, wed, thur, fri for itching   Yes Historical Provider, MD  feeding supplement (BOOST HIGH PROTEIN) LIQD Take 1 Container by mouth 2 (two) times daily between meals.   Yes Historical Provider, MD  FLUoxetine (PROZAC) 10 MG tablet Take 10 mg by mouth daily.   Yes Historical Provider, MD  hydrocortisone cream 1 % Apply 1 application  topically See admin instructions. Use mon, tue, wed, thu, fri   Yes Historical Provider, MD  loratadine (CLARITIN) 10 MG tablet Take 10 mg by mouth daily.   Yes Historical Provider, MD  Melatonin 3 MG CAPS Take 3 mg by mouth at bedtime.   Yes Historical Provider, MD  Omega-3 Fatty Acids (FISH OIL) 1000 MG CAPS Take 1,000 mg by mouth daily.   Yes Historical Provider, MD  omeprazole (PRILOSEC) 20 MG capsule Take 20 mg by mouth daily.   Yes Historical Provider, MD  PENNSAID 2 % SOLN Place 2 application onto the skin 2 (two) times daily. 08/11/15  Yes Historical Provider, MD  polyethylene glycol (MIRALAX / GLYCOLAX) packet Take 17 g by mouth every other day. 12/13/14  Yes Shanker Levora Dredge, MD  promethazine (PHENERGAN) 25 MG tablet Take 25 mg by mouth every 8 (eight) hours as needed for nausea or vomiting.   Yes Historical Provider, MD  Rivaroxaban (XARELTO) 15 MG TABS tablet Take 1 tablet (15 mg total) by mouth daily. 07/21/15  Yes Marinus Maw, MD    Physical Exam: BP 122/60 mmHg  Pulse 65  Temp(Src) 97.8 F (36.6 C) (Oral)  Resp 18  SpO2 100%  GENERAL :   Alert and cooperative, and appears to be in no acute distress. HEAD:           normocephalic. EYES:            PERRL, EOMI.  vision is grossly intact. EARS:           hearing grossly intact. NOSE:           No nasal discharge. THROAT:     Oral cavity and pharynx normal.   NECK:          supple CARDIAC:    Normal S1 and S2. No gallop. No murmurs.  Vascular:     no peripheral edema. LUNGS:       Clear to auscultation  ABDOMEN: Positive bowel sounds. Soft, nondistended, nontender.    MSK:  No joint erythema or tenderness. EXT           : No significant deformity or joint abnormality. Neuro        : Alert, oriented to person, place, and time.                      CN II-XII intact.                                    SKIN:            No rash. No lesions. PSYCH:       No hallucination. Patient is not suicidal.          Labs  on Admission:  Reviewed.   Radiological Exams on Admission: Dg Chest 2 View  08/13/2015  CLINICAL DATA:  Unwitnessed fall today 5 p.m. Altered mental status. History of hypertension, stroke. EXAM: CHEST  2 VIEW COMPARISON:  Chest radiograph January 03, 2015 FINDINGS: The cardiac silhouette is mildly enlarged, similar. Mediastinal silhouette is unremarkable. Mild chronic interstitial changes and increased lung volumes without pleural effusion or focal consolidation. Biapical pleural thickening is similar. No pneumothorax. Loop monitor projects over lower chest. Soft tissue planes and included osseous structures are nonsuspicious. IMPRESSION: Stable mild cardiomegaly and findings of COPD. No superimposed acute pulmonary process. Electronically Signed   By: Awilda Metro M.D.   On: 08/13/2015 22:07   Ct Head Wo Contrast  08/13/2015  CLINICAL DATA:  Unwitnessed fall today at 5 p.m. Altered mental status. Knee pain. History of Alzheimer's disease, hypertension, dyslipidemia. EXAM: CT HEAD WITHOUT CONTRAST TECHNIQUE: Contiguous axial images were obtained from the base of the skull through the vertex without intravenous contrast. COMPARISON:  MRI head January 03, 2015 FINDINGS: INTRACRANIAL CONTENTS: Moderate to severe ventriculomegaly on the basis of global parenchymal brain volume loss. No intraparenchymal hemorrhage, mass effect nor midline shift. Confluent supratentorial white matter hypodensities. Bifrontal encephalomalacia. Old LEFT thalamus lacunar infarct. No acute large vascular territory infarcts. No abnormal extra-axial fluid collections. Basal cisterns are patent. Moderate calcific atherosclerosis of the carotid siphons. ORBITS: The included ocular globes and orbital contents are non-suspicious. SINUSES: Small RIGHT sphenoid mucosal retention cyst without paranasal sinus air-fluid levels. Mild ethmoid mucosal thickening. Mastoid air cells are well aerated. Soft tissue within the external  auditory canals compatible with cerumen. SKULL/SOFT TISSUES: No skull fracture. No significant soft tissue swelling. 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 IMPRESSION: No acute intracranial process. Stable moderate to severe global brain atrophy and severe chronic small vessel ischemic disease. Old bifrontal infarcts within MCA territories and old LEFT thalamus lacunar infarct. Electronically Signed   By: Awilda Metro M.D.   On: 08/13/2015 22:14    EKG:  Independently reviewed. NSR  Assessment/Plan  Elevated trops:  Need to r/o ACS Will trend trops Initial EKG with NSR No known hx of CAD Cardiology consulted Given asp and started on statin Will check Echo NPO after 4 am  Other ddx: CHF, afib, stroke, mismatch  Elevated INR/AST/ALT:  Transaminase levels elevated previously Unclear etiology to me  Liver US in am Continue to monitor  Dementia: at baseline  AKI on CKD vs. Worsening CKD: continue IVF.  P.Afib: continue Xarelto   SSS: pt has a monitor. Cardio consulted. Outpatient fu.  Input & Output: NA Lines & Tubes:P.IV DVT prophylaxis: On xarelto  GI prophylaxis:PPI Consultants: Cardiology Code Status: DNR/DNI (per previous  admissions) Family Communication: none at bedside  Disposition Plan: Pearson Grippe M.D [facility]

## 2015-08-14 NOTE — [place] FL2 (Signed)
[place] MEDICAID FL2 LEVEL OF CARE SCREENING TOOL     IDENTIFICATION  Patient Name: Tracey [name] Birthdate: 03/21/1931 Sex: female Admission Date (Current Location): 08/13/2015  [facility] [place]   Facility and Address:  [facility],  [address], [place] [id number]      Provider Number: [id number]  Attending Physician Name and Address:  Clydia LlanoMutaz Elmahi, MD  Relative Name and Phone Number:       Current Level of Care: Hospital Recommended Level of Care: ALF   Prior Approval Number:    Date Approved/Denied:   PASRR Number:    Discharge Plan: ALF      Current Diagnoses: Patient Active Problem List   Diagnosis Date Noted  . Nausea 08/14/2015  . NSTEMI (non-ST elevated myocardial infarction) (HCC) 08/13/2015  . Paroxysmal atrial fibrillation (HCC) 02/11/2015  . Cryptogenic stroke (HCC) 01/28/2015  . Bradycardia, sinus   . Dehydration 01/03/2015  . Acute kidney injury (HCC) 01/03/2015  . Elevated transaminase level 01/03/2015  . Elevated troponin 01/03/2015  . Acid reflux 01/03/2015  . CKD (chronic kidney disease), stage III 01/03/2015  . Prolonged QT interval 01/03/2015  . History of CVA (cerebrovascular accident) 12/12/2014  . FTT (failure to thrive) in adult 12/11/2014  . Altered mental status 06/24/2014  . Hemiparesis and alteration of sensations as late effects of stroke (HCC) 06/24/2014  . 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 Dementia with Lewy bodies   . HLD (hyperlipidemia)   . Memory deficit 10/17/2012  . Abnormality of gait 10/17/2012    Orientation RESPIRATION BLADDER Height & Weight     Self, Place  Normal Incontinent Weight: 111 lb 15.9 oz (50.8 kg) Height:  5\' 5"  (165.1 cm)  BEHAVIORAL SYMPTOMS/MOOD NEUROLOGICAL BOWEL NUTRITION STATUS      Incontinent Regular  AMBULATORY STATUS COMMUNICATION OF NEEDS Skin   limited  Verbally Normal                       Personal Care  Assistance Level of Assistance  Bathing, Feeding, Dressing Bathing Assistance: Limited assistance Feeding assistance: Limited assistance Dressing Assistance: Limited assistance     Functional Limitations Info  Sight, Hearing, Speech Sight Info: Adequate Hearing Info: Adequate Speech Info: Adequate    SPECIAL CARE FACTORS FREQUENCY                       Contractures      Additional Factors Info  Code Status, Allergies, Psychotropic Code Status Info: DNR Allergies Info: Alendronate Sodium, Namenda, Simvastatin, Psychotropic Info: Prozac         Current Medications (08/14/2015):  This is the current hospital active medication list Current Facility-Administered Medications  Medication Dose Route Frequency Provider Last Rate Last Dose  . 0.9 %  sodium chloride infusion   Intravenous Continuous Clydia LlanoMutaz Elmahi, MD 100 mL/hr at 08/14/15 1053    . acetaminophen (TYLENOL) tablet 650 mg  650 mg Oral Q4H PRN Eston EstersAhmad Hamad, MD      . feeding supplement (BOOST HIGH PROTEIN) liquid 237 mL  1 Container Oral BID BM Eston EstersAhmad Hamad, MD      . FLUoxetine (PROZAC) capsule 10 mg  10 mg Oral Daily Eston EstersAhmad Hamad, MD   10 mg at 08/14/15 1051  . gi cocktail (Maalox,Lidocaine,Donnatal)  30 mL Oral QID PRN Eston EstersAhmad Hamad, MD      . loratadine (CLARITIN) tablet 10 mg  10 mg Oral  Daily Eston Esters, MD   10 mg at 08/14/15 1051  . pantoprazole (PROTONIX) injection 40 mg  40 mg Intravenous Q24H Eston Esters, MD   40 mg at 08/14/15 0048  . polyethylene glycol (MIRALAX / GLYCOLAX) packet 17 g  17 g Oral QODAY Eston Esters, MD      . Rivaroxaban (XARELTO) tablet 15 mg  15 mg Oral Daily Eston Esters, MD   15 mg at 08/14/15 1051  . rosuvastatin (CRESTOR) tablet 40 mg  40 mg Oral q1800 Eston Esters, MD   40 mg at 08/14/15 0236     Discharge Medications: Please see discharge summary for a list of discharge medications. START taking these medications   Details  cefUROXime (CEFTIN) 500 MG tablet Take 1 tablet (500 mg  total) by mouth 2 (two) times daily with a meal. Qty: 6 tablet, Refills: 0    lisinopril (PRINIVIL,ZESTRIL) 2.5 MG tablet Take 1 tablet (2.5 mg total) by mouth daily.      CONTINUE these medications which have NOT CHANGED   Details  acetaminophen (TYLENOL) 500 MG tablet Take 500 mg by mouth 2 (two) times daily. May also give q6hprn    acetaminophen-codeine (TYLENOL #2) 300-15 MG tablet Take 1 tablet by mouth daily.    clobetasol (TEMOVATE) 0.05 % external solution Apply 1 application topically daily. Use mon, tue, wed, thur, fri for itching    feeding supplement (BOOST HIGH PROTEIN) LIQD Take 1 Container by mouth 2 (two) times daily between meals.    FLUoxetine (PROZAC) 10 MG tablet Take 10 mg by mouth daily.    hydrocortisone cream 1 % Apply 1 application topically See admin instructions. Use mon, tue, wed, thu, fri    loratadine (CLARITIN) 10 MG tablet Take 10 mg by mouth daily.    Melatonin 3 MG CAPS Take 3 mg by mouth at bedtime.    Omega-3 Fatty Acids (FISH OIL) 1000 MG CAPS Take 1,000 mg by mouth daily.    omeprazole (PRILOSEC) 20 MG capsule Take 20 mg by mouth daily.    PENNSAID 2 % SOLN Place 2 application onto the skin 2 (two) times daily.    polyethylene glycol (MIRALAX / GLYCOLAX) packet Take 17 g by mouth every other day. Qty: 14 each, Refills: 0    promethazine (PHENERGAN) 25 MG tablet Take 25 mg by mouth every 8 (eight) hours as needed for nausea or vomiting.    Rivaroxaban (XARELTO) 15 MG TABS tablet Take 1 tablet (15 mg total) by mouth daily. Qty: 30 tablet, Refills: 6   Associated Diagnoses: Paroxysmal atrial fibrillation [facility])          Relevant Imaging Results:  Relevant Lab Results:   Additional Information : HHPT needed at ALF to to improve mobility. [id number]  Meyer Cory D, LCSW

## 2015-08-15 DIAGNOSIS — I480 Paroxysmal atrial fibrillation: Secondary | ICD-10-CM

## 2015-08-15 DIAGNOSIS — I5041 Acute combined systolic (congestive) and diastolic (congestive) heart failure: Secondary | ICD-10-CM

## 2015-08-15 DIAGNOSIS — N3000 Acute cystitis without hematuria: Secondary | ICD-10-CM

## 2015-08-15 DIAGNOSIS — R740 Nonspecific elevation of levels of transaminase and lactic acid dehydrogenase [LDH]: Secondary | ICD-10-CM

## 2015-08-15 DIAGNOSIS — I214 Non-ST elevation (NSTEMI) myocardial infarction: Principal | ICD-10-CM

## 2015-08-15 DIAGNOSIS — N179 Acute kidney failure, unspecified: Secondary | ICD-10-CM

## 2015-08-15 LAB — BASIC METABOLIC PANEL
Anion gap: 11 (ref 5–15)
BUN: 19 mg/dL (ref 6–20)
CALCIUM: 8.8 mg/dL — AB (ref 8.9–10.3)
CHLORIDE: 110 mmol/L (ref 101–111)
CO2: 20 mmol/L — AB (ref 22–32)
CREATININE: 0.96 mg/dL (ref 0.44–1.00)
GFR calc non Af Amer: 53 mL/min — ABNORMAL LOW (ref >60)
Glucose, Bld: 171 mg/dL — ABNORMAL HIGH (ref 65–99)
Potassium: 3.7 mmol/L (ref 3.5–5.1)
Sodium: 141 mmol/L (ref 135–145)

## 2015-08-15 MED ORDER — LISINOPRIL 2.5 MG PO TABS
2.5000 mg | ORAL_TABLET | Freq: Every day | ORAL | Status: DC
  Administered 2015-08-15 – 2015-08-17 (×3): 2.5 mg via ORAL
  Filled 2015-08-15 (×3): qty 1

## 2015-08-15 MED ORDER — PANTOPRAZOLE SODIUM 40 MG PO TBEC
40.0000 mg | DELAYED_RELEASE_TABLET | Freq: Every day | ORAL | Status: DC
  Administered 2015-08-15 – 2015-08-17 (×3): 40 mg via ORAL
  Filled 2015-08-15 (×4): qty 1

## 2015-08-15 MED ORDER — BOOST / RESOURCE BREEZE PO LIQD
1.0000 | Freq: Two times a day (BID) | ORAL | Status: DC
  Administered 2015-08-15 – 2015-08-17 (×5): 1 via ORAL

## 2015-08-15 NOTE — Progress Notes (Signed)
[place] [place] [facility] [place] [facility] [place] [facility] & per PT evaluation - patient appears at baseline. Judithann Saugerorsha states that patient has been using the wheelchair more frequently at ALF and has been needing increased assistance. ALF should be able to accept patient back when stable for discharge.  CSW following for [facility] when medically ready. CSW has completed FL2 & will continue to follow and assist with return.    Lincoln MaxinKelly Evanie Buckle, LCSW [facility] Clinical Social Worker cell #: [phone]

## 2015-08-15 NOTE — Evaluation (Signed)
Occupational Therapy Evaluation Patient Details Name: Tracey [name] MRN: [id number] DOB: 08-24-1930 Today's Date: 08/15/2015    History of Present Illness 82 yo female admitted with NSTEMI, fall. Hx of dementia with residual L sided weakness, gait imbalance, Alz, HTN, spinal stenosis, chronic back pain.    Clinical Impression   Pt was admitted for the above.  She is from ALF; unsure of baseline PLOF.  Pt reports she was mod I.  Pt currently needs min A overall. Goals in acute are for min guard level.    Follow Up Recommendations  SNF;Home health OT (vs at ALF.  Needs assistance/supervision for mobility)    Equipment Recommendations  None recommended by OT    Recommendations for Other Services       Precautions / Restrictions Precautions Precautions: Fall Restrictions Weight Bearing Restrictions: No      Mobility Bed Mobility Overal bed mobility: Needs Assistance Bed Mobility: Supine to Sit     Supine to sit: Supervision     General bed mobility comments: close guard for safety. Increased time.   Transfers Overall transfer level: Needs assistance Equipment used: Rolling walker (2 wheeled) Transfers: Sit to/from Stand Sit to Stand: Min assist         General transfer comment: Assist to rise, stabilize, control descent. VCs safety, hand placement    Balance Overall balance assessment: History of Falls;Needs assistance         Standing balance support: Bilateral upper extremity supported Standing balance-Leahy Scale: Poor Standing balance comment: requires walker                            ADL Overall ADL's : Needs assistance/impaired     Grooming: Min guard;Standing               Lower Body Dressing: Minimal assistance;Sit to/from stand                 General ADL Comments: Pt ambulated to sink to brush teeth.  Min guard given for safety.  Pt did not like our RW; she is used to 4 wheel walker.  Festination when walking.  Pt needs  min A for sit to stand for adls.  She can perform UB adls with set up.  Did not want to sit on 3:1 commode:  She has been incontinent.   At end of session, reoriented to call light and telephone.     Vision     Perception     Praxis      Pertinent Vitals/Pain Pain Assessment: Faces Faces Pain Scale: Hurts little more Pain Location: back Pain Descriptors / Indicators: Sore Pain Intervention(s): Monitored during session;Limited activity within patient's tolerance     Hand Dominance     Extremity/Trunk Assessment Upper Extremity Assessment Upper Extremity Assessment: Generalized weakness;LUE deficits/detail LUE Deficits / Details: limited to 90 degrees FF      Cervical / Trunk Assessment Cervical / Trunk Assessment: Normal   Communication Communication Communication: No difficulties   Cognition Arousal/Alertness: Awake/alert Behavior During Therapy: WFL for tasks assessed/performed Overall Cognitive Status: History of cognitive impairments - at baseline Area of Impairment: Memory               General Comments: h/o cognitive impairment; multimodal cues for safety   General Comments       Exercises       Shoulder Instructions      Home Living Family/patient expects to be discharged to:: Assisted  living     Type of Home: Assisted living [facility])                       Home Equipment: Dan HumphreysWalker - 4 wheels;Grab bars - toilet;Tub bench          Prior Functioning/Environment          Comments: pt states she was independent with adls but could call for assistance:  unable to verify    OT Diagnosis: Generalized weakness   OT Problem List: Decreased strength;Decreased activity tolerance;Impaired balance (sitting and/or standing);Decreased cognition;Decreased safety awareness;Pain   OT Treatment/Interventions: Self-care/ADL training;DME and/or AE instruction;Patient/family education;Balance training;Cognitive remediation/compensation     OT Goals(Current goals can be found in the care plan section) Acute Rehab OT Goals Patient Stated Goal: get stronger and get back to ALF OT Goal Formulation: With patient (general goal areas) Time For Goal Achievement: 08/22/15 Potential to Achieve Goals: Good ADL Goals Pt Will Transfer to Toilet: with min guard assist;ambulating;regular height toilet;grab bars Pt Will Perform Toileting - Clothing Manipulation and hygiene: with min guard assist;sitting/lateral leans Additional ADL Goal #1: pt will complete LB adls with min guard, sit to stand  OT Frequency: Min 2X/week   Barriers to D/C:            Co-evaluation              End of Session    Activity Tolerance: Patient tolerated treatment well Patient left: in bed;with call bell/phone within reach;with bed alarm set   Time: 8657-84691329-1348 OT Time Calculation (min): 19 min Charges:  OT General Charges $OT Visit: 1 Procedure OT Evaluation $OT Eval Moderate Complexity: 1 Procedure G-Codes:    Tracey [name] 08/15/2015, 2:41 PM Tracey [name], Tracey [name] [phone] 08/15/2015

## 2015-08-15 NOTE — Evaluation (Signed)
Physical Therapy Evaluation Patient Details Name: Chapman MossLou Felten MRN: [id number] DOB: 06/30/1930 Today's Date: 08/15/2015   History of Present Illness  82 yo female admitted with NSTEMI, fall. Hx of dementia with residual L sided weakness, gait imbalance, Alz, HTN, spinal stenosis, chronic back pain.   Clinical Impression  On eval, pt required Min assist for mobility-walked ~50 feet with RW. Noted Parkinson's-like gait pattern during session. Pt blamed standard RW for poor performance. Will try to practice with rollator (pt uses this at baseline) next session. No family present during session. Recommend ST rehab at SNF at this time.     Follow Up Recommendations SNF (unless ALF feels they can provide increased supervision/assist)    Equipment Recommendations  None recommended by PT    Recommendations for Other Services       Precautions / Restrictions Precautions Precautions: Fall Restrictions Weight Bearing Restrictions: No      Mobility  Bed Mobility Overal bed mobility: Needs Assistance Bed Mobility: Supine to Sit     Supine to sit: Min guard     General bed mobility comments: close guard for safety. Increased time.   Transfers Overall transfer level: Needs assistance Equipment used: Rolling walker (2 wheeled) Transfers: Sit to/from Stand Sit to Stand: Min assist         General transfer comment: Assist to rise, stabilize, control descent. VCs safety, hand placement  Ambulation/Gait Ambulation/Gait assistance: Min assist Ambulation Distance (Feet): 50 Feet Assistive device: Rolling walker (2 wheeled) Gait Pattern/deviations: Step-to pattern;Festinating;Decreased step length - right;Decreased step length - left;Decreased stride length     General Gait Details: Parkinson's-like gait pattern. Intermittent festination. Pt maintained step-to pattern despite cues for step-through techique. Pt blamed standard RW for poor performance. Will try to bring rollator on next  session to see if gait performance improves.   Stairs            Wheelchair Mobility    Modified Rankin (Stroke Patients Only)       Balance Overall balance assessment: History of Falls;Needs assistance         Standing balance support: Bilateral upper extremity supported;During functional activity Standing balance-Leahy Scale: Poor Standing balance comment: requires walker                             Pertinent Vitals/Pain Pain Assessment: Faces Faces Pain Scale: Hurts little more Pain Location: back Pain Descriptors / Indicators: Sore Pain Intervention(s): Monitored during session;Repositioned    Home Living Family/patient expects to be discharged to:: Assisted living     Type of Home: Assisted living [facility])         Home Equipment: Dan HumphreysWalker - 4 wheels;Grab bars - toilet;Tub bench      Prior Function                 Hand Dominance        Extremity/Trunk Assessment   Upper Extremity Assessment: Generalized weakness           Lower Extremity Assessment: Generalized weakness      Cervical / Trunk Assessment: Normal  Communication   Communication: No difficulties  Cognition Arousal/Alertness: Awake/alert Behavior During Therapy: WFL for tasks assessed/performed Overall Cognitive Status: History of cognitive impairments - at baseline                      General Comments      Exercises  Assessment/Plan    PT Assessment Patient needs continued PT services  PT Diagnosis Difficulty walking;Abnormality of gait;Generalized weakness   PT Problem List Decreased strength;Decreased balance;Decreased mobility;Decreased cognition;Decreased knowledge of use of DME;Decreased coordination  PT Treatment Interventions Gait training;DME instruction;Functional mobility training;Therapeutic activities;Patient/family education;Balance training;Therapeutic exercise   PT Goals (Current goals can be found in the  Care Plan section) Acute Rehab PT Goals Patient Stated Goal: to get up and move. get stronger PT Goal Formulation: With patient Time For Goal Achievement: 08/29/15 Potential to Achieve Goals: Fair    Frequency Min 3X/week   Barriers to discharge        Co-evaluation               End of Session Equipment Utilized During Treatment: Gait belt Activity Tolerance: Patient tolerated treatment well Patient left: in chair;with call bell/phone within reach;with chair alarm set           Time: 9604-5409 PT Time Calculation (min) (ACUTE ONLY): 16 min   Charges:   PT Evaluation $PT Eval Low Complexity: 1 Procedure     PT G Codes:        Rebeca Alert, MPT Pager: [phone]

## 2015-08-15 NOTE — Progress Notes (Signed)
Consult recommendations reviewed - I agree with this. Given her age, new significant cardiomyopathy, advanced dementia and not being a candidate for invasive procedures or pacemaker - palliative care would be very reasonable. Cardiology will sign-off. Call with questions.  Chrystie NoseKenneth C. Chandni Gagan, MD, [facility] Attending Cardiologist [facility]

## 2015-08-15 NOTE — Progress Notes (Signed)
CSW faxed over PT evaluation & progress note - awaiting response back whether they feel that they would be able to provide increased supervision/assistance at discharge.   CSW will follow-up.    Lincoln MaxinKelly Jamorris Ndiaye, LCSW [facility] Clinical Social Worker cell #: [phone]

## 2015-08-15 NOTE — Progress Notes (Signed)
PROGRESS NOTE  Tracey [name] ZOX:[id number] DOB: 02/15/1931 DOA: 08/13/2015 PCP: Darrow Bussing, MD Outpatient Specialists:    Subjective: Pleasantly confused, does not have any complaints this morning.  Brief Narrative: Patient is a 60 y/ofemale with history of stroke likely secondary to PAF (on Xarelto), dementia, and chronic back pain who was brought by her daughter with complaints of increasing weakness and 2 days of nausea and vomiting without fever, abdominal pain, diarrhea, or constipation. Per ED notes, she fell at 1700 on 11/12/2015 and has not been eating or drinking as per usual. In the ED troponin was noted to be elevated at 0.80 and EKG showed recent probable anteroseptal infarct. Per daughter, patient has not been having any recent chest pain or palpitations.   Assessment & Plan: Principal Problem:   NSTEMI (non-ST elevated myocardial infarction) (HCC) Active Problems:   Acute kidney injury (HCC)   Elevated transaminase level   Nausea   Acute combined systolic and diastolic heart failure (HCC)   PAF (paroxysmal atrial fibrillation) (HCC)   UTI (urinary tract infection)   Congestive dilated cardiomyopathy (HCC)  Elevated troponin/ acute combined heart failure - troponin 0.80 on 4/27, 0.63 today - EKG 04/27 showed NSR with LAFB, prolonged PR interval, and probable recent anteroseptal infarct - CXR showed stable mild cardiomegaly and COPD without acute cardiopulmonary process - Echo 04/28-  Left ventricle- systolic function was severely reduced. The estimated ejection fraction was in the range of 25% to 30%. Akinesis of the mid-apicalanteroseptal, anterior, anterolateral, and apical myocardium. Severe hypokinesis of the mid-apicalinferior myocardium. Features are consistent with a pseudonormal left ventricular filling pattern, with concomitant abnormal relaxation and increased filling pressure (grade 2 diastolic dysfunction). No evidence of thrombus.  - Echo today is severely  reduced in overall LV systolic function from previous echo (EF 60-65% without wall motion abnormalities in 04/2014). Concerning for multivessel CAD vs Takotsubo syndrome per echo, doubt Takotsubo with her age and history of ischemic cardiomyopathy - Cardiology evaluated the patient and recommended no aggressive intervention. -ACEI added, metoprolol contraindicated because history of bradycardia and suspicion of node dysfunction.  Urinary tract infection - UA with large leuk, positive nitrite, many bacteria, and TNTC WBCs - she denies any urinary symptoms, though with dementia and complaint of weakness will treat with Rocephin   AKI on CKD (stage III) - Creatinine 1.48, baseline 0.96 him on this is likely secondary to prerenal azotemia from poor oral intake. - Started on IV fluids, creatinine is back to normal on 4/29, discontinue IV fluids.  Generalized weakness and fall - Patient feels like she has been getting weaker for "months" - daughter says patient fell at 1700 on 04/27 - MRI brain without acute process - Unclear if advancing dementia vs. Polypharmacy vs. UTI - It is not an reasonable to start to talk about goals of care.  Elevated Transaminase - AST 205, ALT 245 - AST elevated to 58 during last admission in the setting of dehydration, improved after fluids - liver US pending - IVFs and monitor  Dementia - previously on Aricept and Namenda, both discontinued by her neurologist due to hallucinations and AMS.  - MMSE 16/30 at outpatient neurologist on 07/07/2015  PAF - NSR currently, CHA2DS2-VASc is at least 5 for CHF, female, HTN and 2 points for previous stroke. - On Xarelto, continued.  Chronic back pain - Tylenol PRN  Dyslipidemia - continue statin  Elevated INR - patient is on Xarelto for PAF    DVT prophylaxis: On anticoagulation for PAF, Xarelto Code  Status: DNR Family Communication: None at bedside Disposition Plan: Remain inpatint  Consultants:    Cardiology  Procedures:   2D echo 08/14/2015:  Left ventricle- systolic function was severely reduced. The estimated ejection fraction was in the range of 25% to 30%. Akinesis of the mid-apicalanteroseptal, anterior, anterolateral, and apical myocardium. Severe hypokinesis of the mid-apicalinferior myocardium. Features are consistent with a pseudonormal left ventricular filling pattern, with concomitant abnormal relaxation and increased filling pressure (grade 2 diastolic dysfunction). No evidence of thrombus.  MRI brain 08/13/2015   Antimicrobials:  Rocephin 04/28 >   Objective: Filed Vitals:   08/14/15 0201 08/14/15 1543 08/14/15 2008 08/15/15 0638  BP: 120/46 149/92 110/40 115/51  Pulse: 71 90 81 67  Temp: 97.9 F (36.6 C) 98.6 F (37 C) 98 F (36.7 C) 98.1 F (36.7 C)  TempSrc: Oral Oral Oral Oral  Resp: Height:  (1.651 m)     Weight: 50.8 kg (111 lb 15.9 oz)     SpO2: 96% 99% 99% 96%    Intake/Output Summary (Last 24 hours) at 08/15/15 1016 Last data filed at 08/15/15 0700  Gross per 24 hour  Intake 3636.67 ml  Output      0 ml  Net 3636.67 ml   Filed Weights   08/14/15 0201  Weight: 50.8 kg (111 lb 15.9 oz)    Examination: Constitutional: NAD, calm, comfortable Filed Vitals:   08/14/15 0201 08/14/15 1543 08/14/15 2008 08/15/15 0638  BP: 120/46 149/92 110/40 115/51  Pulse: 71 90 81 67  Temp: 97.9 F (36.6 C) 98.6 F (37 C) 98 F (36.7 C) 98.1 F (36.7 C)  TempSrc: Oral Oral Oral Oral  Resp: Height:  (1.651 m)     Weight: 50.8 kg (111 lb 15.9 oz)     SpO2: 96% 99% 99% 96%   General: Alert, awake, no acute distress.   Eyes: PERRL, lids and conjunctivae normal Neck: normal, supple, no masses, no thyromegaly Respiratory: clear to auscultation bilaterally, no wheezing, no crackles. Normal respiratory effort. No accessory muscle use. Cardiovascular: Regular rate and rhythm, no murmurs / rubs / gallops. No extremity  edema. 2+ pedal pulses. No JVD. Abdomen: no tenderness, no masses palpated. No hepatosplenomegaly. Bowel sounds positive.  Musculoskeletal: no clubbing / cyanosis, no edema. Skin: no rashes, lesions, ulcers. Neurologic: CN 2-12 grossly intact. Moving all extremities Psychiatric: Normal judgment and insight. Alert and oriented x 3. Flat affect.    Data Reviewed: I have personally reviewed following labs and imaging studies  CBC:  Recent Labs Lab 08/13/15 2148  WBC 10.2  HGB 11.0*  HCT 33.3*  MCV 87.4  PLT 228   Basic Metabolic Panel:  Recent Labs Lab 08/13/15 2148 08/15/15 0805  NA 136 141  K 3.8 3.7  CL 103 110  CO2 22 20*  GLUCOSE 150* 171*  BUN 43* 19  CREATININE 1.48* 0.96  CALCIUM 9.2 8.8*   GFR: Estimated Creatinine Clearance: 35.6 mL/min (by C-G formula based on Cr of 0.96). Liver Function Tests:  Recent Labs Lab 08/13/15 2148  AST 205*  ALT 245*  ALKPHOS 95  BILITOT 1.2  PROT 6.5  ALBUMIN 4.0   No results for input(s): LIPASE, AMYLASE in the last 168 hours. No results for input(s): AMMONIA in the last 168 hours. Coagulation Profile:  Recent Labs Lab 08/13/15 2148  INR 2.01*   Cardiac Enzymes:  Recent Labs Lab 08/13/15 2148 08/14/15 0441 08/14/15 1118  TROPONINI  0.80* 0.63* 0.48*   BNP (last 3 results) No results for input(s): PROBNP in the last 8760 hours. HbA1C: No results for input(s): HGBA1C in the last 72 hours. CBG: No results for input(s): GLUCAP in the last 168 hours. Lipid Profile: No results for input(s): CHOL, HDL, LDLCALC, TRIG, CHOLHDL, LDLDIRECT in the last 72 hours. Thyroid Function Tests: No results for input(s): TSH, T4TOTAL, FREET4, T3FREE, THYROIDAB in the last 72 hours. Anemia Panel: No results for input(s): VITAMINB12, FOLATE, FERRITIN, TIBC, IRON, RETICCTPCT in the last 72 hours. Urine analysis:    Component Value Date/Time   COLORURINE YELLOW 08/14/2015 0030   APPEARANCEUR CLOUDY* 08/14/2015 0030    LABSPEC 1.020 08/14/2015 0030   PHURINE 5.5 08/14/2015 0030   GLUCOSEU NEGATIVE 08/14/2015 0030   HGBUR TRACE* 08/14/2015 0030   BILIRUBINUR NEGATIVE 08/14/2015 0030   KETONESUR NEGATIVE 08/14/2015 0030   PROTEINUR 30* 08/14/2015 0030   UROBILINOGEN 0.2 01/03/2015 1316   NITRITE POSITIVE* 08/14/2015 0030   LEUKOCYTESUR LARGE* 08/14/2015 0030   Sepsis Labs: Invalid input(s): PROCALCITONIN, LACTICIDVEN  Recent Results (from the past 240 hour(s))  MRSA PCR Screening     Status: None   Collection Time: 08/14/15  2:12 AM  Result Value Ref Range Status   MRSA by PCR NEGATIVE NEGATIVE Final    Comment:        The GeneXpert MRSA Assay (FDA approved for NASAL specimens only), is one component of a comprehensive MRSA colonization surveillance program. It is not intended to diagnose MRSA infection nor to guide or monitor treatment for MRSA infections.       Radiology Studies: Dg Chest 2 View  08/13/2015  CLINICAL DATA:  Unwitnessed fall today 5 p.m. Altered mental status. History of hypertension, stroke. EXAM: CHEST  2 VIEW COMPARISON:  Chest radiograph January 03, 2015 FINDINGS: The cardiac silhouette is mildly enlarged, similar. Mediastinal silhouette is unremarkable. Mild chronic interstitial changes and increased lung volumes without pleural effusion or focal consolidation. Biapical pleural thickening is similar. No pneumothorax. Loop monitor projects over lower chest. Soft tissue planes and included osseous structures are nonsuspicious. IMPRESSION: Stable mild cardiomegaly and findings of COPD. No superimposed acute pulmonary process. Electronically Signed   By: Awilda Metro M.D.   On: 08/13/2015 22:07   Ct Head Wo Contrast  08/13/2015  CLINICAL DATA:  Unwitnessed fall today at 5 p.m. Altered mental status. Knee pain. History of Alzheimer's disease, hypertension, dyslipidemia. EXAM: CT HEAD WITHOUT CONTRAST TECHNIQUE: Contiguous axial images were obtained from the base of the  skull through the vertex without intravenous contrast. COMPARISON:  MRI head January 03, 2015 FINDINGS: INTRACRANIAL CONTENTS: Moderate to severe ventriculomegaly on the basis of global parenchymal brain volume loss. No intraparenchymal hemorrhage, mass effect nor midline shift. Confluent supratentorial white matter hypodensities. Bifrontal encephalomalacia. Old LEFT thalamus lacunar infarct. No acute large vascular territory infarcts. No abnormal extra-axial fluid collections. Basal cisterns are patent. Moderate calcific atherosclerosis of the carotid siphons. ORBITS: The included ocular globes and orbital contents are non-suspicious. SINUSES: Small RIGHT sphenoid mucosal retention cyst without paranasal sinus air-fluid levels. Mild ethmoid mucosal thickening. Mastoid air cells are well aerated. Soft tissue within the external auditory canals compatible with cerumen. SKULL/SOFT TISSUES: No skull fracture. No significant soft tissue swelling. 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 IMPRESSION: No acute intracranial process. Stable moderate to severe global brain atrophy and severe chronic small vessel ischemic disease. Old bifrontal infarcts within MCA territories and old LEFT thalamus lacunar infarct. Electronically Signed   By: Pernell Dupre  Bloomer M.D.   On: 08/13/2015 22:14     Scheduled Meds: . cefTRIAXone (ROCEPHIN)  IV  1 g Intravenous Q24H  . feeding supplement  1 Container Oral BID BM  . FLUoxetine  10 mg Oral Daily  . loratadine  10 mg Oral Daily  . pantoprazole (PROTONIX) IV  40 mg Intravenous Q24H  . polyethylene glycol  17 g Oral QODAY  . Rivaroxaban  15 mg Oral Daily  . rosuvastatin  40 mg Oral q1800   Continuous Infusions:    Time spent: 30 minutes  Lorin GlassKaylie Gonze PA-S wrote parts of this note   Kenslie Abbruzzese, A MD [facility]   If 7PM-7AM, please contact night-coverage [email or web address] Password [email or web address] 08/15/2015, 10:16 AM

## 2015-08-15 NOTE — Progress Notes (Signed)
Key Points: Use following P&T approved IV to PO antibiotic change [name].  Description contains the criteria that are approved Note: [name] Excludes:  Esophagectomy patientsPHARMACIST - PHYSICIAN COMMUNICATION DR:  Arthor CaptainElmahi CONCERNING: IV to Oral Route Change [name]  RECOMMENDATION: This patient is receiving Protonix by the intravenous route.  Based on criteria approved by the Pharmacy and Therapeutics Committee, the intravenous medication(s) is/are being converted to the equivalent oral dose form(s).   DESCRIPTION: These criteria include:  The patient is eating (either orally or via tube) and/or has been taking other orally administered medications for a least 24 hours  The patient has no evidence of active gastrointestinal bleeding or impaired GI absorption (gastrectomy, short bowel, patient on TNA or NPO).  If you have questions about this conversion, please contact the Pharmacy Department  []   [phone]( [phone] )  [facility] []   [phone]( [phone] )  [facility] []   [phone]( [phone] )  [facility] []   [phone] )  [facility] [x]   [phone]( [phone] )  [facility]    Charolotte Ekeom Brodi Nery, PharmD, pager [phone]. 08/15/2015,12:33 PM.

## 2015-08-16 LAB — BASIC METABOLIC PANEL
ANION GAP: 10 (ref 5–15)
BUN: 16 mg/dL (ref 6–20)
CALCIUM: 8.7 mg/dL — AB (ref 8.9–10.3)
CHLORIDE: 109 mmol/L (ref 101–111)
CO2: 21 mmol/L — ABNORMAL LOW (ref 22–32)
CREATININE: 0.92 mg/dL (ref 0.44–1.00)
GFR calc non Af Amer: 56 mL/min — ABNORMAL LOW (ref >60)
Glucose, Bld: 158 mg/dL — ABNORMAL HIGH (ref 65–99)
Potassium: 4 mmol/L (ref 3.5–5.1)
SODIUM: 140 mmol/L (ref 135–145)

## 2015-08-16 NOTE — Progress Notes (Signed)
PROGRESS NOTE  Tracey [name] ZOX:[id number] DOB: 10-24-1930 DOA: 08/13/2015 PCP: Darrow Bussing, MD Outpatient Specialists:    Subjective: Pleasantly confused, again no complaints today.  Brief Narrative: Patient is a 58 y/ofemale with history of stroke likely secondary to PAF (on Xarelto), dementia, and chronic back pain who was brought by her daughter with complaints of increasing weakness and 2 days of nausea and vomiting without fever, abdominal pain, diarrhea, or constipation. Per ED notes, she fell at 1700 on 11/12/2015 and has not been eating or drinking as per usual. In the ED troponin was noted to be elevated at 0.80 and EKG showed recent probable anteroseptal infarct. Per daughter, patient has not been having any recent chest pain or palpitations.   Assessment & Plan: Principal Problem:   NSTEMI (non-ST elevated myocardial infarction) (HCC) Active Problems:   Acute kidney injury (HCC)   Elevated transaminase level   Nausea   Acute combined systolic and diastolic heart failure (HCC)   PAF (paroxysmal atrial fibrillation) (HCC)   UTI (urinary tract infection)   Congestive dilated cardiomyopathy (HCC)   Elevated troponin/ acute combined heart failure - troponin 0.80 on 4/27, 0.63 today - EKG 04/27 showed NSR with LAFB, prolonged PR interval, and probable recent anteroseptal infarct - CXR showed stable mild cardiomegaly and COPD without acute cardiopulmonary process. - Echo done on 4/28 showed LVEF of 25-30%, grade 2 diastolic dysfunction. - This is a severe and remarkable change in LVEF, in January 2016 the LVEF was 60-65%. - In the interim likely patient developed ischemic heart disease, has multiple WMA seen in the echo. - Cardiology evaluated the patient and recommended no aggressive intervention. - ACEI added, metoprolol contraindicated because history of bradycardia and suspicion of node dysfunction.  Urinary tract infection - UA with large leuk, positive nitrite, many  bacteria, and TNTC WBCs - For some reason culture was not sent, continue Rocephin likely to discharge on Ceftin.   AKI on CKD (stage III) - Creatinine 1.48, baseline 0.96 him on this is likely secondary to prerenal azotemia from poor oral intake. - Started on IV fluids, creatinine is back to normal on 4/29, discontinue IV fluids.  Generalized weakness and fall - Patient feels like she has been getting weaker for "months" - daughter says patient fell at 1700 on 04/27 - MRI brain without acute process - Unclear if advancing dementia vs. Polypharmacy vs. UTI - It is not an reasonable to start to talk about goals of care.  Elevated Transaminase - AST 205, ALT 245 - AST elevated to 58 during last admission in the setting of dehydration, improved after fluids - liver US pending - IVFs and monitor  Dementia - previously on Aricept and Namenda, both discontinued by her neurologist due to hallucinations and AMS.  - MMSE 16/30 at outpatient neurologist on 07/07/2015  PAF - NSR currently, CHA2DS2-VASc is at least 5 for CHF, female, HTN and 2 points for previous stroke. - On Xarelto, continued.  Chronic back pain - Tylenol PRN  Dyslipidemia - continue statin  Elevated INR - patient is on Xarelto for PAF    DVT prophylaxis: On anticoagulation for PAF, Xarelto Code Status: DNR Family Communication: None at bedside Disposition Plan: Remain inpatint  Consultants:   Cardiology  Procedures:   2D echo 08/14/2015:  Left ventricle- systolic function was severely reduced. The estimated ejection fraction was in the range of 25% to 30%. Akinesis of the mid-apicalanteroseptal, anterior, anterolateral, and apical myocardium. Severe hypokinesis of the mid-apicalinferior myocardium. Features are consistent with  a pseudonormal left ventricular filling pattern, with concomitant abnormal relaxation and increased filling pressure (grade 2 diastolic dysfunction). No evidence of thrombus.  MRI  brain 08/13/2015   Antimicrobials:  Rocephin 04/28 >   Objective: Filed Vitals:   08/15/15 0638 08/15/15 1430 08/15/15 2221 08/16/15 0638  BP: 115/51 120/58 124/53 108/52  Pulse: 67 65 71 66  Temp: 98.1 F (36.7 C) 98.1 F (36.7 C) 98.1 F (36.7 C) 98.4 F (36.9 C)  TempSrc: Oral Oral Oral Oral  Resp: 18 20 20 18   Height:      Weight:      SpO2: 96% 100% 100% 100%   No intake or output data in the 24 hours ending 08/16/15 1029 Filed Weights   08/14/15 0201  Weight: 50.8 kg (111 lb 15.9 oz)    Examination: Constitutional: NAD, calm, comfortable Filed Vitals:   08/15/15 0638 08/15/15 1430 08/15/15 2221 08/16/15 0638  BP: 115/51 120/58 124/53 108/52  Pulse: 67 65 71 66  Temp: 98.1 F (36.7 C) 98.1 F (36.7 C) 98.1 F (36.7 C) 98.4 F (36.9 C)  TempSrc: Oral Oral Oral Oral  Resp: 18 20 20 18   Height:      Weight:      SpO2: 96% 100% 100% 100%   General: Alert, awake, no acute distress.   Eyes: PERRL, lids and conjunctivae normal Neck: normal, supple, no masses, no thyromegaly Respiratory: clear to auscultation bilaterally, no wheezing, no crackles. Normal respiratory effort. No accessory muscle use. Cardiovascular: Regular rate and rhythm, no murmurs / rubs / gallops. No extremity edema. 2+ pedal pulses. No JVD. Abdomen: no tenderness, no masses palpated. No hepatosplenomegaly. Bowel sounds positive.  Musculoskeletal: no clubbing / cyanosis, no edema. Skin: no rashes, lesions, ulcers. Neurologic: CN 2-12 grossly intact. Moving all extremities Psychiatric: Normal judgment and insight. Alert and oriented x 3. Flat affect.    Data Reviewed: I have personally reviewed following labs and imaging studies  CBC:  Recent Labs Lab 08/13/15 2148  WBC 10.2  HGB 11.0*  HCT 33.3*  MCV 87.4  PLT 228   Basic Metabolic Panel:  Recent Labs Lab 08/13/15 2148 08/15/15 0805 08/16/15 0507  NA 136 141 140  K 3.8 3.7 4.0  CL 103 110 109  CO2 22 20* 21*  GLUCOSE  150* 171* 158*  BUN 43* 19 16  CREATININE 1.48* 0.96 0.92  CALCIUM 9.2 8.8* 8.7*   GFR: Estimated Creatinine Clearance: 37.2 mL/min (by C-G formula based on Cr of 0.92). Liver Function Tests:  Recent Labs Lab 08/13/15 2148  AST 205*  ALT 245*  ALKPHOS 95  BILITOT 1.2  PROT 6.5  ALBUMIN 4.0   No results for input(s): LIPASE, AMYLASE in the last 168 hours. No results for input(s): AMMONIA in the last 168 hours. Coagulation Profile:  Recent Labs Lab 08/13/15 2148  INR 2.01*   Cardiac Enzymes:  Recent Labs Lab 08/13/15 2148 08/14/15 0441 08/14/15 1118  TROPONINI 0.80* 0.63* 0.48*   BNP (last 3 results) No results for input(s): PROBNP in the last 8760 hours. HbA1C: No results for input(s): HGBA1C in the last 72 hours. CBG: No results for input(s): GLUCAP in the last 168 hours. Lipid Profile: No results for input(s): CHOL, HDL, LDLCALC, TRIG, CHOLHDL, LDLDIRECT in the last 72 hours. Thyroid Function Tests: No results for input(s): TSH, T4TOTAL, FREET4, T3FREE, THYROIDAB in the last 72 hours. Anemia Panel: No results for input(s): VITAMINB12, FOLATE, FERRITIN, TIBC, IRON, RETICCTPCT in the last 72 hours. Urine analysis:  Component Value Date/Time   COLORURINE YELLOW 08/14/2015 0030   APPEARANCEUR CLOUDY* 08/14/2015 0030   LABSPEC 1.020 08/14/2015 0030   PHURINE 5.5 08/14/2015 0030   GLUCOSEU NEGATIVE 08/14/2015 0030   HGBUR TRACE* 08/14/2015 0030   BILIRUBINUR NEGATIVE 08/14/2015 0030   KETONESUR NEGATIVE 08/14/2015 0030   PROTEINUR 30* 08/14/2015 0030   UROBILINOGEN 0.2 01/03/2015 1316   NITRITE POSITIVE* 08/14/2015 0030   LEUKOCYTESUR LARGE* 08/14/2015 0030   Sepsis Labs: Invalid input(s): PROCALCITONIN, LACTICIDVEN  Recent Results (from the past 240 hour(s))  MRSA PCR Screening     Status: None   Collection Time: 08/14/15  2:12 AM  Result Value Ref Range Status   MRSA by PCR NEGATIVE NEGATIVE Final    Comment:        The GeneXpert MRSA Assay  (FDA approved for NASAL specimens only), is one component of a comprehensive MRSA colonization surveillance program. It is not intended to diagnose MRSA infection nor to guide or monitor treatment for MRSA infections.       Radiology Studies: No results found.   Scheduled Meds: . cefTRIAXone (ROCEPHIN)  IV  1 g Intravenous Q24H  . feeding supplement  1 Container Oral BID BM  . FLUoxetine  10 mg Oral Daily  . lisinopril  2.5 mg Oral Daily  . loratadine  10 mg Oral Daily  . pantoprazole  40 mg Oral QAC breakfast  . polyethylene glycol  17 g Oral QODAY  . Rivaroxaban  15 mg Oral Daily  . rosuvastatin  40 mg Oral q1800   Continuous Infusions:    Time spent: 30 minutes  Lorin Glass PA-S wrote parts of this note   Younique Casad, A MD [facility]   If 7PM-7AM, please contact night-coverage [email or web address] Password [facility] 08/16/2015, 10:29 AM

## 2015-08-16 NOTE — Progress Notes (Signed)
This nurse assumed care of patient at 1300. Agree with previous RN's assessment. 

## 2015-08-17 LAB — BASIC METABOLIC PANEL
ANION GAP: 10 (ref 5–15)
BUN: 15 mg/dL (ref 6–20)
CO2: 20 mmol/L — ABNORMAL LOW (ref 22–32)
Calcium: 8.5 mg/dL — ABNORMAL LOW (ref 8.9–10.3)
Chloride: 108 mmol/L (ref 101–111)
Creatinine, Ser: 0.91 mg/dL (ref 0.44–1.00)
GFR calc Af Amer: >60 mL/min (ref >60)
GFR, EST NON AFRICAN AMERICAN: 57 mL/min — AB (ref >60)
Glucose, Bld: 136 mg/dL — ABNORMAL HIGH (ref 65–99)
POTASSIUM: 3.9 mmol/L (ref 3.5–5.1)
SODIUM: 138 mmol/L (ref 135–145)

## 2015-08-17 MED ORDER — CEFUROXIME AXETIL 500 MG PO TABS: 500.0000 mg | ORAL_TABLET | Freq: Two times a day (BID) | ORAL | Status: DC

## 2015-08-17 MED ORDER — CEFUROXIME AXETIL 500 MG PO TABS: 500.0000 mg | ORAL_TABLET | Freq: Two times a day (BID) | ORAL | Status: AC

## 2015-08-17 MED ORDER — LISINOPRIL 2.5 MG PO TABS: 2.5000 mg | ORAL_TABLET | Freq: Every day | ORAL | Status: DC

## 2015-08-17 NOTE — Care Management Important Message (Signed)
Important Message  Patient Details  Name: Tracey [name] MRN: [id number] Date of Birth: 10/30/1930   Medicare Important Message Given:  Yes    Haskell FlirtJamison, Dalayla Aldredge 08/17/2015, 9:37 AMImportant Message  Patient Details  Name: Tracey [name] MRN: [id number] Date of Birth: 06/25/1930   Medicare Important Message Given:  Yes    Haskell FlirtJamison, Jannette Cotham 08/17/2015, 9:37 AM

## 2015-08-17 NOTE — Care Management Note (Signed)
Case Management Note  Patient Details  Name: Tracey [name] MRN: [id number] Date of Birth: 01/25/1931  Subjective/Objective[facility]  [facility] CSW following for return.                  Action/Plan:d/c back to ALF.   Expected Discharge Date:                  Expected Discharge Plan:  Skilled Nursing Facility  In-House Referral:  Clinical Social Work  Discharge planning Services  CM Consult  Post Acute Care Choice:    Choice offered to:     DME Arranged:    DME Agency:     HH Arranged:    HH Agency:     Status of Service:  Completed, signed off  Medicare Important Message Given:  Yes Date Medicare IM Given:    Medicare IM give by:    Date Additional Medicare IM Given:    Additional Medicare Important Message give by:     If discussed at Long Length of Stay Meetings, dates discussed:    Additional Comments:  Lanier ClamMahabir, Eliyohu Class, RN 08/17/2015, 1:00 PM

## 2015-08-17 NOTE — Progress Notes (Signed)
Physical Therapy Treatment Patient Details Name: Tracey MossLou [name] MRN: [id number] DOB: 09/01/1930 Today's Date: 08/17/2015    History of Present Illness 82 yo female admitted with NSTEMI, fall. Hx of dementia with residual L sided weakness, gait imbalance, Alz, HTN, spinal stenosis, chronic back pain.     PT Comments    Pt assisted OOB to recliner.  Pt reports weakness however not agreeable to ambulate today.  Follow Up Recommendations  SNF (vs ALF (if they can provide assist))     Equipment Recommendations  None recommended by PT    Recommendations for Other Services       Precautions / Restrictions Precautions Precautions: Fall    Mobility  Bed Mobility Overal bed mobility: Needs Assistance Bed Mobility: Supine to Sit     Supine to sit: Supervision     General bed mobility comments: NT -- up in recliner  Transfers Overall transfer level: Needs assistance Equipment used: Rolling walker (2 wheeled) Transfers: Sit to/from [employer] CorporationStand;Stand Pivot Transfers Sit to Stand: Min assist Stand pivot transfers: Min assist       General transfer comment: assist to rise and steady, verbal cues for safe technique  Ambulation/Gait Ambulation/Gait assistance:  (refused ambulation)               Stairs            Wheelchair Mobility    Modified Rankin (Stroke Patients Only)       Balance Overall balance assessment: History of Falls                                  Cognition Arousal/Alertness: Awake/alert Behavior During Therapy: WFL for tasks assessed/performed Overall Cognitive Status: History of cognitive impairments - at baseline Area of Impairment: Memory                    Exercises      General Comments        Pertinent Vitals/Pain Pain Assessment: Faces Faces Pain Scale: Hurts little more Pain Location: back Pain Descriptors / Indicators: Sore Pain Intervention(s): Limited activity within patient's tolerance;Monitored during  session;Repositioned    Home Living                      Prior Function            PT Goals (current goals can now be found in the care plan section) Progress towards PT goals: Progressing toward goals    Frequency  Min 3X/week    PT Plan Current plan remains appropriate    Co-evaluation             End of Session Equipment Utilized During Treatment: Gait belt Activity Tolerance: Patient tolerated treatment well Patient left: in chair;with call bell/phone within reach;with chair alarm set     Time: 1051-1104 PT Time Calculation (min) (ACUTE ONLY): 13 min  Charges:  $Therapeutic Activity: 8-22 mins                    G Codes:      Hommer Cunliffe,KATHrine E 08/17/2015, 11:44 AM Zenovia JarredKati Timmy Bubeck, PT, DPT 08/17/2015 Pager: [phone]

## 2015-08-17 NOTE — Progress Notes (Signed)
[facility]. [place] ( NSG ) from ALF confirmed that facility is able to manage pt's care at the ALF level . Daughter / pt were in agreement with this plan. Daughter provided transportation to facility. FL2 / D/C Summary sent to ALF for review prior to d/c. D/C packet provided to pt. Scripts included in d/c packet. # for report provided to nsg.  Cori RazorJamie Leeam Cedrone LCSW [phone]

## 2015-08-17 NOTE — Progress Notes (Signed)
Occupational Therapy Treatment Patient Details Name: Tracey [name] Seeberger MRN: [id number] DOB: 08/07/1930 Today's Date: 08/17/2015    History of present illness 82 yo female admitted with NSTEMI, fall. Hx of dementia with residual L sided weakness, gait imbalance, Alz, HTN, spinal stenosis, chronic back pain.    OT comments  Limited session today due to patient focused on getting meal tray. OT will continue to follow.  Follow Up Recommendations  Home health OT (at ALF)    Equipment Recommendations  None recommended by OT    Recommendations for Other Services      Precautions / Restrictions Precautions Precautions: Fall       Mobility Bed Mobility               General bed mobility comments: NT -- up in recliner  Transfers                 General transfer comment: pt refused mobility tasks during session    Balance                                   ADL Overall ADL's : Needs assistance/impaired     Grooming: Wash/dry hands;Wash/dry face;Set up;Sitting                                 General ADL Comments: Patient up in recliner. C/o "not comfortable." Assisted patient with repositioning in recliner. Patient participated in simple grooming tasks from seated position. Did not want to get up at this time. Focused on when meal tray would be delivered only. Attempted to assist patient in ordering lunch but she refused.       Vision                     Perception     Praxis      Cognition   Behavior During Therapy: WFL for tasks assessed/performed Overall Cognitive Status: History of cognitive impairments - at baseline Area of Impairment: Memory                     Extremity/Trunk Assessment               Exercises     Shoulder Instructions       General Comments      Pertinent Vitals/ Pain       Pain Assessment: No/denies pain  Home Living                                           Prior Functioning/Environment              Frequency Min 2X/week     Progress Toward Goals  OT Goals(current goals can now be found in the care plan section)  Progress towards OT goals: Not progressing toward goals - comment (limited session today)     Plan Discharge plan remains appropriate    Co-evaluation                 End of Session     Activity Tolerance     Patient Left     Nurse Communication          Time: [id number] OT Time Calculation (min): 10 min  Charges:  OT General Charges $OT Visit: 1 Procedure OT Treatments $Self Care/Home Management : 8-22 mins  Kynzli Rease A 08/17/2015, 11:35 AM

## 2015-08-17 NOTE — Discharge Summary (Signed)
Physician Discharge Summary  Tracey [name] WGN:[id number] DOB: 04-10-1931 DOA: 08/13/2015  PCP: Tracey Bussing, MD  Admit date: 08/13/2015 Discharge date: 08/17/2015  Time spent: 40 minutes  Recommendations for Outpatient Follow-up:  1. Follow up with SNF MD. 2. Ceftin for 3 more days. 3. Please consider to consult palliative care for goals of care.   Discharge Diagnoses:  Principal Problem:   NSTEMI (non-ST elevated myocardial infarction) (HCC) Active Problems:   Acute kidney injury (HCC)   Elevated transaminase level   Nausea   Acute combined systolic and diastolic heart failure (HCC)   PAF (paroxysmal atrial fibrillation) (HCC)   UTI (urinary tract infection)   Congestive dilated cardiomyopathy [facility])   Discharge Condition: Stable  Diet recommendation: Heart healthy  Filed Weights   08/14/15 0201 08/17/15 0507  Weight: 50.8 kg (111 lb 15.9 oz) 55.6 kg (122 lb 9.2 oz)   History of present illness:  Patient is a 82 yo Female with history of cryptogenic stroke, dementia,possible afib who was brought by her daughter with cc of nausea for 2 days with 2 vomiting episodes without abdominal pain,diarrhea or constipation. She denied chest pain ,dyspnea , fever, chills,cough. She does not remember her medications but she denied history of MI. She said she feels she can go home.    Hospital Course:   Acute combined heart failure/elevated troponin - Troponin 0.80 on 4/27, 0.63 today - EKG 04/27 showed NSR with LAFB, prolonged PR interval, and probable recent anteroseptal infarct - CXR showed stable mild cardiomegaly and COPD without acute cardiopulmonary process. - Echo done on 4/28 showed LVEF of 25-30%, grade 2 diastolic dysfunction. - This is a severe and remarkable change in LVEF, in January 2016 the LVEF was 60-65%. - In the interim likely patient developed ischemic heart disease, has multiple WMA seen in the echo. - ACEI added, metoprolol contraindicated because history of  bradycardia and suspicion of node dysfunction. - Cardiology recommended no aggressive intervention, suggest to consult palliative care to meet family about goals of care.  Urinary tract infection - UA with large leuk, positive nitrite, many bacteria, and TNTC WBCs - For some reason culture was not sent, was on Rocephin, discharged on Ceftin for 3 more days.  AKI on CKD (stage III) - Creatinine 1.48, baseline 0.96 him on this is likely secondary to prerenal azotemia from poor oral intake. - This is resolved with IV fluids.  Generalized weakness and fall - Patient feels like she has been getting weaker for "months" - daughter says patient fell at 1700 on 04/27 - MRI brain without acute process - Unclear if advancing dementia vs. Polypharmacy vs. UTI - It is not an reasonable to start to talk about goals of care.  Elevated Transaminase - AST 205, ALT 245 - AST elevated to 58 during last admission in the setting of dehydration, improved after fluids - Follow as outpatient, no aggressive intervention for now or further testing.  Dementia - previously on Aricept and Namenda, both discontinued by her neurologist due to hallucinations and AMS.  - MMSE 16/30 at outpatient neurologist on 07/07/2015  PAF - NSR currently, CHA2DS2-VASc is at least 5 for CHF, female, HTN and 2 points for previous stroke. - On Xarelto, continued.  Chronic back pain - Tylenol PRN  Dyslipidemia - continue statin  Elevated INR - Likely secondary to the Xarelto.   Procedures:  None.  Consultations:  Cardiology   Discharge Exam: Filed Vitals:   08/16/15 1954 08/17/15 0507  BP: 121/90   Pulse: 84  63  Temp: 98.1 F (36.7 C) 98.4 F (36.9 C)  Resp: 20 20   General: Alert and awake, oriented x3, not in any acute distress. HEENT: anicteric sclera, pupils reactive to light and accommodation, EOMI CVS: S1-S2 clear, no murmur rubs or gallops Chest: clear to auscultation bilaterally, no wheezing,  rales or rhonchi Abdomen: soft nontender, nondistended, normal bowel sounds, no organomegaly Extremities: no cyanosis, clubbing or edema noted bilaterally Neuro: Cranial nerves II-XII intact, no focal neurological deficits  Discharge Instructions   Discharge Instructions    Diet - low sodium heart healthy    Complete by:  As directed      Increase activity slowly    Complete by:  As directed           Current Discharge Medication List    START taking these medications   Details  cefUROXime (CEFTIN) 500 MG tablet Take 1 tablet (500 mg total) by mouth 2 (two) times daily with a meal. Qty: 6 tablet, Refills: 0    lisinopril (PRINIVIL,ZESTRIL) 2.5 MG tablet Take 1 tablet (2.5 mg total) by mouth daily.      CONTINUE these medications which have NOT CHANGED   Details  acetaminophen (TYLENOL) 500 MG tablet Take 500 mg by mouth 2 (two) times daily. May also give q6hprn    acetaminophen-codeine (TYLENOL #2) 300-15 MG tablet Take 1 tablet by mouth daily.    clobetasol (TEMOVATE) 0.05 % external solution Apply 1 application topically daily. Use mon, tue, wed, thur, fri for itching    feeding supplement (BOOST HIGH PROTEIN) LIQD Take 1 Container by mouth 2 (two) times daily between meals.    FLUoxetine (PROZAC) 10 MG tablet Take 10 mg by mouth daily.    hydrocortisone cream 1 % Apply 1 application topically See admin instructions. Use mon, tue, wed, thu, fri    loratadine (CLARITIN) 10 MG tablet Take 10 mg by mouth daily.    Melatonin 3 MG CAPS Take 3 mg by mouth at bedtime.    Omega-3 Fatty Acids (FISH OIL) 1000 MG CAPS Take 1,000 mg by mouth daily.    omeprazole (PRILOSEC) 20 MG capsule Take 20 mg by mouth daily.    PENNSAID 2 % SOLN Place 2 application onto the skin 2 (two) times daily.    polyethylene glycol (MIRALAX / GLYCOLAX) packet Take 17 g by mouth every other day. Qty: 14 each, Refills: 0    promethazine (PHENERGAN) 25 MG tablet Take 25 mg by mouth every 8 (eight)  hours as needed for nausea or vomiting.    Rivaroxaban (XARELTO) 15 MG TABS tablet Take 1 tablet (15 mg total) by mouth daily. Qty: 30 tablet, Refills: 6   Associated Diagnoses: Paroxysmal atrial fibrillation (HCC)       Allergies  Allergen Reactions  . Alendronate Sodium     unknown  . Namenda [Memantine Hcl] Other (See Comments)    halucinations  . Simvastatin     unknown      The results of significant diagnostics from this hospitalization (including imaging, microbiology, ancillary and laboratory) are listed below for reference.    Significant Diagnostic Studies: Dg Chest 2 View  08/13/2015  CLINICAL DATA:  Unwitnessed fall today 5 p.m. Altered mental status. History of hypertension, stroke. EXAM: CHEST  2 VIEW COMPARISON:  Chest radiograph January 03, 2015 FINDINGS: The cardiac silhouette is mildly enlarged, similar. Mediastinal silhouette is unremarkable. Mild chronic interstitial changes and increased lung volumes without pleural effusion or focal consolidation. Biapical pleural thickening  is similar. No pneumothorax. Loop monitor projects over lower chest. Soft tissue planes and included osseous structures are nonsuspicious. IMPRESSION: Stable mild cardiomegaly and findings of COPD. No superimposed acute pulmonary process. Electronically Signed   By: Awilda Metro M.D.   On: 08/13/2015 22:07   Ct Head Wo Contrast  08/13/2015  CLINICAL DATA:  Unwitnessed fall today at 5 p.m. Altered mental status. Knee pain. History of Alzheimer's disease, hypertension, dyslipidemia. EXAM: CT HEAD WITHOUT CONTRAST TECHNIQUE: Contiguous axial images were obtained from the base of the skull through the vertex without intravenous contrast. COMPARISON:  MRI head January 03, 2015 FINDINGS: INTRACRANIAL CONTENTS: Moderate to severe ventriculomegaly on the basis of global parenchymal brain volume loss. No intraparenchymal hemorrhage, mass effect nor midline shift. Confluent supratentorial white  matter hypodensities. Bifrontal encephalomalacia. Old LEFT thalamus lacunar infarct. No acute large vascular territory infarcts. No abnormal extra-axial fluid collections. Basal cisterns are patent. Moderate calcific atherosclerosis of the carotid siphons. ORBITS: The included ocular globes and orbital contents are non-suspicious. SINUSES: Small RIGHT sphenoid mucosal retention cyst without paranasal sinus air-fluid levels. Mild ethmoid mucosal thickening. Mastoid air cells are well aerated. Soft tissue within the external auditory canals compatible with cerumen. SKULL/SOFT TISSUES: No skull fracture. No significant soft tissue swelling. 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 IMPRESSION: No acute intracranial process. Stable moderate to severe global brain atrophy and severe chronic small vessel ischemic disease. Old bifrontal infarcts within MCA territories and old LEFT thalamus lacunar infarct. Electronically Signed   By: Awilda Metro M.D.   On: 08/13/2015 22:14    Microbiology: Recent Results (from the past 240 hour(s))  MRSA PCR Screening     Status: None   Collection Time: 08/14/15  2:12 AM  Result Value Ref Range Status   MRSA by PCR NEGATIVE NEGATIVE Final    Comment:        The GeneXpert MRSA Assay (FDA approved for NASAL specimens only), is one component of a comprehensive MRSA colonization surveillance program. It is not intended to diagnose MRSA infection nor to guide or monitor treatment for MRSA infections.      Labs: Basic Metabolic Panel:  Recent Labs Lab 08/13/15 2148 08/15/15 0805 08/16/15 0507 08/17/15 0502  NA 136 141 140 138  K 3.8 3.7 4.0 3.9  CL 103 110 109 108  CO2 22 20* 21* 20*  GLUCOSE 150* 171* 158* 136*  BUN 43* 19 16 15   CREATININE 1.48* 0.96 0.92 0.91  CALCIUM 9.2 8.8* 8.7* 8.5*   Liver Function Tests:  Recent Labs Lab 08/13/15 2148  AST 205*  ALT 245*  ALKPHOS 95  BILITOT 1.2  PROT 6.5  ALBUMIN 4.0   No  results for input(s): LIPASE, AMYLASE in the last 168 hours. No results for input(s): AMMONIA in the last 168 hours. CBC:  Recent Labs Lab 08/13/15 2148  WBC 10.2  HGB 11.0*  HCT 33.3*  MCV 87.4  PLT 228   Cardiac Enzymes:  Recent Labs Lab 08/13/15 2148 08/14/15 0441 08/14/15 1118  TROPONINI 0.80* 0.63* 0.48*   BNP: BNP (last 3 results) No results for input(s): BNP in the last 8760 hours.  ProBNP (last 3 results) No results for input(s): PROBNP in the last 8760 hours.  CBG: No results for input(s): GLUCAP in the last 168 hours.     Signed:  Clint Lipps MD.  [facility] 08/17/2015, 10:28 AM

## 2015-08-25 ENCOUNTER — Telehealth: Payer: Self-pay | Admitting: Internal Medicine

## 2015-08-25 NOTE — Telephone Encounter (Signed)
Pt's daughter Chip BoerVicki calling re pt having severe left shoulder pain since last night-they are at the orthopedic office now-pt denies chest pain-pressure-SOB-nausea-vomiting-a little dizzy and lightheaded -she is going to get an injection-I asked if they were doing an xray and she said she would request one-dtr wants to know if there is anything we need to do on our end for her

## 2015-08-25 NOTE — Telephone Encounter (Signed)
Spoke with daughter and let her know I did not think anything we need to do from a cardiac standpoint. They are at ortho now

## 2015-09-03 ENCOUNTER — Ambulatory Visit (INDEPENDENT_AMBULATORY_CARE_PROVIDER_SITE_OTHER): Payer: Medicare Other | Admitting: *Deleted

## 2015-09-03 DIAGNOSIS — I639 Cerebral infarction, unspecified: Secondary | ICD-10-CM

## 2015-09-04 NOTE — Progress Notes (Signed)
Carelink Summary Report / Loop Recorder 

## 2015-09-12 LAB — CUP PACEART REMOTE DEVICE CHECK: MDC IDC SESS DTM: 20170319213853

## 2015-09-12 NOTE — Progress Notes (Signed)
Carelink summary report received. Battery status OK. Normal device function. No new symptom episodes, tachy episodes. 0.4% AF, +Xarelto.  Huston FoleyBrady and pause episodes are chronic and asymptomatic. Monthly summary reports and ROV/PRN

## 2015-09-14 LAB — CUP PACEART REMOTE DEVICE CHECK: MDC IDC SESS DTM: 20170418220645

## 2015-09-14 NOTE — Progress Notes (Signed)
Carelink summary report received. Battery status OK. Normal device function. No new symptom episodes, tachy episodes. 0.4% AF, +Xarelto. Huston FoleyBrady and pause episodes are chronic and asymptomatic. Monthly summary reports and ROV/PRN

## 2015-10-05 ENCOUNTER — Ambulatory Visit (INDEPENDENT_AMBULATORY_CARE_PROVIDER_SITE_OTHER): Payer: Medicare Other | Admitting: *Deleted

## 2015-10-05 DIAGNOSIS — I639 Cerebral infarction, unspecified: Secondary | ICD-10-CM

## 2015-10-05 NOTE — Progress Notes (Signed)
Carelink Summary Report / Loop Recorder 

## 2015-10-12 LAB — CUP PACEART REMOTE DEVICE CHECK
Date Time Interrogation Session: 20170518223629
Date Time Interrogation Session: 20170617223723

## 2015-11-02 ENCOUNTER — Ambulatory Visit (INDEPENDENT_AMBULATORY_CARE_PROVIDER_SITE_OTHER): Payer: Medicare Other | Admitting: *Deleted

## 2015-11-02 DIAGNOSIS — I639 Cerebral infarction, unspecified: Secondary | ICD-10-CM | POA: Diagnosis not present

## 2015-11-03 NOTE — Progress Notes (Signed)
Carelink Summary Report / Loop Recorder 

## 2015-11-21 ENCOUNTER — Emergency Department (HOSPITAL_COMMUNITY): Payer: Medicare Other

## 2015-11-21 ENCOUNTER — Emergency Department (HOSPITAL_COMMUNITY)
Admission: EM | Admit: 2015-11-21 | Discharge: 2015-11-22 | Disposition: A | Discharge status: Home / Self Care | Payer: Medicare Other | Attending: Emergency Medicine | Admitting: Emergency Medicine

## 2015-11-21 ENCOUNTER — Encounter (HOSPITAL_COMMUNITY): Payer: Self-pay | Admitting: *Deleted

## 2015-11-21 DIAGNOSIS — E860 Dehydration: Secondary | ICD-10-CM | POA: Insufficient documentation

## 2015-11-21 DIAGNOSIS — I252 Old myocardial infarction: Secondary | ICD-10-CM | POA: Insufficient documentation

## 2015-11-21 DIAGNOSIS — N183 Chronic kidney disease, stage 3 (moderate): Secondary | ICD-10-CM | POA: Diagnosis not present

## 2015-11-21 DIAGNOSIS — Z8673 Personal history of transient ischemic attack (TIA), and cerebral infarction without residual deficits: Secondary | ICD-10-CM | POA: Insufficient documentation

## 2015-11-21 DIAGNOSIS — I5041 Acute combined systolic (congestive) and diastolic (congestive) heart failure: Secondary | ICD-10-CM | POA: Insufficient documentation

## 2015-11-21 DIAGNOSIS — I130 Hypertensive heart and chronic kidney disease with heart failure and stage 1 through stage 4 chronic kidney disease, or unspecified chronic kidney disease: Secondary | ICD-10-CM | POA: Diagnosis not present

## 2015-11-21 DIAGNOSIS — F0390 Unspecified dementia without behavioral disturbance: Secondary | ICD-10-CM | POA: Insufficient documentation

## 2015-11-21 DIAGNOSIS — R4182 Altered mental status, unspecified: Secondary | ICD-10-CM | POA: Diagnosis present

## 2015-11-21 LAB — CBC WITH DIFFERENTIAL/PLATELET
BASOS ABS: 0 10*3/uL (ref 0.0–0.1)
Basophils Relative: 1 %
EOS PCT: 2 %
Eosinophils Absolute: 0.1 10*3/uL (ref 0.0–0.7)
HCT: 38 % (ref 36.0–46.0)
Hemoglobin: 12.2 g/dL (ref 12.0–15.0)
LYMPHS ABS: 1.3 10*3/uL (ref 0.7–4.0)
LYMPHS PCT: 16 %
MCH: 28.8 pg (ref 26.0–34.0)
MCHC: 32.1 g/dL (ref 30.0–36.0)
MCV: 89.8 fL (ref 78.0–100.0)
MONO ABS: 0.8 10*3/uL (ref 0.1–1.0)
Monocytes Relative: 10 %
Neutro Abs: 5.7 10*3/uL (ref 1.7–7.7)
Neutrophils Relative %: 71 %
PLATELETS: 310 10*3/uL (ref 150–400)
RBC: 4.23 MIL/uL (ref 3.87–5.11)
RDW: 15.7 % — AB (ref 11.5–15.5)
WBC: 7.9 10*3/uL (ref 4.0–10.5)

## 2015-11-21 LAB — I-STAT BETA HCG BLOOD, ED (MC, WL, AP ONLY)

## 2015-11-21 LAB — COMPREHENSIVE METABOLIC PANEL
ALT: 17 U/L (ref 14–54)
AST: 29 U/L (ref 15–41)
Albumin: 4.1 g/dL (ref 3.5–5.0)
Alkaline Phosphatase: 59 U/L (ref 38–126)
Anion gap: 13 (ref 5–15)
BILIRUBIN TOTAL: 0.9 mg/dL (ref 0.3–1.2)
BUN: 26 mg/dL — AB (ref 6–20)
CO2: 22 mmol/L (ref 22–32)
Calcium: 10 mg/dL (ref 8.9–10.3)
Chloride: 103 mmol/L (ref 101–111)
Creatinine, Ser: 1.54 mg/dL — ABNORMAL HIGH (ref 0.44–1.00)
GFR, EST AFRICAN AMERICAN: 35 mL/min — AB (ref >60)
GFR, EST NON AFRICAN AMERICAN: 30 mL/min — AB (ref >60)
Glucose, Bld: 120 mg/dL — ABNORMAL HIGH (ref 65–99)
POTASSIUM: 4.3 mmol/L (ref 3.5–5.1)
Sodium: 138 mmol/L (ref 135–145)
TOTAL PROTEIN: 6.7 g/dL (ref 6.5–8.1)

## 2015-11-21 LAB — I-STAT CG4 LACTIC ACID, ED
LACTIC ACID, VENOUS: 1.28 mmol/L (ref 0.5–1.9)
Lactic Acid, Venous: 2.03 mmol/L (ref 0.5–1.9)

## 2015-11-21 MED ORDER — SODIUM CHLORIDE 0.9 % IV BOLUS (SEPSIS)
500.0000 mL | Freq: Once | INTRAVENOUS | Status: AC
  Administered 2015-11-21: 500 mL via INTRAVENOUS

## 2015-11-21 NOTE — ED Provider Notes (Signed)
[facility]Y DEPT Provider Note   CSN: [id number] Arrival date & time: 11/21/15  1843  First Provider Contact:  None       History   Chief Complaint Chief Complaint  Patient presents with  . Altered Mental Status    HPI Tracey [name] is a 82 y.o. female presents with AMS. History is provided by daughter. PMH significant for Alzheimer's disease, CHF, Paroxysmal Afib currently on anticoagulation and has loop recorder, CVA, urinary incontinence, hx of UTI, anemia. She was recently admitted in April for NSTEMI. She currently resides at [facility]. Daughter reports last known normal was 3 days ago. Over the past several days she has been getting increasingly confused. Daughter states she normally know where she is however she kept telling her daughter she did not know where she was or what she was doing. Patient denies any complaints to me. Daughter also reports decreased PO in take which appears to be chronic on review of EMR. She also wants her L shoulder evaluated. She states this is a chronic problem as well. She saw a ortho md who drew off fluid and injected "something" in to the shoulder which helped however the patient still complains of pain with ROM.  HPI  Past Medical History:  Diagnosis Date  . Abnormality of gait 10/17/2012  . Alzheimer disease   . Anemia   . Arthritis    "knees" (12/12/2014)  . Dyslipidemia   . Hemiparesis and alteration of sensations as late effects of stroke (HCC) 06/24/2014  . Hypertension   . Lumbosacral spondylosis   . Memory deficit 10/17/2012  . Spinal stenosis, lumbar   . Stroke (HCC) 2000's   mild left hemiparesis  . Urinary incontinence     Patient Active Problem List   Diagnosis Date Noted  . Nausea 08/14/2015  . Acute combined systolic and diastolic heart failure (HCC) 08/14/2015  . PAF (paroxysmal atrial fibrillation) (HCC) 08/14/2015  . UTI (urinary tract infection) 08/14/2015  . Congestive dilated cardiomyopathy (HCC) 08/14/2015  .  NSTEMI (non-ST elevated myocardial infarction) (HCC) 08/13/2015  . Paroxysmal atrial fibrillation (HCC) 02/11/2015  . Cryptogenic stroke (HCC) 01/28/2015  . Bradycardia, sinus   . Dehydration 01/03/2015  . Acute kidney injury (HCC) 01/03/2015  . Elevated transaminase level 01/03/2015  . Elevated troponin 01/03/2015  . Acid reflux 01/03/2015  . CKD (chronic kidney disease), stage III 01/03/2015  . Prolonged QT interval 01/03/2015  . History of CVA (cerebrovascular accident) 12/12/2014  . FTT (failure to thrive) in adult 12/11/2014  . Altered mental status 06/24/2014  . Hemiparesis and alteration of sensations as late effects of stroke (HCC) 06/24/2014  . 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 Dementia with Lewy bodies   . HLD (hyperlipidemia)   . Memory deficit 10/17/2012  . Abnormality of gait 10/17/2012    Past Surgical History:  Procedure Laterality Date  . CATARACT EXTRACTION W/ INTRAOCULAR LENS  IMPLANT, BILATERAL Bilateral   . EP IMPLANTABLE DEVICE N/A 09/08/2014   Procedure: Loop Recorder Insertion;  Surgeon: Marinus Maw, MD;  Location: [facility];  Service: Cardiovascular;  Laterality: N/A;  . TUBAL LIGATION Bilateral     OB History    No data available       Home Medications    Prior to Admission medications   Medication Sig Start Date End Date Taking? Authorizing Provider  acetaminophen (TYLENOL) 500 MG tablet Take 500 mg by mouth 2 (two) times daily. May also give q6hprn  Historical Provider, MD  acetaminophen-codeine (TYLENOL #2) 300-15 MG tablet Take 1 tablet by mouth daily.    Historical Provider, MD  clobetasol (TEMOVATE) 0.05 % external solution Apply 1 application topically daily. Use mon, tue, wed, thur, fri for itching    Historical Provider, MD  feeding supplement (BOOST HIGH PROTEIN) LIQD Take 1 Container by mouth 2 (two) times daily between meals.    Historical Provider, MD  FLUoxetine (PROZAC) 10 MG tablet Take 10  mg by mouth daily.    Historical Provider, MD  hydrocortisone cream 1 % Apply 1 application topically See admin instructions. Use mon, tue, wed, thu, fri    Historical Provider, MD  lisinopril (PRINIVIL,ZESTRIL) 2.5 MG tablet Take 1 tablet (2.5 mg total) by mouth daily. 08/17/15   Clydia Llano, MD  loratadine (CLARITIN) 10 MG tablet Take 10 mg by mouth daily.    Historical Provider, MD  Melatonin 3 MG CAPS Take 3 mg by mouth at bedtime.    Historical Provider, MD  Omega-3 Fatty Acids (FISH OIL) 1000 MG CAPS Take 1,000 mg by mouth daily.    Historical Provider, MD  omeprazole (PRILOSEC) 20 MG capsule Take 20 mg by mouth daily.    Historical Provider, MD  PENNSAID 2 % SOLN Place 2 application onto the skin 2 (two) times daily. 08/11/15   Historical Provider, MD  polyethylene glycol (MIRALAX / GLYCOLAX) packet Take 17 g by mouth every other day. 12/13/14   Shanker Levora Dredge, MD  promethazine (PHENERGAN) 25 MG tablet Take 25 mg by mouth every 8 (eight) hours as needed for nausea or vomiting.    Historical Provider, MD  Rivaroxaban (XARELTO) 15 MG TABS tablet Take 1 tablet (15 mg total) by mouth daily. 07/21/15   Marinus Maw, MD    Family History Family History  Problem Relation Age of Onset  . Parkinsonism Brother   . Dementia Brother   . Heart disease Brother     Social History Social History  Substance Use Topics  . Smoking status: Never Smoker  . Smokeless tobacco: Never Used  . Alcohol use 4.2 oz/week    7 Glasses of wine per week     Comment: 1 glass of wine per day     Allergies   Alendronate sodium; Namenda [memantine hcl]; and Simvastatin   Review of Systems Review of Systems  Unable to perform ROS: Dementia     Physical Exam Updated Vital Signs BP 109/74   Pulse 63   Temp 98.5 F (36.9 C)   Resp 22   SpO2 99%   Physical Exam  Constitutional: She is oriented to person, place, and time. She appears well-developed and well-nourished. No distress.  Cachetic  HENT:    Head: Normocephalic and atraumatic.  Eyes: Conjunctivae and EOM are normal. Pupils are equal, round, and reactive to light. Right eye exhibits no discharge. Left eye exhibits no discharge. No scleral icterus.  Abnormality to right eye due to prior surgery  Neck: Normal range of motion. Neck supple.  No midline C-spine tenderness  Cardiovascular: Normal rate and regular rhythm.  Exam reveals no gallop and no friction rub.   No murmur heard. Pulmonary/Chest: Effort normal and breath sounds normal. No respiratory distress. She has no wheezes. She has no rales. She exhibits no tenderness.  Abdominal: Soft. Bowel sounds are normal. She exhibits no distension and no mass. There is no tenderness. There is no rebound and no guarding. No hernia.  Musculoskeletal: She exhibits no edema.  Kyphosis  of spine  Neurological: She is alert and oriented to person, place, and time.  Skin: Skin is warm and dry.  Psychiatric: She has a normal mood and affect. Her behavior is normal.  Nursing note and vitals reviewed.    ED Treatments / Results  Labs (all labs ordered are listed, but only abnormal results are displayed) Labs Reviewed  COMPREHENSIVE METABOLIC PANEL - Abnormal; Notable for the following:       Result Value   Glucose, Bld 120 (*)    BUN 26 (*)    Creatinine, Ser 1.54 (*)    GFR calc non Af Amer 30 (*)    GFR calc Af Amer 35 (*)    All other components within normal limits  CBC WITH DIFFERENTIAL/PLATELET - Abnormal; Notable for the following:    RDW 15.7 (*)    All other components within normal limits  URINALYSIS, ROUTINE W REFLEX MICROSCOPIC (NOT AT [facility]) - Abnormal; Notable for the following:    Color, Urine AMBER (*)    Hgb urine dipstick LARGE (*)    Bilirubin Urine SMALL (*)    Ketones, ur 15 (*)    Protein, ur 30 (*)    All other components within normal limits  URINE MICROSCOPIC-ADD ON - Abnormal; Notable for the following:    Squamous Epithelial / LPF 0-5 (*)    Bacteria,  UA RARE (*)    All other components within normal limits  I-STAT CG4 LACTIC ACID, ED - Abnormal; Notable for the following:    Lactic Acid, Venous 2.03 (*)    All other components within normal limits  CULTURE, BLOOD (ROUTINE X 2)  CULTURE, BLOOD (ROUTINE X 2)  URINE CULTURE  I-STAT BETA HCG BLOOD, ED ([facility], [facility], [facility] ONLY)  I-STAT CG4 LACTIC ACID, ED    EKG  EKG Interpretation  Date/Time:  Saturday November 21 2015 19:43:47 EDT Ventricular Rate:  106 PR Interval:    QRS Duration: 74 QT Interval:  346 QTC Calculation: 459 R Axis:   -40 Text Interpretation:  Atrial flutter with variable A-V block Left axis deviation Anteroseptal infarct , age undetermined ST & T wave abnormality, consider inferolateral ischemia Abnormal ECG When compared with ECG of 08/13/2015, Atrial flutter has replaced Sinus rhythm Confirmed by Preston Fleeting  MD, DAVID ([id number]) on 11/21/2015 7:53:15 PM       Radiology Dg Chest 2 View  Result Date: 11/21/2015 CLINICAL DATA:  82 year old female with altered mental status and possible sepsis EXAM: CHEST  2 VIEW COMPARISON:  Chest radiograph dated 08/13/2015 FINDINGS: Two views of the chest demonstrate emphysematous changes of the lungs. There is no focal consolidation, pleural effusion, or pneumothorax. Top-normal cardiac silhouette. Cardiac monitor device is noted. There is osteopenia with multilevel compression fracture of the thoracic spine, age indeterminate, likely old and similar to prior study. No definite acute fracture. IMPRESSION: No active cardiopulmonary disease. Electronically Signed   By: Elgie Collard M.D.   On: 11/21/2015 21:15    Procedures Procedures (including critical care time)  Medications Ordered in ED Medications  sodium chloride 0.9 % bolus 500 mL (0 mLs Intravenous Stopped 11/22/15 0059)     Initial Impression / Assessment and Plan / ED Course  I have reviewed the triage vital signs and the nursing notes.  Pertinent labs & imaging results that were  available during my care of the patient were reviewed by me and considered in my medical decision making (see chart for details).  Clinical Course   82 year  old female who presents with AMS. Unclear etiology although she is very thin and appears dehydrated. She is in A. Flutter on arrival however this is normal for her. CBC is unremarkable. CMP remarkable for elevated BUN/SCr from previous (15/0.91 in May and 26/1.54 today) - most likely due to decreased PO intake/failure to thrive. Lactic acid initially elevated however repeat is normal. IVF bolus given. UA is not obviously infected however shows ketones and protein with large hgb. CT head and neck are normal. CXR is clear. L shoulder shows arthritis but otherwise normal. Do not feel that there is any emergent reason to keep patient. Shared visit with Dr. Judd Lien. Will d/c patient back to [facility] with close follow up.  Final Clinical Impressions(s) / ED Diagnoses   Final diagnoses:  Dementia, without behavioral disturbance  Dehydration    New Prescriptions New Prescriptions   No medications on file     Bethel Born, PA-C 11/22/15 1515    Geoffery Lyons, MD 11/23/15 [phone]

## 2015-11-21 NOTE — ED Notes (Signed)
Dr. Delo at bedside at this time.  

## 2015-11-21 NOTE — ED Notes (Signed)
Patient transported to X-ray 

## 2015-11-21 NOTE — ED Triage Notes (Signed)
Patient resides at [facility].  She has hx of alzheimers  Patient was visited by her daughter at 83 and states when she woke from a nap she seemed more confused.  Patient daughter states yesterday her mom also seemed a little confused.  Patient today was asking why she is here, where she was, and more questions that she does not normally ask.  Patient asked about her wheelchair.  Patient has hx of uti.  She is noted to be cool to touch and bp is low.  Daughter is also wanting someone to look at her left shoulder.  She has hx of shoulder problems but seems to be worse again.

## 2015-11-21 NOTE — ED Notes (Signed)
Patient has hx of slow heart rate.  She has not been able to get injections for pain due to slow heart rate.  Daughter is also reporting rib pain

## 2015-11-22 DIAGNOSIS — F0390 Unspecified dementia without behavioral disturbance: Secondary | ICD-10-CM | POA: Diagnosis not present

## 2015-11-22 LAB — URINALYSIS, ROUTINE W REFLEX MICROSCOPIC
GLUCOSE, UA: NEGATIVE mg/dL
KETONES UR: 15 mg/dL — AB
Leukocytes, UA: NEGATIVE
Nitrite: NEGATIVE
PROTEIN: 30 mg/dL — AB
Specific Gravity, Urine: 1.024 (ref 1.005–1.030)
pH: 5.5 (ref 5.0–8.0)

## 2015-11-22 LAB — URINE MICROSCOPIC-ADD ON: WBC, UA: NONE SEEN WBC/hpf (ref 0–5)

## 2015-11-22 NOTE — ED Notes (Signed)
Tammy, RN from [facility] notified about [facility] unable to get pt's WC with them, Tammy states they will send someone to pick up WC in the morning.

## 2015-11-23 LAB — URINE CULTURE: CULTURE: NO GROWTH

## 2015-11-26 LAB — CULTURE, BLOOD (ROUTINE X 2)
CULTURE: NO GROWTH
Culture: NO GROWTH

## 2015-12-02 ENCOUNTER — Ambulatory Visit (INDEPENDENT_AMBULATORY_CARE_PROVIDER_SITE_OTHER): Payer: Medicare Other | Admitting: *Deleted

## 2015-12-02 DIAGNOSIS — I639 Cerebral infarction, unspecified: Secondary | ICD-10-CM

## 2015-12-03 ENCOUNTER — Emergency Department (HOSPITAL_COMMUNITY): Payer: Medicare Other

## 2015-12-03 ENCOUNTER — Encounter (HOSPITAL_COMMUNITY): Payer: Self-pay

## 2015-12-03 ENCOUNTER — Inpatient Hospital Stay (HOSPITAL_COMMUNITY)
Admission: EM | Admit: 2015-12-03 | Discharge: 2015-12-05 | DRG: 391 | Payer: Medicare Other | Attending: Family Medicine | Admitting: Family Medicine

## 2015-12-03 DIAGNOSIS — Z9842 Cataract extraction status, left eye: Secondary | ICD-10-CM

## 2015-12-03 DIAGNOSIS — E43 Unspecified severe protein-calorie malnutrition: Secondary | ICD-10-CM | POA: Diagnosis present

## 2015-12-03 DIAGNOSIS — Z9841 Cataract extraction status, right eye: Secondary | ICD-10-CM

## 2015-12-03 DIAGNOSIS — A09 Infectious gastroenteritis and colitis, unspecified: Principal | ICD-10-CM | POA: Diagnosis present

## 2015-12-03 DIAGNOSIS — I10 Essential (primary) hypertension: Secondary | ICD-10-CM | POA: Diagnosis present

## 2015-12-03 DIAGNOSIS — Z6823 Body mass index (BMI) 23.0-23.9, adult: Secondary | ICD-10-CM

## 2015-12-03 DIAGNOSIS — G309 Alzheimer's disease, unspecified: Secondary | ICD-10-CM | POA: Diagnosis present

## 2015-12-03 DIAGNOSIS — K529 Noninfective gastroenteritis and colitis, unspecified: Secondary | ICD-10-CM

## 2015-12-03 DIAGNOSIS — Z8673 Personal history of transient ischemic attack (TIA), and cerebral infarction without residual deficits: Secondary | ICD-10-CM

## 2015-12-03 DIAGNOSIS — R531 Weakness: Secondary | ICD-10-CM | POA: Diagnosis not present

## 2015-12-03 DIAGNOSIS — Z961 Presence of intraocular lens: Secondary | ICD-10-CM | POA: Diagnosis present

## 2015-12-03 DIAGNOSIS — Z66 Do not resuscitate: Secondary | ICD-10-CM | POA: Diagnosis present

## 2015-12-03 DIAGNOSIS — I4581 Long QT syndrome: Secondary | ICD-10-CM | POA: Diagnosis present

## 2015-12-03 DIAGNOSIS — F0280 Dementia in other diseases classified elsewhere without behavioral disturbance: Secondary | ICD-10-CM | POA: Diagnosis present

## 2015-12-03 DIAGNOSIS — I4892 Unspecified atrial flutter: Secondary | ICD-10-CM | POA: Diagnosis present

## 2015-12-03 DIAGNOSIS — G8194 Hemiplegia, unspecified affecting left nondominant side: Secondary | ICD-10-CM | POA: Diagnosis present

## 2015-12-03 DIAGNOSIS — E785 Hyperlipidemia, unspecified: Secondary | ICD-10-CM | POA: Diagnosis present

## 2015-12-03 LAB — CBC
HCT: 36.6 % (ref 36.0–46.0)
Hemoglobin: 11.7 g/dL — ABNORMAL LOW (ref 12.0–15.0)
MCH: 29.9 pg (ref 26.0–34.0)
MCHC: 32 g/dL (ref 30.0–36.0)
MCV: 93.6 fL (ref 78.0–100.0)
PLATELETS: 207 10*3/uL (ref 150–400)
RBC: 3.91 MIL/uL (ref 3.87–5.11)
RDW: 15.6 % — AB (ref 11.5–15.5)
WBC: 6.4 10*3/uL (ref 4.0–10.5)

## 2015-12-03 LAB — COMPREHENSIVE METABOLIC PANEL
ALT: 13 U/L — ABNORMAL LOW (ref 14–54)
ANION GAP: 17 — AB (ref 5–15)
AST: 25 U/L (ref 15–41)
Albumin: 3.8 g/dL (ref 3.5–5.0)
Alkaline Phosphatase: 49 U/L (ref 38–126)
BILIRUBIN TOTAL: 1.1 mg/dL (ref 0.3–1.2)
BUN: 19 mg/dL (ref 6–20)
CO2: 17 mmol/L — ABNORMAL LOW (ref 22–32)
Calcium: 9.6 mg/dL (ref 8.9–10.3)
Chloride: 103 mmol/L (ref 101–111)
Creatinine, Ser: 1.32 mg/dL — ABNORMAL HIGH (ref 0.44–1.00)
GFR, EST AFRICAN AMERICAN: 42 mL/min — AB (ref >60)
GFR, EST NON AFRICAN AMERICAN: 36 mL/min — AB (ref >60)
Glucose, Bld: 85 mg/dL (ref 65–99)
POTASSIUM: 4.7 mmol/L (ref 3.5–5.1)
Sodium: 137 mmol/L (ref 135–145)
TOTAL PROTEIN: 5.8 g/dL — AB (ref 6.5–8.1)

## 2015-12-03 LAB — URINALYSIS, ROUTINE W REFLEX MICROSCOPIC
Glucose, UA: NEGATIVE mg/dL
KETONES UR: 40 mg/dL — AB
Leukocytes, UA: NEGATIVE
NITRITE: POSITIVE — AB
PH: 5.5 (ref 5.0–8.0)
Protein, ur: 30 mg/dL — AB
Specific Gravity, Urine: 1.026 (ref 1.005–1.030)

## 2015-12-03 LAB — BASIC METABOLIC PANEL
Anion gap: 15 (ref 5–15)
BUN: 18 mg/dL (ref 6–20)
CHLORIDE: 103 mmol/L (ref 101–111)
CO2: 18 mmol/L — ABNORMAL LOW (ref 22–32)
CREATININE: 1.26 mg/dL — AB (ref 0.44–1.00)
Calcium: 9.6 mg/dL (ref 8.9–10.3)
GFR calc Af Amer: 44 mL/min — ABNORMAL LOW (ref >60)
GFR, EST NON AFRICAN AMERICAN: 38 mL/min — AB (ref >60)
Glucose, Bld: 86 mg/dL (ref 65–99)
Potassium: 4.7 mmol/L (ref 3.5–5.1)
SODIUM: 136 mmol/L (ref 135–145)

## 2015-12-03 LAB — LIPASE, BLOOD: LIPASE: 35 U/L (ref 11–51)

## 2015-12-03 LAB — CUP PACEART REMOTE DEVICE CHECK: Date Time Interrogation Session: 20170717231046

## 2015-12-03 LAB — I-STAT TROPONIN, ED: TROPONIN I, POC: 0.01 ng/mL (ref 0.00–0.08)

## 2015-12-03 LAB — URINE MICROSCOPIC-ADD ON

## 2015-12-03 LAB — OCCULT BLOOD X 1 CARD TO LAB, STOOL: Fecal Occult Bld: NEGATIVE

## 2015-12-03 LAB — CBG MONITORING, ED: Glucose-Capillary: 76 mg/dL (ref 65–99)

## 2015-12-03 LAB — TROPONIN I

## 2015-12-03 MED ORDER — SODIUM CHLORIDE 0.9% FLUSH
3.0000 mL | Freq: Two times a day (BID) | INTRAVENOUS | Status: DC
  Administered 2015-12-03 – 2015-12-05 (×2): 3 mL via INTRAVENOUS

## 2015-12-03 MED ORDER — DULOXETINE HCL 30 MG PO CPEP
30.0000 mg | ORAL_CAPSULE | Freq: Two times a day (BID) | ORAL | Status: DC
  Administered 2015-12-03 – 2015-12-05 (×4): 30 mg via ORAL
  Filled 2015-12-03 (×5): qty 1

## 2015-12-03 MED ORDER — SODIUM CHLORIDE 0.9 % IV SOLN
500.0000 mg | INTRAVENOUS | Status: DC
  Filled 2015-12-03: qty 0.5

## 2015-12-03 MED ORDER — DICLOFENAC SODIUM 2 % TD SOLN: 2.0000 "application " | Freq: Two times a day (BID) | TRANSDERMAL | Status: DC

## 2015-12-03 MED ORDER — PANTOPRAZOLE SODIUM 40 MG PO TBEC
40.0000 mg | DELAYED_RELEASE_TABLET | Freq: Every day | ORAL | Status: DC
Start: 2015-12-04 — End: 2015-12-05
  Administered 2015-12-04 – 2015-12-05 (×2): 40 mg via ORAL
  Filled 2015-12-03 (×2): qty 1

## 2015-12-03 MED ORDER — ONDANSETRON HCL 4 MG/2ML IJ SOLN: 4.0000 mg | Freq: Four times a day (QID) | INTRAMUSCULAR | Status: DC | PRN

## 2015-12-03 MED ORDER — ACETAMINOPHEN-CODEINE #3 300-30 MG PO TABS
1.0000 | ORAL_TABLET | Freq: Every day | ORAL | Status: DC
  Administered 2015-12-03 – 2015-12-05 (×2): 1 via ORAL
  Filled 2015-12-03 (×2): qty 1

## 2015-12-03 MED ORDER — SENNA 8.6 MG PO TABS
1.0000 | ORAL_TABLET | Freq: Two times a day (BID) | ORAL | Status: DC
Start: 2015-12-03 — End: 2015-12-05
  Administered 2015-12-03 – 2015-12-05 (×3): 8.6 mg via ORAL
  Filled 2015-12-03 (×3): qty 1

## 2015-12-03 MED ORDER — POLYETHYLENE GLYCOL 3350 17 G PO PACK: 17.0000 g | PACK | Freq: Every day | ORAL | Status: DC | PRN

## 2015-12-03 MED ORDER — LAMOTRIGINE 25 MG PO TABS
25.0000 mg | ORAL_TABLET | Freq: Two times a day (BID) | ORAL | Status: DC
  Administered 2015-12-03 – 2015-12-05 (×4): 25 mg via ORAL
  Filled 2015-12-03 (×4): qty 1

## 2015-12-03 MED ORDER — PROMETHAZINE HCL 25 MG PO TABS
25.0000 mg | ORAL_TABLET | Freq: Three times a day (TID) | ORAL | Status: DC | PRN
Start: 2015-12-03 — End: 2015-12-05

## 2015-12-03 MED ORDER — ALBUTEROL SULFATE (2.5 MG/3ML) 0.083% IN NEBU: 2.5000 mg | INHALATION_SOLUTION | RESPIRATORY_TRACT | Status: DC | PRN

## 2015-12-03 MED ORDER — SULFAMETHOXAZOLE-TRIMETHOPRIM 800-160 MG PO TABS
1.0000 | ORAL_TABLET | Freq: Two times a day (BID) | ORAL | Status: DC
  Administered 2015-12-03 – 2015-12-05 (×4): 1 via ORAL
  Filled 2015-12-03 (×4): qty 1

## 2015-12-03 MED ORDER — CLOBETASOL PROPIONATE 0.05 % EX OINT
TOPICAL_OINTMENT | CUTANEOUS | Status: DC
  Filled 2015-12-03: qty 15

## 2015-12-03 MED ORDER — METRONIDAZOLE IN NACL 5-0.79 MG/ML-% IV SOLN
500.0000 mg | Freq: Once | INTRAVENOUS | Status: AC
  Administered 2015-12-03: 500 mg via INTRAVENOUS
  Filled 2015-12-03: qty 100

## 2015-12-03 MED ORDER — SODIUM CHLORIDE 0.9% FLUSH: 3.0000 mL | INTRAVENOUS | Status: DC | PRN

## 2015-12-03 MED ORDER — TRAZODONE HCL 50 MG PO TABS: 50.0000 mg | ORAL_TABLET | Freq: Every evening | ORAL | Status: DC | PRN

## 2015-12-03 MED ORDER — SODIUM CHLORIDE 0.9 % IV BOLUS (SEPSIS)
500.0000 mL | Freq: Once | INTRAVENOUS | Status: AC
  Administered 2015-12-03: 500 mL via INTRAVENOUS

## 2015-12-03 MED ORDER — ONDANSETRON HCL 4 MG PO TABS: 4.0000 mg | ORAL_TABLET | Freq: Four times a day (QID) | ORAL | Status: DC | PRN

## 2015-12-03 MED ORDER — METRONIDAZOLE IN NACL 5-0.79 MG/ML-% IV SOLN
500.0000 mg | Freq: Three times a day (TID) | INTRAVENOUS | Status: DC
  Administered 2015-12-03 – 2015-12-05 (×5): 500 mg via INTRAVENOUS
  Filled 2015-12-03 (×8): qty 100

## 2015-12-03 MED ORDER — BOOST HIGH PROTEIN PO LIQD
1.0000 | Freq: Two times a day (BID) | ORAL | Status: DC
  Administered 2015-12-04 – 2015-12-05 (×3): 237 mL via ORAL
  Filled 2015-12-03 (×6): qty 237

## 2015-12-03 MED ORDER — MELATONIN 3 MG PO TABS
3.0000 mg | ORAL_TABLET | Freq: Every day | ORAL | Status: DC
  Administered 2015-12-03 – 2015-12-04 (×2): 3 mg via ORAL
  Filled 2015-12-03 (×3): qty 1

## 2015-12-03 MED ORDER — LIDOCAINE 5 % EX PTCH
1.0000 | MEDICATED_PATCH | CUTANEOUS | Status: DC
  Administered 2015-12-03 – 2015-12-04 (×2): 1 via TRANSDERMAL
  Filled 2015-12-03 (×2): qty 1

## 2015-12-03 MED ORDER — SODIUM CHLORIDE 0.9 % IV SOLN: 250.0000 mL | INTRAVENOUS | Status: DC | PRN

## 2015-12-03 MED ORDER — ACETAMINOPHEN 650 MG RE SUPP: 650.0000 mg | Freq: Four times a day (QID) | RECTAL | Status: DC | PRN

## 2015-12-03 MED ORDER — CLOBETASOL PROPIONATE 0.05 % EX SOLN: 1.0000 "application " | Freq: Every day | CUTANEOUS | Status: DC

## 2015-12-03 MED ORDER — DEXTROSE 5 % IV SOLN
1.0000 g | Freq: Once | INTRAVENOUS | Status: AC
  Administered 2015-12-03: 1 g via INTRAVENOUS
  Filled 2015-12-03: qty 10

## 2015-12-03 MED ORDER — LORATADINE 10 MG PO TABS
10.0000 mg | ORAL_TABLET | Freq: Every day | ORAL | Status: DC
  Administered 2015-12-03 – 2015-12-05 (×3): 10 mg via ORAL
  Filled 2015-12-03 (×3): qty 1

## 2015-12-03 MED ORDER — OMEGA-3-ACID ETHYL ESTERS 1 G PO CAPS
1.0000 g | ORAL_CAPSULE | Freq: Every day | ORAL | Status: DC
  Administered 2015-12-03 – 2015-12-05 (×3): 1 g via ORAL
  Filled 2015-12-03 (×3): qty 1

## 2015-12-03 MED ORDER — HYDROCORTISONE 1 % EX CREA
1.0000 "application " | TOPICAL_CREAM | CUTANEOUS | Status: DC
  Filled 2015-12-03: qty 28

## 2015-12-03 MED ORDER — RIVAROXABAN 15 MG PO TABS
15.0000 mg | ORAL_TABLET | Freq: Every day | ORAL | Status: DC
  Administered 2015-12-03 – 2015-12-04 (×2): 15 mg via ORAL
  Filled 2015-12-03 (×2): qty 1

## 2015-12-03 MED ORDER — IOPAMIDOL (ISOVUE-300) INJECTION 61%
INTRAVENOUS | Status: AC
  Administered 2015-12-03: 80 mL
  Filled 2015-12-03: qty 100

## 2015-12-03 MED ORDER — DICLOFENAC SODIUM 1 % TD GEL
2.0000 g | Freq: Two times a day (BID) | TRANSDERMAL | Status: DC
  Administered 2015-12-03 – 2015-12-05 (×4): 2 g via TOPICAL
  Filled 2015-12-03: qty 100

## 2015-12-03 MED ORDER — ACETAMINOPHEN 500 MG PO TABS
500.0000 mg | ORAL_TABLET | Freq: Two times a day (BID) | ORAL | Status: DC
  Administered 2015-12-04 – 2015-12-05 (×3): 500 mg via ORAL
  Filled 2015-12-03 (×3): qty 1

## 2015-12-03 MED ORDER — POLYETHYLENE GLYCOL 3350 17 G PO PACK: 17.0000 g | PACK | ORAL | Status: DC

## 2015-12-03 MED ORDER — ACETAMINOPHEN-CODEINE #2 300-15 MG PO TABS: 1.0000 | ORAL_TABLET | Freq: Every day | ORAL | Status: DC

## 2015-12-03 MED ORDER — ACETAMINOPHEN 325 MG PO TABS
650.0000 mg | ORAL_TABLET | Freq: Four times a day (QID) | ORAL | Status: DC | PRN
  Administered 2015-12-05: 650 mg via ORAL
  Filled 2015-12-03: qty 2

## 2015-12-03 NOTE — ED Notes (Signed)
Pt returned from xray

## 2015-12-03 NOTE — Progress Notes (Signed)
Carelink Summary Report / Loop Recorder 

## 2015-12-03 NOTE — ED Triage Notes (Signed)
[facility]. [facility] and paramedics were called for pt. Having ALOC.. When paramedics arrived pt. Is alert and oriented X4.  She denies any pain, reports feeling week for a while.   She is in a-fib.  Skin is warm and dry,  Mucous membranes are dry, Her skin is dry and tenting

## 2015-12-03 NOTE — ED Notes (Signed)
CBG 76 

## 2015-12-03 NOTE — H&P (Signed)
Patient Demographics:    Tracey [name], is a 82 y.o. female  MRN: [id number]   DOB - 01-Sep-1930  Admit Date - 12/03/2015  Outpatient Primary MD for the patient is Darrow Bussing, MD   Assessment & Plan:    Principal Problem:   Colitis, infectious Active Problems:   Colitis    1)Acute Colitis- Discussed with Dr. Nickie Retort, Unable to use quinolones due to concerns about QT prolongation, treat empirically with oral Bactrim and IV Flagyl stool culture pending stool for C. difficile pending  2)H/o CVA/Paroxysmal Atrial fibrillation-continue Xarelto    With History of - Reviewed by me  Past Medical History:  Diagnosis Date  . Abnormality of gait 10/17/2012  . Alzheimer disease   . Anemia   . Arthritis    "knees" (12/12/2014)  . Dyslipidemia   . Hemiparesis and alteration of sensations as late effects of stroke (HCC) 06/24/2014  . Hypertension   . Lumbosacral spondylosis   . Memory deficit 10/17/2012  . Spinal stenosis, lumbar   . Stroke (HCC) 2000's   mild left hemiparesis  . Urinary incontinence       Past Surgical History:  Procedure Laterality Date  . CATARACT EXTRACTION W/ INTRAOCULAR LENS  IMPLANT, BILATERAL Bilateral   . EP IMPLANTABLE DEVICE N/A 09/08/2014   Procedure: Loop Recorder Insertion;  Surgeon: Marinus Maw, MD;  Location: [facility];  Service: Cardiovascular;  Laterality: N/A;  . TUBAL LIGATION Bilateral       Chief Complaint  Patient presents with  . Weakness      HPI:    Tracey [name]  is a 82 y.o. female, with h/o memory deficits, Afib and prior CVA presents from ALF with c/o Nausea, abd pain and emesis.  +chills , no documented fevers, Pt is a poor historian. States emesis was w/o bile or blood, can't recall last BM. No cp or sob. Abd pain is worse today, mostly in lower  abd, no CVA tenderness and no dysuria  In ED BP was low, improved with IVF, no fever/Leukocytosis,     Review of systems:    In addition to the HPI above,   A full 12 point Review of Systems was done, except as stated above, all other Review of Systems were negative.    Social History:  Reviewed by me    Social History  Substance Use Topics  . Smoking status: Never Smoker  . Smokeless tobacco: Never Used  . Alcohol use 4.2 oz/week    7 Glasses of wine per week     Comment: 1 glass of wine per day       Family History :  Reviewed by me    Family History  Problem Relation Age of Onset  . Parkinsonism Brother   . Dementia Brother   . Heart disease Brother      Home Medications:   Prior to Admission medications   Medication Sig  Start Date End Date Taking? Authorizing Provider  acetaminophen (TYLENOL) 500 MG tablet Take 500 mg by mouth 2 (two) times daily. May also give q6hprn   Yes Historical Provider, MD  acetaminophen-codeine (TYLENOL #2) 300-15 MG tablet Take 1 tablet by mouth daily.   Yes Historical Provider, MD  clobetasol (TEMOVATE) 0.05 % external solution Apply 1 application topically daily. Use mon, tue, wed, thur, fri for itching   Yes Historical Provider, MD  DULoxetine (CYMBALTA) 30 MG capsule Take 30 mg by mouth 2 (two) times daily.   Yes Historical Provider, MD  feeding supplement (BOOST HIGH PROTEIN) LIQD Take 1 Container by mouth 2 (two) times daily between meals.   Yes Historical Provider, MD  hydrocortisone cream 1 % Apply 1 application topically See admin instructions. Use mon, tue, wed, thu, fri   Yes Historical Provider, MD  lamoTRIgine (LAMICTAL) 25 MG tablet Take 25 mg by mouth 2 (two) times daily.   Yes Historical Provider, MD  lidocaine (LIDODERM) 5 % Place 1 patch onto the skin daily. Remove & Discard patch within 12 hours or as directed by MD   Yes Historical Provider, MD  loratadine (CLARITIN) 10 MG tablet Take 10 mg by mouth daily.   Yes  Historical Provider, MD  Melatonin 3 MG CAPS Take 3 mg by mouth at bedtime.   Yes Historical Provider, MD  Omega-3 Fatty Acids (FISH OIL) 1000 MG CAPS Take 1,000 mg by mouth daily.   Yes Historical Provider, MD  omeprazole (PRILOSEC) 20 MG capsule Take 20 mg by mouth daily.   Yes Historical Provider, MD  PENNSAID 2 % SOLN Place 2 application onto the skin 2 (two) times daily. 08/11/15  Yes Historical Provider, MD  polyethylene glycol (MIRALAX / GLYCOLAX) packet Take 17 g by mouth every other day. 12/13/14  Yes Shanker Levora DredgeM Ghimire, MD  promethazine (PHENERGAN) 25 MG tablet Take 25 mg by mouth every 8 (eight) hours as needed for nausea or vomiting.   Yes Historical Provider, MD  Rivaroxaban (XARELTO) 15 MG TABS tablet Take 1 tablet (15 mg total) by mouth daily. 07/21/15  Yes Marinus MawGregg W Taylor, MD     Allergies:     Allergies  Allergen Reactions  . Namenda [Memantine Hcl] Other (See Comments)    halucinations  . Alendronate Sodium Other (See Comments)    unknown  . Simvastatin Other (See Comments)    unknown     Physical Exam:   Vitals  Blood pressure 100/90, pulse 73, temperature 98.6 F (37 C), temperature source Rectal, resp. rate (!) 30, height 5' (1.524 m), weight 55.3 kg (122 lb), SpO2 100 %.  Physical Examination: General appearance - alert, cachectic appearing, and in no distress   Mental status - alert, oriented to person, disoriented with cognitive deficits Eyes - sclera anicteric Neck - supple, no JVD elevation , Chest - clear  to auscultation bilaterally, symmetrical air movement,  Heart - S1 and S2 normal,  Abdomen - soft, tender in Lower quadrants w/o rebound/gaurding, nondistended,  Neurological -  neck supple without rigidity, cranial nerves II through XII intact, DTR's normal and symmetric Extremities - no pedal edema noted, intact peripheral pulses  Skin - warm, dry    Data Review:    CBC  Recent Labs Lab 12/03/15 1050  WBC 6.4  HGB 11.7*  HCT 36.6  PLT 207    MCV 93.6  MCH 29.9  MCHC 32.0  RDW 15.6*   ------------------------------------------------------------------------------------------------------------------  Chemistries   Recent Labs Lab 12/03/15  1050  NA 137  136  K 4.7  4.7  CL 103  103  CO2 17*  18*  GLUCOSE 85  86  BUN 19  18  CREATININE 1.32*  1.26*  CALCIUM 9.6  9.6  AST 25  ALT 13*  ALKPHOS 49  BILITOT 1.1   ------------------------------------------------------------------------------------------------------------------ estimated creatinine clearance is 26.4 mL/min (by C-G formula based on SCr of 1.26 mg/dL). ------------------------------------------------------------------------------------------------------------------ No results for input(s): TSH, T4TOTAL, T3FREE, THYROIDAB in the last 72 hours.  Invalid input(s): FREET3   Coagulation profile No results for input(s): INR, PROTIME in the last 168 hours. ------------------------------------------------------------------------------------------------------------------- No results for input(s): DDIMER in the last 72 hours. -------------------------------------------------------------------------------------------------------------------  Cardiac Enzymes  Recent Labs Lab 12/03/15 1050  TROPONINI <0.03   ------------------------------------------------------------------------------------------------------------------ No results found for: BNP   ---------------------------------------------------------------------------------------------------------------  Urinalysis    Component Value Date/Time   COLORURINE YELLOW 12/03/2015 1035   APPEARANCEUR HAZY (A) 12/03/2015 1035   LABSPEC 1.026 12/03/2015 1035   PHURINE 5.5 12/03/2015 1035   GLUCOSEU NEGATIVE 12/03/2015 1035   HGBUR MODERATE (A) 12/03/2015 1035   BILIRUBINUR SMALL (A) 12/03/2015 1035   KETONESUR 40 (A) 12/03/2015 1035   PROTEINUR 30 (A) 12/03/2015 1035   UROBILINOGEN 0.2  01/03/2015 1316   NITRITE POSITIVE (A) 12/03/2015 1035   LEUKOCYTESUR NEGATIVE 12/03/2015 1035    ----------------------------------------------------------------------------------------------------------------   Imaging Results:    Dg Chest 2 View  Result Date: 12/03/2015 CLINICAL DATA:  Low O2 sats, very weak today. EXAM: CHEST  2 VIEW COMPARISON:  Chest x-rays dated 11/21/2015 and 08/13/2015. FINDINGS: Heart size is upper normal, stable. Mediastinal contours remain normal. Overlying loop recorder again noted Lungs are hyperexpanded. No evidence of pneumonia. No pleural effusion or pneumothorax seen. Mild degenerative change noted within the slightly kyphotic and scoliotic thoracic spine. No acute or suspicious osseous finding. IMPRESSION: No active cardiopulmonary disease. Hyperexpanded lungs suggesting COPD. Electronically Signed   By: Bary Richard M.D.   On: 12/03/2015 13:12   Ct Abdomen Pelvis W Contrast  Result Date: 12/03/2015 CLINICAL DATA:  Nausea, vomiting and diarrhea for several days. EXAM: CT ABDOMEN AND PELVIS WITH CONTRAST TECHNIQUE: Multidetector CT imaging of the abdomen and pelvis was performed using the standard protocol following bolus administration of intravenous contrast. CONTRAST:  80mL ISOVUE-300 IOPAMIDOL (ISOVUE-300) INJECTION 61% COMPARISON:  None. FINDINGS: Lower chest:  Mild atelectasis/ scarring at each lung base. Hepatobiliary: Liver appears normal. Gallbladder is mildly distended but otherwise unremarkable. No inflammation to suggest acute cholecystitis. No bile duct dilatation seen. Pancreas: Diminutive and partially infiltrated with fat but otherwise unremarkable. No evidence of acute pancreatitis. Spleen: Within normal limits in size and appearance. Adrenals/Urinary Tract: Adrenal glands appear normal. Kidneys are unremarkable without mass, stone or hydronephrosis. No ureteral or bladder calculi identified. Bladder appears normal. Stomach/Bowel: Probable  thickening of the walls of the rectosigmoid colon, with additional milder thickening and inflammation of the walls of the transverse and descending colon, suggesting diffuse colitis of infectious or inflammatory nature. Stomach and small bowel appear normal. Appendix is not well seen in its entirety but appears grossly normal and there are no inflammatory changes about the cecum to suggest acute appendicitis. Vascular/Lymphatic: Scattered atherosclerotic changes of the normal caliber abdominal aorta and pelvic vasculature. No enlarged lymph nodes seen in the abdomen or pelvis. Reproductive: No mass or other significant abnormality. Other: Trace free fluid in the lower pelvis. No other free fluid seen. No abscess collections seen. No free intraperitoneal air. Musculoskeletal: Degenerative changes throughout the scoliotic thoracolumbar spine.  No acute or suspicious osseous finding. Superficial soft tissues are unremarkable. IMPRESSION: 1. Thickening of the walls of the rectosigmoid colon, with probable milder thickening of the walls of the transverse and descending colon, suggesting diffuse colitis of infectious or inflammatory nature. No bowel obstruction. Small amount of free fluid in the lower pelvis. No abscess collection or free intraperitoneal air. 2. Remainder of the abdomen and pelvis CT is unremarkable. No evidence of acute solid organ abnormality. No renal or ureteral calculi. 3. Aortic atherosclerosis. Electronically Signed   By: Bary RichardStan  Maynard M.D.   On: 12/03/2015 13:46    Radiological Exams on Admission: Dg Chest 2 View  Result Date: 12/03/2015 CLINICAL DATA:  Low O2 sats, very weak today. EXAM: CHEST  2 VIEW COMPARISON:  Chest x-rays dated 11/21/2015 and 08/13/2015. FINDINGS: Heart size is upper normal, stable. Mediastinal contours remain normal. Overlying loop recorder again noted Lungs are hyperexpanded. No evidence of pneumonia. No pleural effusion or pneumothorax seen. Mild degenerative change  noted within the slightly kyphotic and scoliotic thoracic spine. No acute or suspicious osseous finding. IMPRESSION: No active cardiopulmonary disease. Hyperexpanded lungs suggesting COPD. Electronically Signed   By: Bary RichardStan  Maynard M.D.   On: 12/03/2015 13:12   Ct Abdomen Pelvis W Contrast  Result Date: 12/03/2015 CLINICAL DATA:  Nausea, vomiting and diarrhea for several days. EXAM: CT ABDOMEN AND PELVIS WITH CONTRAST TECHNIQUE: Multidetector CT imaging of the abdomen and pelvis was performed using the standard protocol following bolus administration of intravenous contrast. CONTRAST:  80mL ISOVUE-300 IOPAMIDOL (ISOVUE-300) INJECTION 61% COMPARISON:  None. FINDINGS: Lower chest:  Mild atelectasis/ scarring at each lung base. Hepatobiliary: Liver appears normal. Gallbladder is mildly distended but otherwise unremarkable. No inflammation to suggest acute cholecystitis. No bile duct dilatation seen. Pancreas: Diminutive and partially infiltrated with fat but otherwise unremarkable. No evidence of acute pancreatitis. Spleen: Within normal limits in size and appearance. Adrenals/Urinary Tract: Adrenal glands appear normal. Kidneys are unremarkable without mass, stone or hydronephrosis. No ureteral or bladder calculi identified. Bladder appears normal. Stomach/Bowel: Probable thickening of the walls of the rectosigmoid colon, with additional milder thickening and inflammation of the walls of the transverse and descending colon, suggesting diffuse colitis of infectious or inflammatory nature. Stomach and small bowel appear normal. Appendix is not well seen in its entirety but appears grossly normal and there are no inflammatory changes about the cecum to suggest acute appendicitis. Vascular/Lymphatic: Scattered atherosclerotic changes of the normal caliber abdominal aorta and pelvic vasculature. No enlarged lymph nodes seen in the abdomen or pelvis. Reproductive: No mass or other significant abnormality. Other: Trace  free fluid in the lower pelvis. No other free fluid seen. No abscess collections seen. No free intraperitoneal air. Musculoskeletal: Degenerative changes throughout the scoliotic thoracolumbar spine. No acute or suspicious osseous finding. Superficial soft tissues are unremarkable. IMPRESSION: 1. Thickening of the walls of the rectosigmoid colon, with probable milder thickening of the walls of the transverse and descending colon, suggesting diffuse colitis of infectious or inflammatory nature. No bowel obstruction. Small amount of free fluid in the lower pelvis. No abscess collection or free intraperitoneal air. 2. Remainder of the abdomen and pelvis CT is unremarkable. No evidence of acute solid organ abnormality. No renal or ureteral calculi. 3. Aortic atherosclerosis. Electronically Signed   By: Bary RichardStan  Maynard M.D.   On: 12/03/2015 13:46    DVT Prophylaxis Xarelto  AM Labs Ordered, also please review Full Orders  Family Communication: Admission, patients condition and plan of care including tests being ordered  have been discussed with the patient    Code Status - DNR  Likely DC to  Back to ALF  Condition   stable  Troyce Gieske M.D on 12/03/2015 at 6:59 PM   Between 7am to 7pm - Pager - [phone]  After 7pm go to [email or web address] - password [email or web address]  [facility] - Office  [phone]  Dragon dictation system was used to create this note, attempts have been made to correct errors, however presence of uncorrected errors is not a reflection quality of care provided.

## 2015-12-03 NOTE — ED Notes (Signed)
Pt to CT

## 2015-12-03 NOTE — ED Notes (Signed)
Pt to xray

## 2015-12-03 NOTE — ED Provider Notes (Signed)
[facility]-EMERGENCY DEPT Provider Note   CSN: [id number] Arrival date & time: 12/03/15  [id number]     History   Chief Complaint Chief Complaint  Patient presents with  . Weakness   Level V caveat secondary to dementia HPI Tracey [name] is a 82 y.o. female.  HPI   This is an 82 year old female from a skilled nursing facility who presents via EMS. They report that they were called due to report that she was having an altered level of consciousness. EMS reports that when they arrived she was alert and oriented. They report that she denied any pain to them. She states to me that she feels very weak and that she is having abdominal pain. When I ask her where the abdominal pain is she points to her upper abdomen. Mucous membranes and skin appeared very dry to EMS.  Past Medical History:  Diagnosis Date  . Abnormality of gait 10/17/2012  . Alzheimer disease   . Anemia   . Arthritis    "knees" (12/12/2014)  . Dyslipidemia   . Hemiparesis and alteration of sensations as late effects of stroke (HCC) 06/24/2014  . Hypertension   . Lumbosacral spondylosis   . Memory deficit 10/17/2012  . Spinal stenosis, lumbar   . Stroke (HCC) 2000's   mild left hemiparesis  . Urinary incontinence     Patient Active Problem List   Diagnosis Date Noted  . Nausea 08/14/2015  . Acute combined systolic and diastolic heart failure (HCC) 08/14/2015  . PAF (paroxysmal atrial fibrillation) (HCC) 08/14/2015  . UTI (urinary tract infection) 08/14/2015  . Congestive dilated cardiomyopathy (HCC) 08/14/2015  . NSTEMI (non-ST elevated myocardial infarction) (HCC) 08/13/2015  . Paroxysmal atrial fibrillation (HCC) 02/11/2015  . Cryptogenic stroke (HCC) 01/28/2015  . Bradycardia, sinus   . Dehydration 01/03/2015  . Acute kidney injury (HCC) 01/03/2015  . Elevated transaminase level 01/03/2015  . Elevated troponin 01/03/2015  . Acid reflux 01/03/2015  . CKD (chronic kidney disease), stage III 01/03/2015  . Prolonged QT  interval 01/03/2015  . History of CVA (cerebrovascular accident) 12/12/2014  . FTT (failure to thrive) in adult 12/11/2014  . Altered mental status 06/24/2014  . Hemiparesis and alteration of sensations as late effects of stroke (HCC) 06/24/2014  . 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 Dementia with Lewy bodies   . HLD (hyperlipidemia)   . Memory deficit 10/17/2012  . Abnormality of gait 10/17/2012    Past Surgical History:  Procedure Laterality Date  . CATARACT EXTRACTION W/ INTRAOCULAR LENS  IMPLANT, BILATERAL Bilateral   . EP IMPLANTABLE DEVICE N/A 09/08/2014   Procedure: Loop Recorder Insertion;  Surgeon: Marinus MawGregg W Taylor, MD;  Location: [facility];  Service: Cardiovascular;  Laterality: N/A;  . TUBAL LIGATION Bilateral     OB History    No data available       Home Medications    Prior to Admission medications   Medication Sig Start Date End Date Taking? Authorizing Provider  acetaminophen (TYLENOL) 500 MG tablet Take 500 mg by mouth 2 (two) times daily. May also give q6hprn   Yes Historical Provider, MD  acetaminophen-codeine (TYLENOL #2) 300-15 MG tablet Take 1 tablet by mouth daily.   Yes Historical Provider, MD  clobetasol (TEMOVATE) 0.05 % external solution Apply 1 application topically daily. Use mon, tue, wed, thur, fri for itching   Yes Historical Provider, MD  DULoxetine (CYMBALTA) 30 MG capsule Take 30 mg by mouth 2 (two) times daily.  Yes Historical Provider, MD  feeding supplement (BOOST HIGH PROTEIN) LIQD Take 1 Container by mouth 2 (two) times daily between meals.   Yes Historical Provider, MD  hydrocortisone cream 1 % Apply 1 application topically See admin instructions. Use mon, tue, wed, thu, fri   Yes Historical Provider, MD  lamoTRIgine (LAMICTAL) 25 MG tablet Take 25 mg by mouth 2 (two) times daily.   Yes Historical Provider, MD  lidocaine (LIDODERM) 5 % Place 1 patch onto the skin daily. Remove & Discard patch within 12  hours or as directed by MD   Yes Historical Provider, MD  lisinopril (PRINIVIL,ZESTRIL) 2.5 MG tablet Take 1 tablet (2.5 mg total) by mouth daily. 08/17/15  Yes Clydia Llano, MD  loratadine (CLARITIN) 10 MG tablet Take 10 mg by mouth daily.   Yes Historical Provider, MD  Melatonin 3 MG CAPS Take 3 mg by mouth at bedtime.   Yes Historical Provider, MD  Omega-3 Fatty Acids (FISH OIL) 1000 MG CAPS Take 1,000 mg by mouth daily.   Yes Historical Provider, MD  omeprazole (PRILOSEC) 20 MG capsule Take 20 mg by mouth daily.   Yes Historical Provider, MD  PENNSAID 2 % SOLN Place 2 application onto the skin 2 (two) times daily. 08/11/15  Yes Historical Provider, MD  polyethylene glycol (MIRALAX / GLYCOLAX) packet Take 17 g by mouth every other day. 12/13/14  Yes Shanker Levora Dredge, MD  promethazine (PHENERGAN) 25 MG tablet Take 25 mg by mouth every 8 (eight) hours as needed for nausea or vomiting.   Yes Historical Provider, MD  Rivaroxaban (XARELTO) 15 MG TABS tablet Take 1 tablet (15 mg total) by mouth daily. 07/21/15  Yes Marinus Maw, MD    Family History Family History  Problem Relation Age of Onset  . Parkinsonism Brother   . Dementia Brother   . Heart disease Brother     Social History Social History  Substance Use Topics  . Smoking status: Never Smoker  . Smokeless tobacco: Never Used  . Alcohol use 4.2 oz/week    7 Glasses of wine per week     Comment: 1 glass of wine per day     Allergies   Namenda [memantine hcl]; Alendronate sodium; and Simvastatin   Review of Systems Review of Systems  Unable to perform ROS: Dementia     Physical Exam Updated Vital Signs BP 102/80   Pulse (!) 49   Temp 98.6 F (37 C) (Axillary)   Resp 19   Ht 5' (1.524 m)   Wt 55.3 kg   SpO2 (!) 88%   BMI 23.83 kg/m   Physical Exam  Constitutional: She appears well-developed and well-nourished. She appears distressed.  HENT:  Head: Normocephalic and atraumatic.  Right Ear: External ear normal.    Left Ear: External ear normal.  Mucous membranes and lips are very dry  Eyes: Conjunctivae are normal.  Conjunctiva are pale  Neck: Normal range of motion. Neck supple.  Cardiovascular:  Irregularly irregular  Pulmonary/Chest: Effort normal and breath sounds normal.  Abdominal: Soft. Bowel sounds are normal.  Mild epigastric tenderness with pulsatile mass  Musculoskeletal: Normal range of motion.  Neurological: She is alert.  Patient alert and oriented to being in the hospital but not oriented to date or specific institution  Skin: Skin is warm. Capillary refill takes less than 2 seconds.  Skin with yellowish cast  Nursing note and vitals reviewed.    ED Treatments / Results  Labs (all labs ordered are  listed, but only abnormal results are displayed) Labs Reviewed  BASIC METABOLIC PANEL - Abnormal; Notable for the following:       Result Value   CO2 18 (*)    Creatinine, Ser 1.26 (*)    GFR calc non Af Amer 38 (*)    GFR calc Af Amer 44 (*)    All other components within normal limits  CBC - Abnormal; Notable for the following:    Hemoglobin 11.7 (*)    RDW 15.6 (*)    All other components within normal limits  URINALYSIS, ROUTINE W REFLEX MICROSCOPIC (NOT AT [facility]) - Abnormal; Notable for the following:    APPearance HAZY (*)    Hgb urine dipstick MODERATE (*)    Bilirubin Urine SMALL (*)    Ketones, ur 40 (*)    Protein, ur 30 (*)    Nitrite POSITIVE (*)    All other components within normal limits  COMPREHENSIVE METABOLIC PANEL - Abnormal; Notable for the following:    CO2 17 (*)    Creatinine, Ser 1.32 (*)    Total Protein 5.8 (*)    ALT 13 (*)    GFR calc non Af Amer 36 (*)    GFR calc Af Amer 42 (*)    Anion gap 17 (*)    All other components within normal limits  URINE MICROSCOPIC-ADD ON - Abnormal; Notable for the following:    Squamous Epithelial / LPF 0-5 (*)    Bacteria, UA MANY (*)    All other components within normal limits  TROPONIN I  OCCULT  BLOOD X 1 CARD TO LAB, STOOL  CBG MONITORING, ED  POC OCCULT BLOOD, ED    EKG  EKG Interpretation  Date/Time:  Thursday December 03 2015 10:00:07 EDT Ventricular Rate:  120 PR Interval:    QRS Duration: 80 QT Interval:  391 QTC Calculation: 560 R Axis:   -51 Text Interpretation:  atrial flutter  Left anterior fascicular block Anteroseptal infarct, age indeterminate Prolonged QT interval No significant change since last tracing Confirmed by Willson Lipa MD, Duwayne HeckANIELLE [phone]([id number]) on 12/03/2015 12:28:19 PM       Radiology Dg Chest 2 View  Result Date: 12/03/2015 CLINICAL DATA:  Low O2 sats, very weak today. EXAM: CHEST  2 VIEW COMPARISON:  Chest x-rays dated 11/21/2015 and 08/13/2015. FINDINGS: Heart size is upper normal, stable. Mediastinal contours remain normal. Overlying loop recorder again noted Lungs are hyperexpanded. No evidence of pneumonia. No pleural effusion or pneumothorax seen. Mild degenerative change noted within the slightly kyphotic and scoliotic thoracic spine. No acute or suspicious osseous finding. IMPRESSION: No active cardiopulmonary disease. Hyperexpanded lungs suggesting COPD. Electronically Signed   By: Bary RichardStan  Maynard M.D.   On: 12/03/2015 13:12   Ct Abdomen Pelvis W Contrast  Result Date: 12/03/2015 CLINICAL DATA:  Nausea, vomiting and diarrhea for several days. EXAM: CT ABDOMEN AND PELVIS WITH CONTRAST TECHNIQUE: Multidetector CT imaging of the abdomen and pelvis was performed using the standard protocol following bolus administration of intravenous contrast. CONTRAST:  80mL ISOVUE-300 IOPAMIDOL (ISOVUE-300) INJECTION 61% COMPARISON:  None. FINDINGS: Lower chest:  Mild atelectasis/ scarring at each lung base. Hepatobiliary: Liver appears normal. Gallbladder is mildly distended but otherwise unremarkable. No inflammation to suggest acute cholecystitis. No bile duct dilatation seen. Pancreas: Diminutive and partially infiltrated with fat but otherwise unremarkable. No evidence of  acute pancreatitis. Spleen: Within normal limits in size and appearance. Adrenals/Urinary Tract: Adrenal glands appear normal. Kidneys are unremarkable without mass, stone or hydronephrosis.  No ureteral or bladder calculi identified. Bladder appears normal. Stomach/Bowel: Probable thickening of the walls of the rectosigmoid colon, with additional milder thickening and inflammation of the walls of the transverse and descending colon, suggesting diffuse colitis of infectious or inflammatory nature. Stomach and small bowel appear normal. Appendix is not well seen in its entirety but appears grossly normal and there are no inflammatory changes about the cecum to suggest acute appendicitis. Vascular/Lymphatic: Scattered atherosclerotic changes of the normal caliber abdominal aorta and pelvic vasculature. No enlarged lymph nodes seen in the abdomen or pelvis. Reproductive: No mass or other significant abnormality. Other: Trace free fluid in the lower pelvis. No other free fluid seen. No abscess collections seen. No free intraperitoneal air. Musculoskeletal: Degenerative changes throughout the scoliotic thoracolumbar spine. No acute or suspicious osseous finding. Superficial soft tissues are unremarkable. IMPRESSION: 1. Thickening of the walls of the rectosigmoid colon, with probable milder thickening of the walls of the transverse and descending colon, suggesting diffuse colitis of infectious or inflammatory nature. No bowel obstruction. Small amount of free fluid in the lower pelvis. No abscess collection or free intraperitoneal air. 2. Remainder of the abdomen and pelvis CT is unremarkable. No evidence of acute solid organ abnormality. No renal or ureteral calculi. 3. Aortic atherosclerosis. Electronically Signed   By: Bary Richard M.D.   On: 12/03/2015 13:46    Procedures Procedures (including critical care time) EMERGENCY DEPARTMENT [place] ABD/AORTA EXAM Study: Limited Ultrasound of the Abdominal  Aorta. INDICATIONS:Abdominal pain Indication: Multiple views of the abdominal aorta are obtained from the diaphragmatic hiatus to the aortic bifurcation in transverse and sagittal planes with a multi- Frequency probe. PERFORMED BY: Myself IMAGES ARCHIVED?: Yes FINDINGS: Maximum aortic dimensions are 2.2.cm LIMITATIONS:  Bowel gas  INTERPRETATION:  No abdominal aortic aneurysm  Medications Ordered in ED Medications  sodium chloride 0.9 % bolus 500 mL (not administered)     Initial Impression / Assessment and Plan / ED Course  I have reviewed the triage vital signs and the nursing notes.  Pertinent labs & imaging results that were available during my care of the patient were reviewed by me and considered in my medical decision making (see chart for details). This is an 82 year old female with dementia who presents today with generalized weakness and reports that she has not been eating or drinking very well. She has also had some episodes of vomiting. Workup here reveals colitis seen on CT scan. She has some mild renal insufficiency. Urine is significant for nitrite positive. Plan culture. Report of history states that she has QT prolongation. I am obtaining an EKG currently to be able to assess QT prolongation and decide on appropriate antibiotic course. Plan admission to hospital for IV fluids and observation. 1- colitis 2- renal insufficiency- stable 3-atrial flutter prolonged qt- stable 4- dementia  Clinical Course   Patient being given iv fluids and ct abdomen pending.   Final Clinical Impressions(s) / ED Diagnoses  1- colitis 2- renal insufficiency- stable 3-atrial flutter prolonged qt- stable 4- dementia   Discussed with Dr. Mariea Clonts and he will see and admit New Prescriptions New Prescriptions   No medications on file       Margarita Grizzle, MD 12/03/15 [phone]

## 2015-12-03 NOTE — ED Notes (Signed)
Daughter Chip BoerVicki [phone]

## 2015-12-03 NOTE — ED Notes (Signed)
IV nurse at bedside.

## 2015-12-04 DIAGNOSIS — I4892 Unspecified atrial flutter: Secondary | ICD-10-CM | POA: Diagnosis present

## 2015-12-04 DIAGNOSIS — E43 Unspecified severe protein-calorie malnutrition: Secondary | ICD-10-CM | POA: Diagnosis present

## 2015-12-04 DIAGNOSIS — E785 Hyperlipidemia, unspecified: Secondary | ICD-10-CM | POA: Diagnosis present

## 2015-12-04 DIAGNOSIS — R531 Weakness: Secondary | ICD-10-CM | POA: Diagnosis present

## 2015-12-04 DIAGNOSIS — I10 Essential (primary) hypertension: Secondary | ICD-10-CM | POA: Diagnosis present

## 2015-12-04 DIAGNOSIS — A09 Infectious gastroenteritis and colitis, unspecified: Secondary | ICD-10-CM | POA: Diagnosis not present

## 2015-12-04 DIAGNOSIS — Z9841 Cataract extraction status, right eye: Secondary | ICD-10-CM | POA: Diagnosis not present

## 2015-12-04 DIAGNOSIS — Z66 Do not resuscitate: Secondary | ICD-10-CM | POA: Diagnosis present

## 2015-12-04 DIAGNOSIS — F0280 Dementia in other diseases classified elsewhere without behavioral disturbance: Secondary | ICD-10-CM | POA: Diagnosis present

## 2015-12-04 DIAGNOSIS — G309 Alzheimer's disease, unspecified: Secondary | ICD-10-CM | POA: Diagnosis present

## 2015-12-04 DIAGNOSIS — Z6823 Body mass index (BMI) 23.0-23.9, adult: Secondary | ICD-10-CM | POA: Diagnosis not present

## 2015-12-04 DIAGNOSIS — Z8673 Personal history of transient ischemic attack (TIA), and cerebral infarction without residual deficits: Secondary | ICD-10-CM | POA: Diagnosis not present

## 2015-12-04 DIAGNOSIS — G8194 Hemiplegia, unspecified affecting left nondominant side: Secondary | ICD-10-CM | POA: Diagnosis present

## 2015-12-04 DIAGNOSIS — Z9842 Cataract extraction status, left eye: Secondary | ICD-10-CM | POA: Diagnosis not present

## 2015-12-04 DIAGNOSIS — I4581 Long QT syndrome: Secondary | ICD-10-CM | POA: Diagnosis present

## 2015-12-04 DIAGNOSIS — Z961 Presence of intraocular lens: Secondary | ICD-10-CM | POA: Diagnosis present

## 2015-12-04 LAB — GASTROINTESTINAL PANEL BY PCR, STOOL (REPLACES STOOL CULTURE)
ADENOVIRUS F40/41: NOT DETECTED
ASTROVIRUS: NOT DETECTED
CAMPYLOBACTER SPECIES: NOT DETECTED
CRYPTOSPORIDIUM: NOT DETECTED
Cyclospora cayetanensis: NOT DETECTED
E. coli O157: NOT DETECTED
ENTEROAGGREGATIVE E COLI (EAEC): NOT DETECTED
ENTEROPATHOGENIC E COLI (EPEC): NOT DETECTED
ENTEROTOXIGENIC E COLI (ETEC): NOT DETECTED
Entamoeba histolytica: NOT DETECTED
Giardia lamblia: NOT DETECTED
NOROVIRUS GI/GII: NOT DETECTED
PLESIMONAS SHIGELLOIDES: NOT DETECTED
ROTAVIRUS A: NOT DETECTED
SHIGA LIKE TOXIN PRODUCING E COLI (STEC): NOT DETECTED
Salmonella species: NOT DETECTED
Sapovirus (I, II, IV, and V): NOT DETECTED
Shigella/Enteroinvasive E coli (EIEC): NOT DETECTED
Vibrio cholerae: NOT DETECTED
Vibrio species: NOT DETECTED
Yersinia enterocolitica: NOT DETECTED

## 2015-12-04 LAB — BASIC METABOLIC PANEL
Anion gap: 9 (ref 5–15)
BUN: 14 mg/dL (ref 6–20)
CALCIUM: 9.2 mg/dL (ref 8.9–10.3)
CO2: 23 mmol/L (ref 22–32)
CREATININE: 1.04 mg/dL — AB (ref 0.44–1.00)
Chloride: 103 mmol/L (ref 101–111)
GFR calc non Af Amer: 48 mL/min — ABNORMAL LOW (ref >60)
GFR, EST AFRICAN AMERICAN: 56 mL/min — AB (ref >60)
Glucose, Bld: 126 mg/dL — ABNORMAL HIGH (ref 65–99)
Potassium: 3.9 mmol/L (ref 3.5–5.1)
SODIUM: 135 mmol/L (ref 135–145)

## 2015-12-04 LAB — CBC
HCT: 32.6 % — ABNORMAL LOW (ref 36.0–46.0)
Hemoglobin: 10.4 g/dL — ABNORMAL LOW (ref 12.0–15.0)
MCH: 29.5 pg (ref 26.0–34.0)
MCHC: 31.9 g/dL (ref 30.0–36.0)
MCV: 92.4 fL (ref 78.0–100.0)
PLATELETS: 202 10*3/uL (ref 150–400)
RBC: 3.53 MIL/uL — AB (ref 3.87–5.11)
RDW: 15.5 % (ref 11.5–15.5)
WBC: 7.6 10*3/uL (ref 4.0–10.5)

## 2015-12-04 LAB — MRSA PCR SCREENING: MRSA BY PCR: NEGATIVE

## 2015-12-04 LAB — C DIFFICILE QUICK SCREEN W PCR REFLEX
C DIFFICLE (CDIFF) ANTIGEN: POSITIVE — AB
C Diff toxin: NEGATIVE

## 2015-12-04 LAB — CLOSTRIDIUM DIFFICILE BY PCR: Toxigenic C. Difficile by PCR: NEGATIVE

## 2015-12-04 MED ORDER — SODIUM CHLORIDE 0.9 % IV SOLN: 250.0000 mL | INTRAVENOUS | Status: DC | PRN

## 2015-12-04 MED ORDER — PRO-STAT SUGAR FREE PO LIQD
30.0000 mL | Freq: Two times a day (BID) | ORAL | Status: DC
  Administered 2015-12-04 – 2015-12-05 (×2): 30 mL via ORAL
  Filled 2015-12-04 (×2): qty 30

## 2015-12-04 NOTE — Progress Notes (Signed)
Initial Nutrition Assessment  DOCUMENTATION CODES:   Severe malnutrition in context of chronic illness  INTERVENTION:   -Continue Boost High Protein BID, each supplement provides 360 kcals and 16 grams protein -30 ml Prostat BID, each supplement provides 100 kcals and 15 grams protein  NUTRITION DIAGNOSIS:   Malnutrition related to chronic illness as evidenced by energy intake < or equal to 75% for > or equal to 1 month, moderate depletion of body fat, severe depletion of body fat, moderate depletions of muscle mass, severe depletion of muscle mass.  GOAL:   Patient will meet greater than or equal to 90% of their needs  MONITOR:   PO intake, Supplement acceptance, Diet advancement, Labs, Weight trends, Skin, I & O's  REASON FOR ASSESSMENT:   Malnutrition Screening Tool    ASSESSMENT:    Tracey [name]  is a 82 y.o. female, with h/o memory deficits, Afib and prior CVA presents from ALF with c/o Nausea, abd pain and emesis.    Pt admitted with infectious colitis.   [facility]; pt on a regular diet PTA. Also received 3 mg melatonin, 100 mg fish oil, and Boost High Protein BID daily.   Hx obtained mainly from pt daughter Tracey Beach(Vickie) at bedside. She reports Tracey [name] tolerated her liquids this AM. PTA she estimates pt had a poor appetite over the past 1-2 months. Per pt daughter report, pt experienced taste changes ("nothing tastes right") and pt consumed only small amounts at meals. Pt daughter suspects that this is a side effect from Cymbalta. Confirmed pt was receiving Boost supplements, however, pt usually only takes a few sips of them (which is consistent with what pt is doing currently; observed pt consumed less than 25% of Ensure supplement at bedside).   Per pt daughter, pt UBW is around 120#. Per wt hx, pt has regained some of the weight she has lost previously.   Nutrition-Focused physical exam completed. Findings are moderate to severe fat  depletion, moderate to severe muscle depletion, and no edema.   Discussed options for optimizing pt's calorie and protein intake. Pt daughter is amenable to try Prostat.   Case discussed with RN.  Labs reviewed.  Diet Order:  Diet full liquid Room service appropriate? Yes; Fluid consistency: Thin  Skin:  Reviewed, no issues  Last BM:  12/03/15  Height:   Ht Readings from Last 1 Encounters:  12/03/15 5' (1.524 m)    Weight:   Wt Readings from Last 1 Encounters:  12/03/15 122 lb (55.3 kg)    Ideal Body Weight:  45.5 kg  BMI:  Body mass index is 23.83 kg/m.  Estimated Nutritional Needs:   Kcal:  1450-1650  Protein:  70-85 grams  Fluid:  1.4-1.6 L  EDUCATION NEEDS:   Education needs addressed  [name] KnifeWilliams, RD, LDN, CDE Pager: [phone] After hours Pager: [phone]

## 2015-12-04 NOTE — Progress Notes (Signed)
Patient Demographics:    Tracey [name], is a 82 y.o. female, DOB - Nov 19, 1930, ZOX:[id number]  Admit date - 12/03/2015   Admitting Physician Khani Paino Mariea Clonts, MD  Outpatient Primary MD for the patient is Darrow Bussing, MD  LOS - 0   Chief Complaint  Patient presents with  . Weakness        Subjective:    Tracey [name] today has no fevers, no emesis,  No chest pain, w/o New complaints, tolerating liquid diet well, had a formed BM, no diarrhea   Assessment  & Plan :    Principal Problem:   Colitis, infectious Active Problems:   Colitis  1)Acute Colitis- Discussed with Dr. Nickie Retort on 12/03/15, Unable to use quinolones due to concerns about QT prolongation,c/n Bactrim DS 1 twice a day and IV Flagyl 500mg   3 times a day . stool culture and  stool for C. difficile if loose stools. Advance to full liquid diet  2)H/o CVA/Paroxysmal Atrial fibrillation-continue Xarelto   3)FEN-  creatinine trended back down with IV fluids,, hold lisinopril while on Bactrim to avoid AKI and Hyperkalemia  Code Status : DNR   Disposition Plan  : Back to assisted living facility in 1-2 days    DVT Prophylaxis  :  xarelto  Lab Results  Component Value Date   PLT 202 12/04/2015    Inpatient Medications  Scheduled Meds: . acetaminophen  500 mg Oral BID  . acetaminophen-codeine  1 tablet Oral Daily  . clobetasol ointment   Topical Once per day on Mon Tue Wed Thu Fri  . diclofenac sodium  2 g Topical BID  . DULoxetine  30 mg Oral BID  . feeding supplement  1 Container Oral BID BM  . hydrocortisone cream  1 application Topical Once per day on Mon Tue Wed Thu Fri  . lamoTRIgine  25 mg Oral BID  . lidocaine  1 patch Transdermal Q24H  . loratadine  10 mg Oral Daily  . Melatonin  3 mg Oral QHS  . metronidazole  500 mg Intravenous Q8H  . omega-3 acid ethyl esters  1 g Oral Daily  . pantoprazole  40 mg Oral Daily  .  polyethylene glycol  17 g Oral QODAY  . Rivaroxaban  15 mg Oral Q supper  . senna  1 tablet Oral BID  . sodium chloride flush  3 mL Intravenous Q12H  . sulfamethoxazole-trimethoprim  1 tablet Oral Q12H   Continuous Infusions:  PRN Meds:.sodium chloride, acetaminophen **OR** acetaminophen, albuterol, ondansetron **OR** ondansetron (ZOFRAN) IV, polyethylene glycol, promethazine, sodium chloride flush, traZODone    Anti-infectives    Start     Dose/Rate Route Frequency Ordered Stop   12/03/15 2200  sulfamethoxazole-trimethoprim (BACTRIM DS,SEPTRA DS) 800-160 MG per tablet 1 tablet     1 tablet Oral Every 12 hours 12/03/15 1847     12/03/15 2000  metroNIDAZOLE (FLAGYL) IVPB 500 mg     500 mg 100 mL/hr over 60 Minutes Intravenous Every 8 hours 12/03/15 1847     12/03/15 1615  ertapenem (INVANZ) 0.5 g in sodium chloride 0.9 % 50 mL IVPB  Status:  Discontinued     500 mg 100 mL/hr over 30 Minutes Intravenous Every 24 hours 12/03/15 1619 12/03/15 1629   12/03/15  1500  cefTRIAXone (ROCEPHIN) 1 g in dextrose 5 % 50 mL IVPB     1 g 100 mL/hr over 30 Minutes Intravenous  Once 12/03/15 1450 12/03/15 1815   12/03/15 1500  metroNIDAZOLE (FLAGYL) IVPB 500 mg     500 mg 100 mL/hr over 60 Minutes Intravenous  Once 12/03/15 1450 12/03/15 1826        Objective:   Vitals:   12/03/15 1645 12/03/15 1800 12/03/15 1900 12/04/15 0700  BP: 106/67 100/90 133/72 122/69  Pulse: 116 73 87 78  Resp: (!) 30     Temp:   97.9 F (36.6 C) 98 F (36.7 C)  TempSrc:   Oral Oral  SpO2: 98% 100% 100% 100%  Weight:      Height:        Wt Readings from Last 3 Encounters:  12/03/15 55.3 kg (122 lb)  08/17/15 55.6 kg (122 lb 9.2 oz)  08/07/15 51.7 kg (114 lb)     Intake/Output Summary (Last 24 hours) at 12/04/15 0838 Last data filed at 12/04/15 0455  Gross per 24 hour  Intake              440 ml  Output                0 ml  Net              440 ml     Physical Exam  Gen:- Awake Alert, Cachectic  appearing, Pleasantly confused, oriented to self only HEENT:- [place].AT, No sclera icterus Neck-Supple Neck,No JVD,.  Lungs-  CTAB  CV- S1, S2 normal Abd-  +ve B.Sounds, Abd Soft, improved lower quadrant tenderness,   no rebound or guarding, no CVA area tenderness Extremity/Skin:- No  edema,    NeuroPsych- no acute neuro deficits, cognitive deficits noted (not new)    Data Review:   Micro Results No results found for this or any previous visit (from the past 240 hour(s)).  Radiology Reports Dg Chest 2 View  Result Date: 12/03/2015 CLINICAL DATA:  Low O2 sats, very weak today. EXAM: CHEST  2 VIEW COMPARISON:  Chest x-rays dated 11/21/2015 and 08/13/2015. FINDINGS: Heart size is upper normal, stable. Mediastinal contours remain normal. Overlying loop recorder again noted Lungs are hyperexpanded. No evidence of pneumonia. No pleural effusion or pneumothorax seen. Mild degenerative change noted within the slightly kyphotic and scoliotic thoracic spine. No acute or suspicious osseous finding. IMPRESSION: No active cardiopulmonary disease. Hyperexpanded lungs suggesting COPD. Electronically Signed   By: Bary RichardStan  Maynard M.D.   On: 12/03/2015 13:12   Dg Chest 2 View  Result Date: 11/21/2015 CLINICAL DATA:  82 year old female with altered mental status and possible sepsis EXAM: CHEST  2 VIEW COMPARISON:  Chest radiograph dated 08/13/2015 FINDINGS: Two views of the chest demonstrate emphysematous changes of the lungs. There is no focal consolidation, pleural effusion, or pneumothorax. Top-normal cardiac silhouette. Cardiac monitor device is noted. There is osteopenia with multilevel compression fracture of the thoracic spine, age indeterminate, likely old and similar to prior study. No definite acute fracture. IMPRESSION: No active cardiopulmonary disease. Electronically Signed   By: Elgie CollardArash  Radparvar M.D.   On: 11/21/2015 21:15   Ct Head Wo Contrast  Result Date: 11/21/2015 CLINICAL DATA:  Increasing  confusion over the past few days. Altered mental status. EXAM: CT HEAD WITHOUT CONTRAST CT CERVICAL SPINE WITHOUT CONTRAST TECHNIQUE: Multidetector CT imaging of the head and cervical spine was performed following the standard protocol without intravenous contrast. Multiplanar CT image  reconstructions of the cervical spine were also generated. COMPARISON:  Head CT 08/13/2015. Head and cervical spine CT 01/03/2015 FINDINGS: CT HEAD FINDINGS No intracranial hemorrhage, mass effect, or midline shift. Stable generalized atrophy. Stable advanced chronic small vessel ischemic change. Remote prior infarcts in the bifrontal lobes. No hydrocephalus. The basilar cisterns are patent. No evidence of territorial infarct. No intracranial fluid collection. Atherosclerosis of skullbase vasculature. Calvarium is intact. Improved paranasal sinus mucosal thickening from prior. Mastoid air cells are well aerated. CT CERVICAL SPINE FINDINGS Flattening of C5 and C6 vertebral bodies, unchanged. Severe C5-C6 disc space loss and endplate spurring, unchanged. Progressive degenerative disc disease at C6-C7 with vacuum phenomenon. No acute fracture or subluxation. Bony ankylosis of C2-3 C4 facets on the right are unchanged. The dens is intact without fracture. There are no jumped or perched facets. Multilevel neural foraminal stenosis. No prevertebral soft tissue edema. Biapical pleural parenchymal scarring, right greater than left. IMPRESSION: 1. No acute intracranial abnormality. Advanced chronic small vessel ischemia, atrophy, and remote prior infarcts unchanged. 2. Multilevel degenerative change in the cervical spine, with mild progression of degenerative disc disease at C6-C7 from prior. No acute bony abnormality. Electronically Signed   By: Rubye OaksMelanie  Ehinger M.D.   On: 11/21/2015 22:35   Ct Cervical Spine Wo Contrast  Result Date: 11/21/2015 CLINICAL DATA:  Increasing confusion over the past few days. Altered mental status. EXAM: CT  HEAD WITHOUT CONTRAST CT CERVICAL SPINE WITHOUT CONTRAST TECHNIQUE: Multidetector CT imaging of the head and cervical spine was performed following the standard protocol without intravenous contrast. Multiplanar CT image reconstructions of the cervical spine were also generated. COMPARISON:  Head CT 08/13/2015. Head and cervical spine CT 01/03/2015 FINDINGS: CT HEAD FINDINGS No intracranial hemorrhage, mass effect, or midline shift. Stable generalized atrophy. Stable advanced chronic small vessel ischemic change. Remote prior infarcts in the bifrontal lobes. No hydrocephalus. The basilar cisterns are patent. No evidence of territorial infarct. No intracranial fluid collection. Atherosclerosis of skullbase vasculature. Calvarium is intact. Improved paranasal sinus mucosal thickening from prior. Mastoid air cells are well aerated. CT CERVICAL SPINE FINDINGS Flattening of C5 and C6 vertebral bodies, unchanged. Severe C5-C6 disc space loss and endplate spurring, unchanged. Progressive degenerative disc disease at C6-C7 with vacuum phenomenon. No acute fracture or subluxation. Bony ankylosis of C2-3 C4 facets on the right are unchanged. The dens is intact without fracture. There are no jumped or perched facets. Multilevel neural foraminal stenosis. No prevertebral soft tissue edema. Biapical pleural parenchymal scarring, right greater than left. IMPRESSION: 1. No acute intracranial abnormality. Advanced chronic small vessel ischemia, atrophy, and remote prior infarcts unchanged. 2. Multilevel degenerative change in the cervical spine, with mild progression of degenerative disc disease at C6-C7 from prior. No acute bony abnormality. Electronically Signed   By: Rubye OaksMelanie  Ehinger M.D.   On: 11/21/2015 22:35   Ct Abdomen Pelvis W Contrast  Result Date: 12/03/2015 CLINICAL DATA:  Nausea, vomiting and diarrhea for several days. EXAM: CT ABDOMEN AND PELVIS WITH CONTRAST TECHNIQUE: Multidetector CT imaging of the abdomen and  pelvis was performed using the standard protocol following bolus administration of intravenous contrast. CONTRAST:  80mL ISOVUE-300 IOPAMIDOL (ISOVUE-300) INJECTION 61% COMPARISON:  None. FINDINGS: Lower chest:  Mild atelectasis/ scarring at each lung base. Hepatobiliary: Liver appears normal. Gallbladder is mildly distended but otherwise unremarkable. No inflammation to suggest acute cholecystitis. No bile duct dilatation seen. Pancreas: Diminutive and partially infiltrated with fat but otherwise unremarkable. No evidence of acute pancreatitis. Spleen: Within normal limits in size  and appearance. Adrenals/Urinary Tract: Adrenal glands appear normal. Kidneys are unremarkable without mass, stone or hydronephrosis. No ureteral or bladder calculi identified. Bladder appears normal. Stomach/Bowel: Probable thickening of the walls of the rectosigmoid colon, with additional milder thickening and inflammation of the walls of the transverse and descending colon, suggesting diffuse colitis of infectious or inflammatory nature. Stomach and small bowel appear normal. Appendix is not well seen in its entirety but appears grossly normal and there are no inflammatory changes about the cecum to suggest acute appendicitis. Vascular/Lymphatic: Scattered atherosclerotic changes of the normal caliber abdominal aorta and pelvic vasculature. No enlarged lymph nodes seen in the abdomen or pelvis. Reproductive: No mass or other significant abnormality. Other: Trace free fluid in the lower pelvis. No other free fluid seen. No abscess collections seen. No free intraperitoneal air. Musculoskeletal: Degenerative changes throughout the scoliotic thoracolumbar spine. No acute or suspicious osseous finding. Superficial soft tissues are unremarkable. IMPRESSION: 1. Thickening of the walls of the rectosigmoid colon, with probable milder thickening of the walls of the transverse and descending colon, suggesting diffuse colitis of infectious or  inflammatory nature. No bowel obstruction. Small amount of free fluid in the lower pelvis. No abscess collection or free intraperitoneal air. 2. Remainder of the abdomen and pelvis CT is unremarkable. No evidence of acute solid organ abnormality. No renal or ureteral calculi. 3. Aortic atherosclerosis. Electronically Signed   By: Bary Richard M.D.   On: 12/03/2015 13:46   Dg Shoulder Left  Result Date: 11/21/2015 CLINICAL DATA:  Left shoulder pain. Patient's daughter reports pain for months, history of fluid removed from left shoulder. EXAM: LEFT SHOULDER - 2+ VIEW COMPARISON:  No prior shoulder imaging. FINDINGS: There is no evidence of fracture or dislocation. Mild proliferative change at the acromioclavicular joint. No bony destructive change. Soft tissues are unremarkable. IMPRESSION: Mild proliferative change at the acromioclavicular joint. No acute osseous abnormality. Electronically Signed   By: Rubye Oaks M.D.   On: 11/21/2015 22:11     CBC  Recent Labs Lab 12/03/15 1050 12/04/15 0522  WBC 6.4 7.6  HGB 11.7* 10.4*  HCT 36.6 32.6*  PLT 207 202  MCV 93.6 92.4  MCH 29.9 29.5  MCHC 32.0 31.9  RDW 15.6* 15.5    Chemistries   Recent Labs Lab 12/03/15 1050 12/04/15 0522  NA 137  136 135  K 4.7  4.7 3.9  CL 103  103 103  CO2 17*  18* 23  GLUCOSE 85  86 126*  BUN 19  18 14   CREATININE 1.32*  1.26* 1.04*  CALCIUM 9.6  9.6 9.2  AST 25  --   ALT 13*  --   ALKPHOS 49  --   BILITOT 1.1  --    ------------------------------------------------------------------------------------------------------------------ No results for input(s): CHOL, HDL, LDLCALC, TRIG, CHOLHDL, LDLDIRECT in the last 72 hours.  Lab Results  Component Value Date   HGBA1C 5.8 (H) 04/25/2014   ------------------------------------------------------------------------------------------------------------------ No results for input(s): TSH, T4TOTAL, T3FREE, THYROIDAB in the last 72  hours.  Invalid input(s): FREET3 ------------------------------------------------------------------------------------------------------------------ No results for input(s): VITAMINB12, FOLATE, FERRITIN, TIBC, IRON, RETICCTPCT in the last 72 hours.  Coagulation profile No results for input(s): INR, PROTIME in the last 168 hours.  No results for input(s): DDIMER in the last 72 hours.  Cardiac Enzymes  Recent Labs Lab 12/03/15 1050  TROPONINI <0.03   ------------------------------------------------------------------------------------------------------------------ No results found for: BNP   Jhoanna Heyde M.D on 12/04/2015 at 8:38 AM  Between 7am to 7pm - Pager -  [phone]  After 7pm go to [email or web address] - password [email or web address]  [facility] -  Office  [phone]  Dragon dictation system was used to create this note, attempts have been made to correct errors, however presence of uncorrected errors is not a reflection quality of care provided

## 2015-12-05 DIAGNOSIS — E43 Unspecified severe protein-calorie malnutrition: Secondary | ICD-10-CM | POA: Insufficient documentation

## 2015-12-05 LAB — BASIC METABOLIC PANEL
Anion gap: 7 (ref 5–15)
BUN: 7 mg/dL (ref 6–20)
CO2: 24 mmol/L (ref 22–32)
CREATININE: 1.08 mg/dL — AB (ref 0.44–1.00)
Calcium: 9.1 mg/dL (ref 8.9–10.3)
Chloride: 105 mmol/L (ref 101–111)
GFR, EST AFRICAN AMERICAN: 53 mL/min — AB (ref >60)
GFR, EST NON AFRICAN AMERICAN: 46 mL/min — AB (ref >60)
Glucose, Bld: 94 mg/dL (ref 65–99)
Potassium: 4.2 mmol/L (ref 3.5–5.1)
SODIUM: 136 mmol/L (ref 135–145)

## 2015-12-05 MED ORDER — SULFAMETHOXAZOLE-TRIMETHOPRIM 800-160 MG PO TABS: 1.0000 | ORAL_TABLET | Freq: Two times a day (BID) | ORAL | 0 refills | Status: AC

## 2015-12-05 MED ORDER — ACETAMINOPHEN-CODEINE #2 300-15 MG PO TABS: 1.0000 | ORAL_TABLET | Freq: Every day | ORAL | 0 refills | Status: AC

## 2015-12-05 MED ORDER — METRONIDAZOLE 500 MG PO TABS: 500.0000 mg | ORAL_TABLET | Freq: Three times a day (TID) | ORAL | 0 refills | Status: AC

## 2015-12-05 NOTE — Discharge Summary (Signed)
Tracey [name], is a 82 y.o. female  DOB Sep 05, 1930  MRN [id number].  Admission date:  12/03/2015  Admitting Physician  Constance Whittle Mariea Clonts, MD  Discharge Date:  12/05/2015   Primary MD  Darrow Bussing, MD  Recommendations for primary care physician for things to follow:   Admission Diagnosis  LOC   Discharge Diagnosis  LOC    Principal Problem:   Colitis, infectious Active Problems:   Colitis   Protein-calorie malnutrition, severe      Past Medical History:  Diagnosis Date  . Abnormality of gait 10/17/2012  . Alzheimer disease   . Anemia   . Arthritis    "knees" (12/12/2014)  . Dyslipidemia   . Hemiparesis and alteration of sensations as late effects of stroke (HCC) 06/24/2014  . Hypertension   . Lumbosacral spondylosis   . Memory deficit 10/17/2012  . Spinal stenosis, lumbar   . Stroke (HCC) 2000's   mild left hemiparesis  . Urinary incontinence     Past Surgical History:  Procedure Laterality Date  . CATARACT EXTRACTION W/ INTRAOCULAR LENS  IMPLANT, BILATERAL Bilateral   . EP IMPLANTABLE DEVICE N/A 09/08/2014   Procedure: Loop Recorder Insertion;  Surgeon: Marinus Maw, MD;  Location: [facility];  Service: Cardiovascular;  Laterality: N/A;  . TUBAL LIGATION Bilateral        HPI  from the history and physical done on the day of admission:     Tracey [name]  is a 82 y.o. female, with h/o memory deficits, Afib and prior CVA presents from ALF with c/o Nausea, abd pain and emesis.  +chills , no documented fevers, Pt is a poor historian. States emesis was w/o bile or blood, can't recall last BM. No cp or sob. Abd pain is worse today, mostly in lower abd, no CVA tenderness and no dysuria  In ED BP was low, improved with IVF, no fever/Leukocytosis,       Hospital Course:     1)Acute Colitis- Discussed with Dr. Nickie Retort on 12/03/15, Unable to use quinolones due to concerns about QT  prolongation, treat empirically with Bactrim DS 1 twice a day for another 10 days  and Flagyl 500mg   3 times a day also for another 10 days. Marland Kitchen stool culture and  stool for C. Difficile not done as patient did not have any loose stools. Tolerating solid diet well. No fevers no leukocytosis  2)H/o CVA/Paroxysmal Atrialfibrillation-stable, continue Xarelto   3)FEN-  creatinine trended back down with IV fluids, continue to hold lisinopril while on Bactrim to avoid AKI and Hyperkalemia   Discharge Condition: Much improved  Follow UP=-PCP   Diet and Activity recommendation:  As advised  Discharge Instructions     Discharge Instructions    Call MD for:  persistant nausea and vomiting    Complete by:  As directed   Call MD for:  temperature >100.4    Complete by:  As directed   Diet - low sodium heart healthy    Complete by:  As directed  Discharge instructions    Complete by:  As directed   Take Flagyl 500 mg 3 times a day for 10 days and Bactrim double strength 1 twice a day for 10 days, avoid ace inhibitors including lisinopril while on Bactrim.   Increase activity slowly    Complete by:  As directed        Discharge Medications       Medication List    STOP taking these medications   loratadine 10 MG tablet Commonly known as:  CLARITIN     TAKE these medications   acetaminophen 500 MG tablet Commonly known as:  TYLENOL Take 500 mg by mouth 2 (two) times daily. May also give q6hprn   acetaminophen-codeine 300-15 MG tablet Commonly known as:  TYLENOL #2 Take 1 tablet by mouth daily.   clobetasol 0.05 % external solution Commonly known as:  TEMOVATE Apply 1 application topically daily. Use mon, tue, wed, thur, fri for itching   DULoxetine 30 MG capsule Commonly known as:  CYMBALTA Take 30 mg by mouth 2 (two) times daily.   feeding supplement Liqd Take 1 Container by mouth 2 (two) times daily between meals.   Fish Oil 1000 MG Caps Take 1,000 mg by mouth  daily.   hydrocortisone cream 1 % Apply 1 application topically See admin instructions. Use mon, tue, wed, thu, fri   lamoTRIgine 25 MG tablet Commonly known as:  LAMICTAL Take 25 mg by mouth 2 (two) times daily.   lidocaine 5 % Commonly known as:  LIDODERM Place 1 patch onto the skin daily. Remove & Discard patch within 12 hours or as directed by MD   Melatonin 3 MG Caps Take 3 mg by mouth at bedtime.   metroNIDAZOLE 500 MG tablet Commonly known as:  FLAGYL Take 1 tablet (500 mg total) by mouth 3 (three) times daily.   omeprazole 20 MG capsule Commonly known as:  PRILOSEC Take 20 mg by mouth daily.   PENNSAID 2 % Soln Generic drug:  Diclofenac Sodium Place 2 application onto the skin 2 (two) times daily.   polyethylene glycol packet Commonly known as:  MIRALAX / GLYCOLAX Take 17 g by mouth every other day.   promethazine 25 MG tablet Commonly known as:  PHENERGAN Take 25 mg by mouth every 8 (eight) hours as needed for nausea or vomiting.   Rivaroxaban 15 MG Tabs tablet Commonly known as:  XARELTO Take 1 tablet (15 mg total) by mouth daily.   sulfamethoxazole-trimethoprim 800-160 MG tablet Commonly known as:  BACTRIM DS,SEPTRA DS Take 1 tablet by mouth 2 (two) times daily.       Major procedures and Radiology Reports - PLEASE review detailed and final reports for all details, in brief -   Dg Chest 2 View  Result Date: 12/03/2015 CLINICAL DATA:  Low O2 sats, very weak today. EXAM: CHEST  2 VIEW COMPARISON:  Chest x-rays dated 11/21/2015 and 08/13/2015. FINDINGS: Heart size is upper normal, stable. Mediastinal contours remain normal. Overlying loop recorder again noted Lungs are hyperexpanded. No evidence of pneumonia. No pleural effusion or pneumothorax seen. Mild degenerative change noted within the slightly kyphotic and scoliotic thoracic spine. No acute or suspicious osseous finding. IMPRESSION: No active cardiopulmonary disease. Hyperexpanded lungs suggesting  COPD. Electronically Signed   By: Bary RichardStan  Maynard M.D.   On: 12/03/2015 13:12   Dg Chest 2 View  Result Date: 11/21/2015 CLINICAL DATA:  82 year old female with altered mental status and possible sepsis EXAM: CHEST  2 VIEW COMPARISON:  Chest radiograph dated 08/13/2015 FINDINGS: Two views of the chest demonstrate emphysematous changes of the lungs. There is no focal consolidation, pleural effusion, or pneumothorax. Top-normal cardiac silhouette. Cardiac monitor device is noted. There is osteopenia with multilevel compression fracture of the thoracic spine, age indeterminate, likely old and similar to prior study. No definite acute fracture. IMPRESSION: No active cardiopulmonary disease. Electronically Signed   By: Elgie Collard M.D.   On: 11/21/2015 21:15   Ct Head Wo Contrast  Result Date: 11/21/2015 CLINICAL DATA:  Increasing confusion over the past few days. Altered mental status. EXAM: CT HEAD WITHOUT CONTRAST CT CERVICAL SPINE WITHOUT CONTRAST TECHNIQUE: Multidetector CT imaging of the head and cervical spine was performed following the standard protocol without intravenous contrast. Multiplanar CT image reconstructions of the cervical spine were also generated. COMPARISON:  Head CT 08/13/2015. Head and cervical spine CT 01/03/2015 FINDINGS: CT HEAD FINDINGS No intracranial hemorrhage, mass effect, or midline shift. Stable generalized atrophy. Stable advanced chronic small vessel ischemic change. Remote prior infarcts in the bifrontal lobes. No hydrocephalus. The basilar cisterns are patent. No evidence of territorial infarct. No intracranial fluid collection. Atherosclerosis of skullbase vasculature. Calvarium is intact. Improved paranasal sinus mucosal thickening from prior. Mastoid air cells are well aerated. CT CERVICAL SPINE FINDINGS Flattening of C5 and C6 vertebral bodies, unchanged. Severe C5-C6 disc space loss and endplate spurring, unchanged. Progressive degenerative disc disease at C6-C7 with  vacuum phenomenon. No acute fracture or subluxation. Bony ankylosis of C2-3 C4 facets on the right are unchanged. The dens is intact without fracture. There are no jumped or perched facets. Multilevel neural foraminal stenosis. No prevertebral soft tissue edema. Biapical pleural parenchymal scarring, right greater than left. IMPRESSION: 1. No acute intracranial abnormality. Advanced chronic small vessel ischemia, atrophy, and remote prior infarcts unchanged. 2. Multilevel degenerative change in the cervical spine, with mild progression of degenerative disc disease at C6-C7 from prior. No acute bony abnormality. Electronically Signed   By: Rubye Oaks M.D.   On: 11/21/2015 22:35   Ct Cervical Spine Wo Contrast  Result Date: 11/21/2015 CLINICAL DATA:  Increasing confusion over the past few days. Altered mental status. EXAM: CT HEAD WITHOUT CONTRAST CT CERVICAL SPINE WITHOUT CONTRAST TECHNIQUE: Multidetector CT imaging of the head and cervical spine was performed following the standard protocol without intravenous contrast. Multiplanar CT image reconstructions of the cervical spine were also generated. COMPARISON:  Head CT 08/13/2015. Head and cervical spine CT 01/03/2015 FINDINGS: CT HEAD FINDINGS No intracranial hemorrhage, mass effect, or midline shift. Stable generalized atrophy. Stable advanced chronic small vessel ischemic change. Remote prior infarcts in the bifrontal lobes. No hydrocephalus. The basilar cisterns are patent. No evidence of territorial infarct. No intracranial fluid collection. Atherosclerosis of skullbase vasculature. Calvarium is intact. Improved paranasal sinus mucosal thickening from prior. Mastoid air cells are well aerated. CT CERVICAL SPINE FINDINGS Flattening of C5 and C6 vertebral bodies, unchanged. Severe C5-C6 disc space loss and endplate spurring, unchanged. Progressive degenerative disc disease at C6-C7 with vacuum phenomenon. No acute fracture or subluxation. Bony ankylosis  of C2-3 C4 facets on the right are unchanged. The dens is intact without fracture. There are no jumped or perched facets. Multilevel neural foraminal stenosis. No prevertebral soft tissue edema. Biapical pleural parenchymal scarring, right greater than left. IMPRESSION: 1. No acute intracranial abnormality. Advanced chronic small vessel ischemia, atrophy, and remote prior infarcts unchanged. 2. Multilevel degenerative change in the cervical spine, with mild progression of degenerative disc disease at C6-C7 from prior. No  acute bony abnormality. Electronically Signed   By: Rubye Oaks M.D.   On: 11/21/2015 22:35   Ct Abdomen Pelvis W Contrast  Result Date: 12/03/2015 CLINICAL DATA:  Nausea, vomiting and diarrhea for several days. EXAM: CT ABDOMEN AND PELVIS WITH CONTRAST TECHNIQUE: Multidetector CT imaging of the abdomen and pelvis was performed using the standard protocol following bolus administration of intravenous contrast. CONTRAST:  80mL ISOVUE-300 IOPAMIDOL (ISOVUE-300) INJECTION 61% COMPARISON:  None. FINDINGS: Lower chest:  Mild atelectasis/ scarring at each lung base. Hepatobiliary: Liver appears normal. Gallbladder is mildly distended but otherwise unremarkable. No inflammation to suggest acute cholecystitis. No bile duct dilatation seen. Pancreas: Diminutive and partially infiltrated with fat but otherwise unremarkable. No evidence of acute pancreatitis. Spleen: Within normal limits in size and appearance. Adrenals/Urinary Tract: Adrenal glands appear normal. Kidneys are unremarkable without mass, stone or hydronephrosis. No ureteral or bladder calculi identified. Bladder appears normal. Stomach/Bowel: Probable thickening of the walls of the rectosigmoid colon, with additional milder thickening and inflammation of the walls of the transverse and descending colon, suggesting diffuse colitis of infectious or inflammatory nature. Stomach and small bowel appear normal. Appendix is not well seen in  its entirety but appears grossly normal and there are no inflammatory changes about the cecum to suggest acute appendicitis. Vascular/Lymphatic: Scattered atherosclerotic changes of the normal caliber abdominal aorta and pelvic vasculature. No enlarged lymph nodes seen in the abdomen or pelvis. Reproductive: No mass or other significant abnormality. Other: Trace free fluid in the lower pelvis. No other free fluid seen. No abscess collections seen. No free intraperitoneal air. Musculoskeletal: Degenerative changes throughout the scoliotic thoracolumbar spine. No acute or suspicious osseous finding. Superficial soft tissues are unremarkable. IMPRESSION: 1. Thickening of the walls of the rectosigmoid colon, with probable milder thickening of the walls of the transverse and descending colon, suggesting diffuse colitis of infectious or inflammatory nature. No bowel obstruction. Small amount of free fluid in the lower pelvis. No abscess collection or free intraperitoneal air. 2. Remainder of the abdomen and pelvis CT is unremarkable. No evidence of acute solid organ abnormality. No renal or ureteral calculi. 3. Aortic atherosclerosis. Electronically Signed   By: Bary Richard M.D.   On: 12/03/2015 13:46   Dg Shoulder Left  Result Date: 11/21/2015 CLINICAL DATA:  Left shoulder pain. Patient's daughter reports pain for months, history of fluid removed from left shoulder. EXAM: LEFT SHOULDER - 2+ VIEW COMPARISON:  No prior shoulder imaging. FINDINGS: There is no evidence of fracture or dislocation. Mild proliferative change at the acromioclavicular joint. No bony destructive change. Soft tissues are unremarkable. IMPRESSION: Mild proliferative change at the acromioclavicular joint. No acute osseous abnormality. Electronically Signed   By: Rubye Oaks M.D.   On: 11/21/2015 22:11    Micro Results   Recent Results (from the past 240 hour(s))  Gastrointestinal Panel by PCR , Stool     Status: None   Collection  Time: 12/03/15 12:35 AM  Result Value Ref Range Status   Campylobacter species NOT DETECTED NOT DETECTED Final   Plesimonas shigelloides NOT DETECTED NOT DETECTED Final   Salmonella species NOT DETECTED NOT DETECTED Final   Yersinia enterocolitica NOT DETECTED NOT DETECTED Final   Vibrio species NOT DETECTED NOT DETECTED Final   Vibrio cholerae NOT DETECTED NOT DETECTED Final   Enteroaggregative E coli (EAEC) NOT DETECTED NOT DETECTED Final   Enteropathogenic E coli (EPEC) NOT DETECTED NOT DETECTED Final   Enterotoxigenic E coli (ETEC) NOT DETECTED NOT DETECTED Final  Shiga like toxin producing E coli (STEC) NOT DETECTED NOT DETECTED Final   E. coli O157 NOT DETECTED NOT DETECTED Final   Shigella/Enteroinvasive E coli (EIEC) NOT DETECTED NOT DETECTED Final   Cryptosporidium NOT DETECTED NOT DETECTED Final   Cyclospora cayetanensis NOT DETECTED NOT DETECTED Final   Entamoeba histolytica NOT DETECTED NOT DETECTED Final   Giardia lamblia NOT DETECTED NOT DETECTED Final   Adenovirus F40/41 NOT DETECTED NOT DETECTED Final   Astrovirus NOT DETECTED NOT DETECTED Final   Norovirus GI/GII NOT DETECTED NOT DETECTED Final   Rotavirus A NOT DETECTED NOT DETECTED Final   Sapovirus (I, II, IV, and V) NOT DETECTED NOT DETECTED Final  Culture, blood (Routine X 2) w Reflex to ID Panel     Status: None (Preliminary result)   Collection Time: 12/03/15  4:15 PM  Result Value Ref Range Status   Specimen Description BLOOD LEFT ARM  Final   Special Requests IN PEDIATRIC BOTTLE 1CC  Final   Culture NO GROWTH < 24 HOURS  Final   Report Status PENDING  Incomplete  Culture, blood (Routine X 2) w Reflex to ID Panel     Status: None (Preliminary result)   Collection Time: 12/03/15  4:20 PM  Result Value Ref Range Status   Specimen Description BLOOD RIGHT HAND  Final   Special Requests BOTTLES DRAWN AEROBIC ONLY 5CC  Final   Culture NO GROWTH < 24 HOURS  Final   Report Status PENDING  Incomplete  C difficile  quick scan w PCR reflex     Status: Abnormal   Collection Time: 12/04/15  7:38 AM  Result Value Ref Range Status   C Diff antigen POSITIVE (A) NEGATIVE Final   C Diff toxin NEGATIVE NEGATIVE Final   C Diff interpretation Results are indeterminate. See PCR results.  Final  Clostridium Difficile by PCR     Status: None   Collection Time: 12/04/15  7:38 AM  Result Value Ref Range Status   Toxigenic C Difficile by pcr NEGATIVE NEGATIVE Final  MRSA PCR Screening     Status: None   Collection Time: 12/04/15  5:03 PM  Result Value Ref Range Status   MRSA by PCR NEGATIVE NEGATIVE Final    Comment:        The GeneXpert MRSA Assay (FDA approved for NASAL specimens only), is one component of a comprehensive MRSA colonization surveillance program. It is not intended to diagnose MRSA infection nor to guide or monitor treatment for MRSA infections.        Today   Subjective    Tracey [name] today has no New complaints, remains intermittently confused and disoriented, eating and drinking well, no diarrhea, no fevers no emesis, denies abdominal pain          Patient has been seen and examined prior to discharge   Objective   Blood pressure 122/83, pulse 99, temperature 97.9 F (36.6 C), temperature source Oral, resp. rate 18, height 5' (1.524 m), weight 55.3 kg (122 lb), SpO2 99 %.   Intake/Output Summary (Last 24 hours) at 12/05/15 1121 Last data filed at 12/05/15 0947  Gross per 24 hour  Intake             1560 ml  Output                0 ml  Net             1560 ml    Exam Gen:- Awake  In no  apparent distress  HEENT:- [place].AT,   Neck-Supple Neck,No JVD,  Lungs- mostly clear  CV- S1, S2 normal Abd-  +ve B.Sounds, Abd Soft, No tenderness,  benign abdominal exam Extremity/Skin:- Intact peripheral pulses NeuroPsych- cognitive deficits, disoriented from time to time, no focal neuro deficits     Data Review   CBC w Diff: Lab Results  Component Value Date   WBC 7.6 12/04/2015    HGB 10.4 (L) 12/04/2015   HCT 32.6 (L) 12/04/2015   PLT 202 12/04/2015   LYMPHOPCT 16 11/21/2015   MONOPCT 10 11/21/2015   EOSPCT 2 11/21/2015   BASOPCT 1 11/21/2015    CMP: Lab Results  Component Value Date   NA 136 12/05/2015   K 4.2 12/05/2015   CL 105 12/05/2015   CO2 24 12/05/2015   BUN 7 12/05/2015   CREATININE 1.08 (H) 12/05/2015   PROT 5.8 (L) 12/03/2015   ALBUMIN 3.8 12/03/2015   BILITOT 1.1 12/03/2015   ALKPHOS 49 12/03/2015   AST 25 12/03/2015   ALT 13 (L) 12/03/2015  .   Total Discharge time is about 33 minutes  Shavar Gorka M.D on 12/05/2015 at 11:21 AM  [facility]   Office  [phone]  Dragon dictation system was used to create this note, attempts have been made to correct errors, however presence of uncorrected errors is not a reflection quality of care provided

## 2015-12-05 NOTE — Discharge Summary (Addendum)
Please see Full discharge summary dated 12/05/2015 which was erroneously labeled as a progress note for same date of service.

## 2015-12-05 NOTE — Progress Notes (Signed)
[facility]  Called and notified of pt dishcharge. Barbera Settersurner, Ellery Meroney B RN

## 2015-12-05 NOTE — Clinical Social Work Note (Signed)
CSW notified by RN that pt medically cleared by MD for discharge. [facility] and informed [facility] pt discharging and will arrive via [facility]. CSW contacted [facility] to transport pt back to SNF. Pt family contacted and CSW requested nurse call and give report to SNF.

## 2015-12-08 LAB — CULTURE, BLOOD (ROUTINE X 2)
CULTURE: NO GROWTH
Culture: NO GROWTH

## 2015-12-29 LAB — CUP PACEART REMOTE DEVICE CHECK: Date Time Interrogation Session: 20170816233614

## 2016-01-01 ENCOUNTER — Ambulatory Visit (INDEPENDENT_AMBULATORY_CARE_PROVIDER_SITE_OTHER): Admitting: *Deleted

## 2016-01-01 DIAGNOSIS — I639 Cerebral infarction, unspecified: Secondary | ICD-10-CM

## 2016-01-04 NOTE — Progress Notes (Signed)
Carelink Summary Report / Loop Recorder 

## 2016-01-07 ENCOUNTER — Ambulatory Visit: Payer: Medicare Other | Admitting: Neurology

## 2016-01-23 LAB — CUP PACEART REMOTE DEVICE CHECK: MDC IDC SESS DTM: 20170916000538

## 2016-01-23 NOTE — Progress Notes (Signed)
[facility] summary report received. Battery status OK. Normal device function. No new symptom episodes, brady, or pause episodes. 99% AF, +Xarelto, V rates elevated, hx of bradycardia. Monthly summary reports and ROV/PRN

## 2016-02-01 ENCOUNTER — Encounter: Payer: Medicare Other | Admitting: *Deleted

## 2016-05-25 ENCOUNTER — Other Ambulatory Visit: Payer: Self-pay | Admitting: Internal Medicine

## 2017-04-14 IMAGING — DX DG RIBS W/ CHEST 3+V BILAT
7 series · 7 of 7 positions shown · non-contrast
Comparison: None.

CLINICAL DATA: Status post fall from bed with left-sided back and
thoracic spine pain.

EXAM:
BILATERAL RIBS AND CHEST - 4+ VIEW

[chest pa]
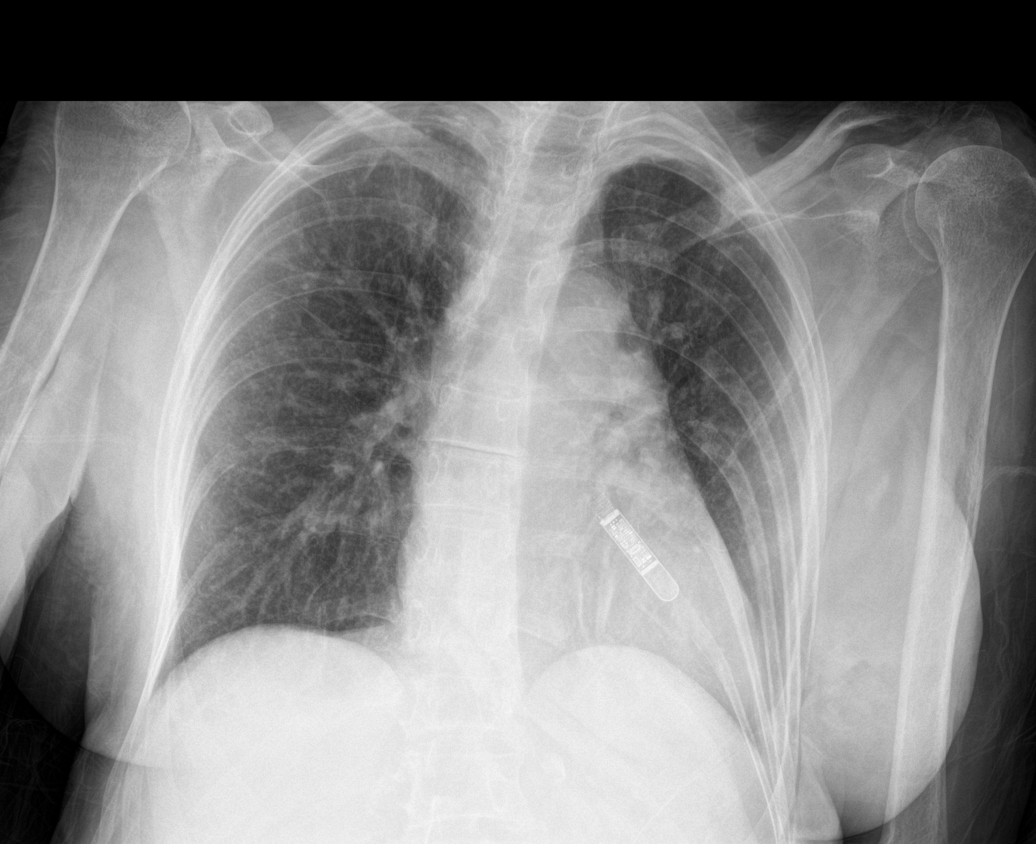

[rib pa obl (1 of 2)]
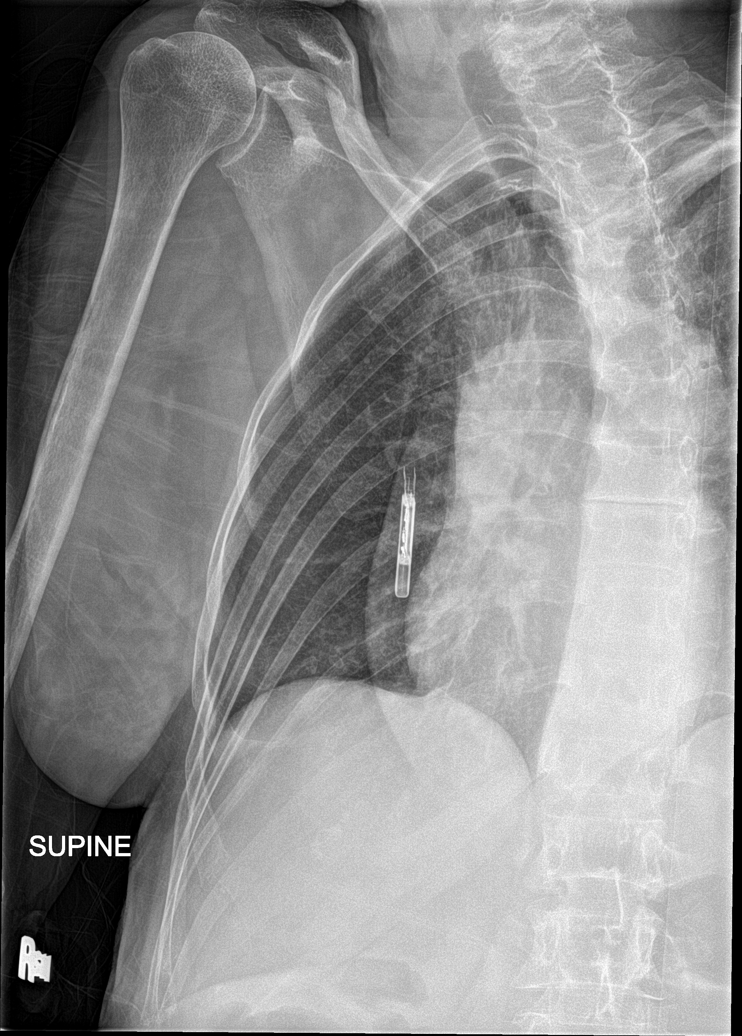

[rib pa obl (2 of 2)]
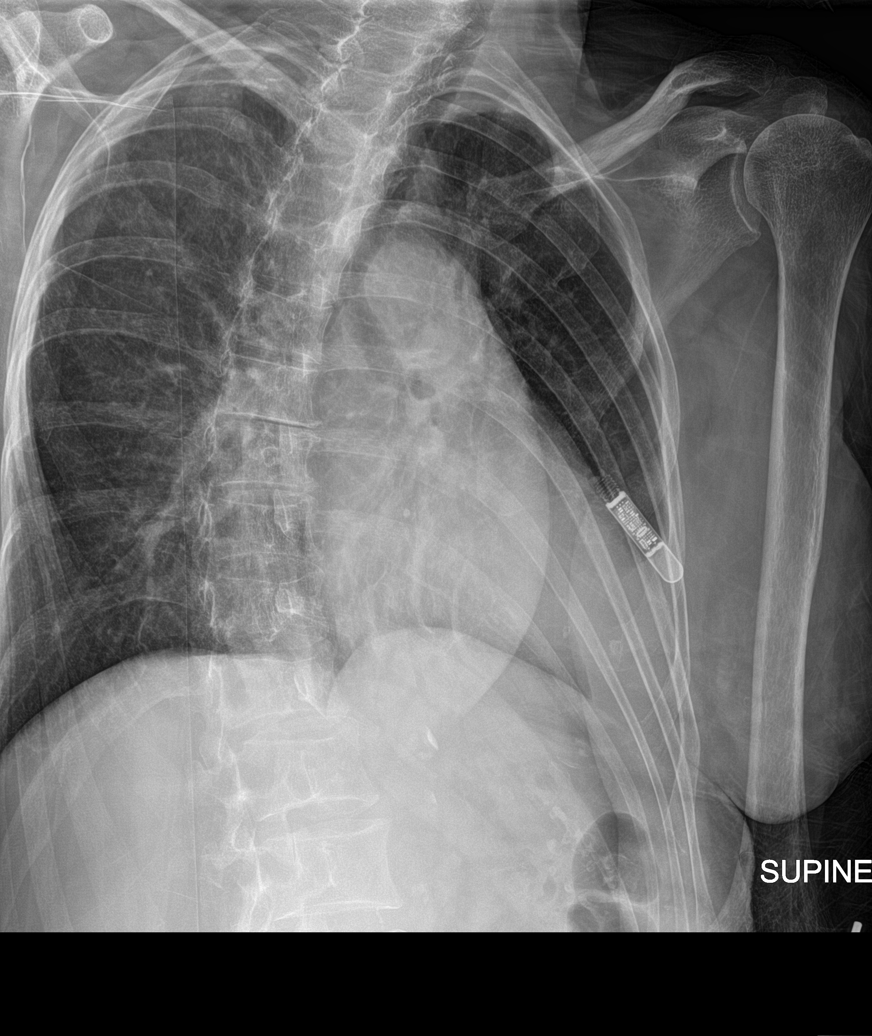

[rib ap (1 of 4)]
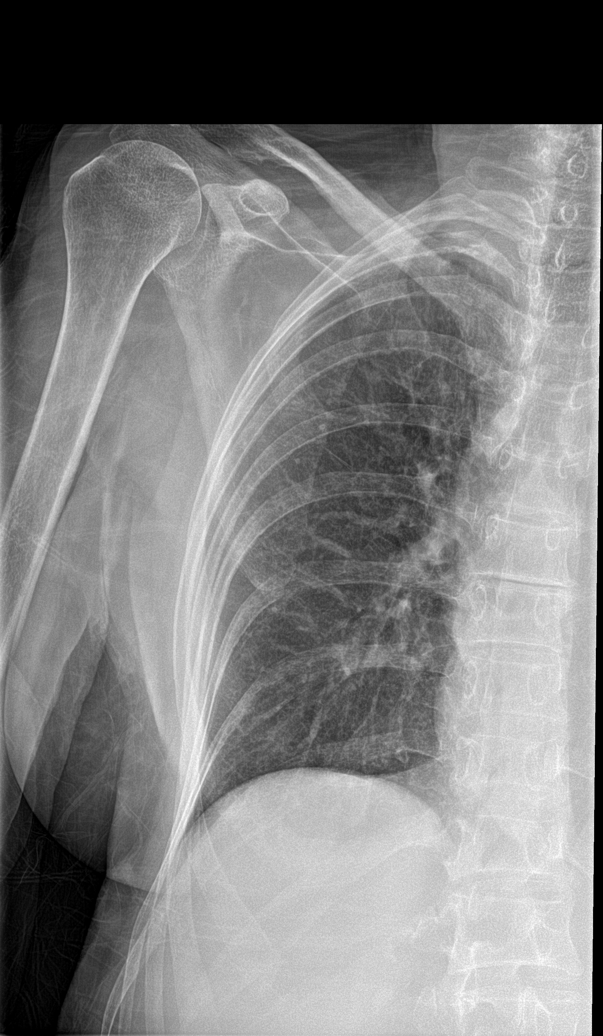

[rib ap (2 of 4)]
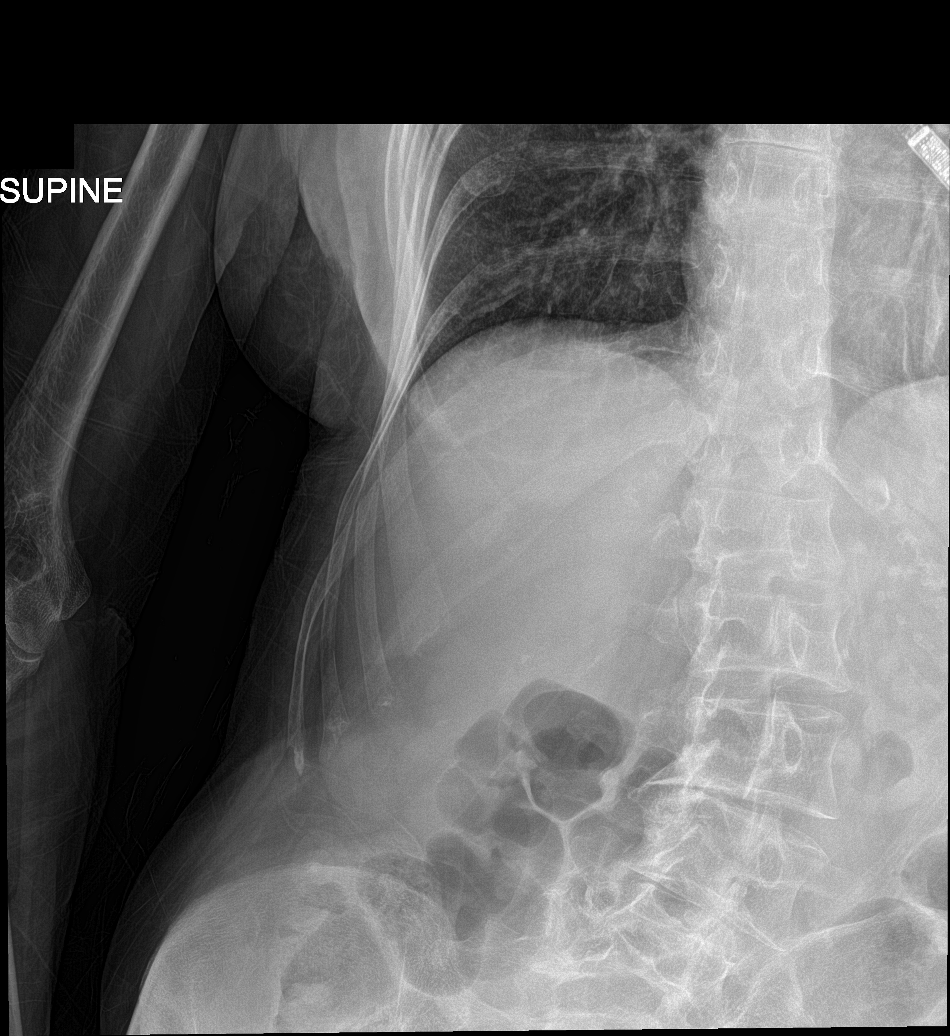

[rib ap (3 of 4)]
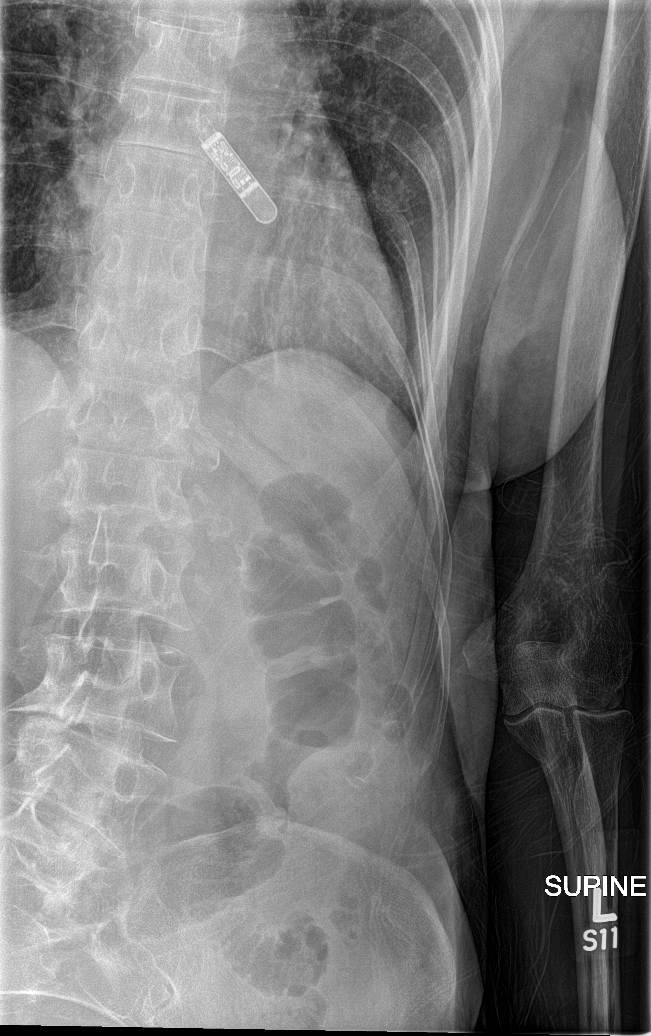

[rib ap (4 of 4)]
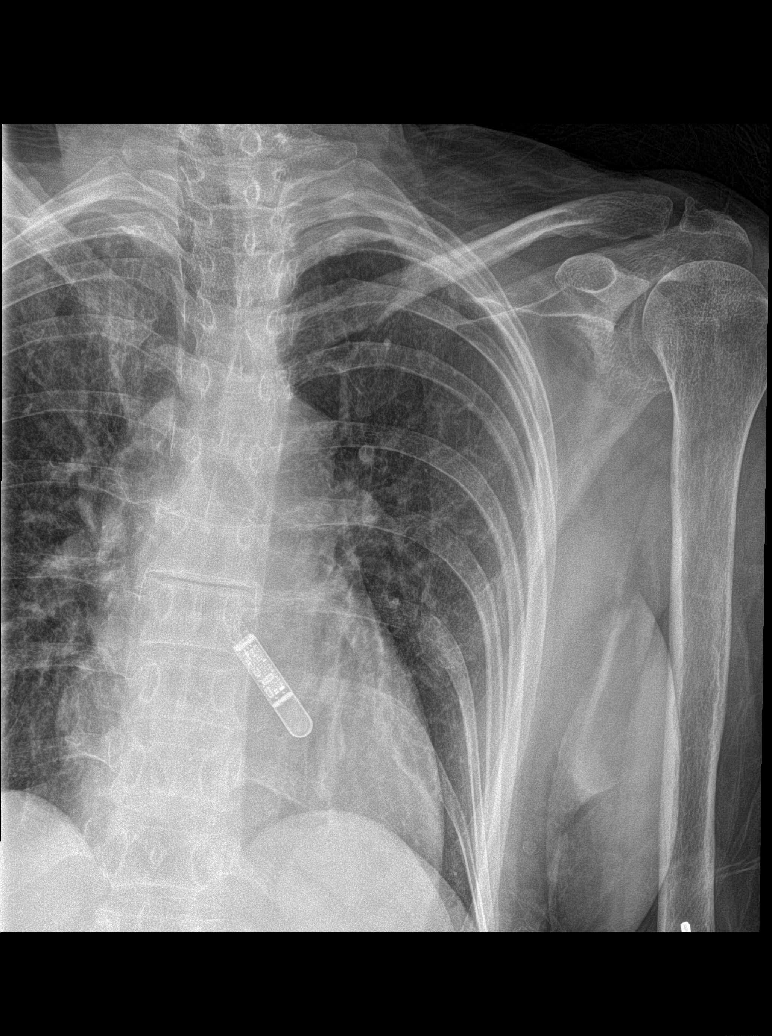

[7 of 7 positions shown; findings below may reference images not displayed]

FINDINGS: No fracture or other bone lesions are seen involving the ribs. There
is no evidence of pneumothorax or pleural effusion. Both lungs are
clear. Heart size and mediastinal contours are within normal limits.
An electronic apparatus overlies the left lower thorax.
IMPRESSION: No evidence of displaced rib fractures.

## 2017-04-14 IMAGING — CT CT CERVICAL SPINE W/O CM
3 of 4 series · 13 of 33 positions shown, 16 images · non-contrast
Comparison: 04/24/2014

CLINICAL DATA: Patient fell. Found at the foot upper been. Confused
with neck pain.

EXAM:
CT HEAD WITHOUT CONTRAST
CT CERVICAL SPINE WITHOUT CONTRAST
TECHNIQUE: Multidetector CT imaging of the head and cervical spine was
performed following the standard protocol without intravenous
contrast. Multiplanar CT image reconstructions of the cervical spine
were also generated.

[Series 3: c_spine 2.0 i30s 3 · axial · 0.30mm/px · z∈[-302,-206]mm · 5 of 72 slices shown, 7 images]
[im 12/72  soft-tissue]
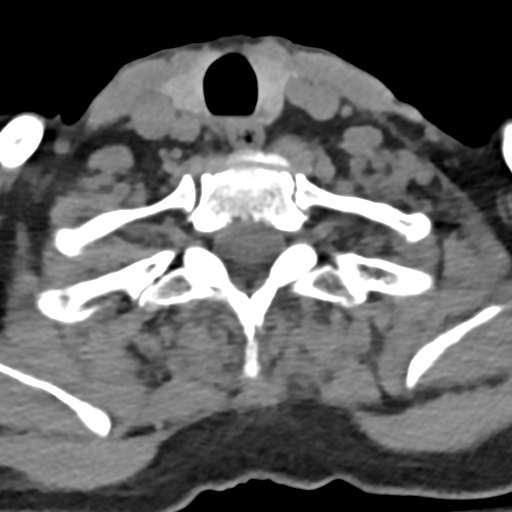
[im 12/72  bone]
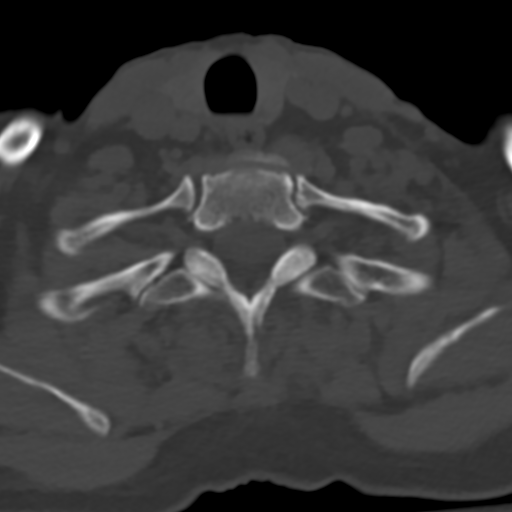
[im 24/72  bone]
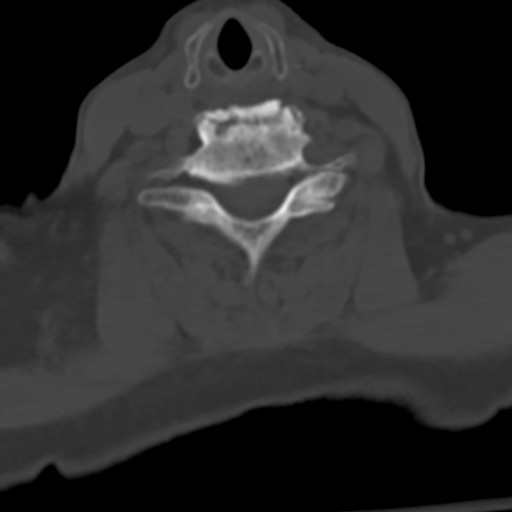
[im 36/72  bone]
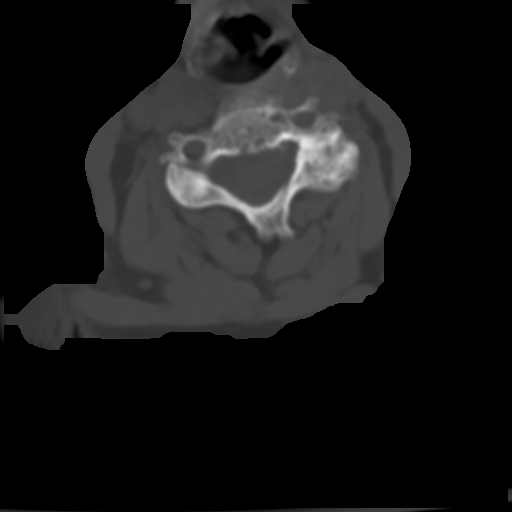
[im 48/72  bone]
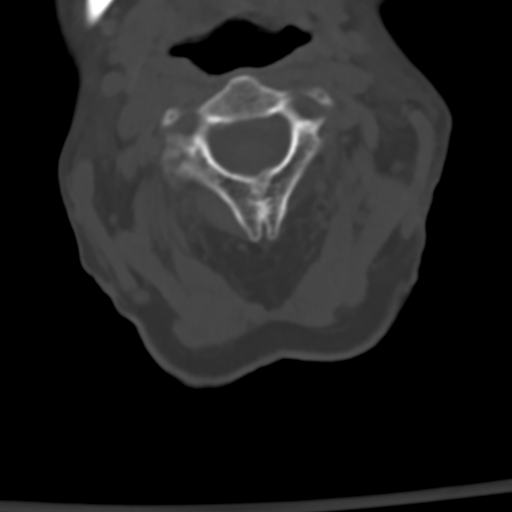
[im 60/72  soft-tissue]
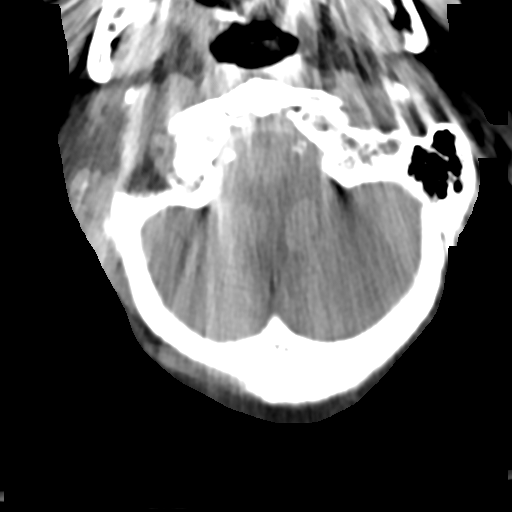
[im 60/72  bone]
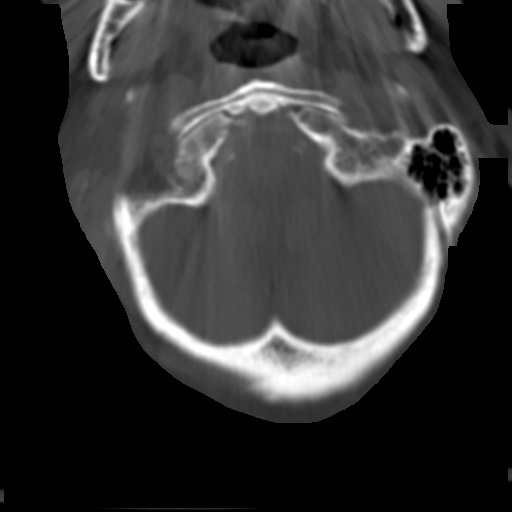

[Series 5: coronals · coronal · 0.28mm/px · 3 of 43 slices shown]
[im 9/43  bone]
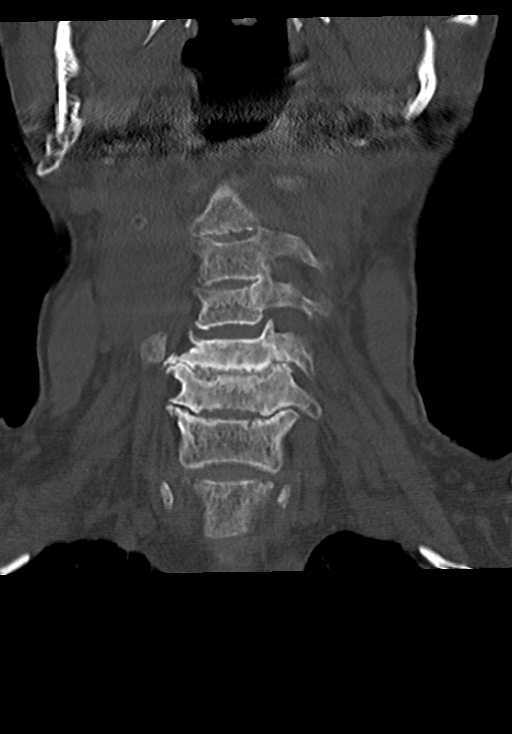
[im 17/43  bone]
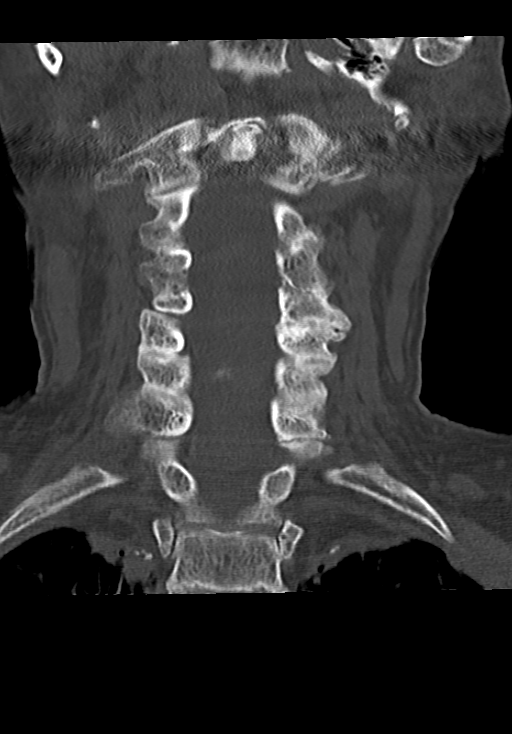
[im 26/43  bone]
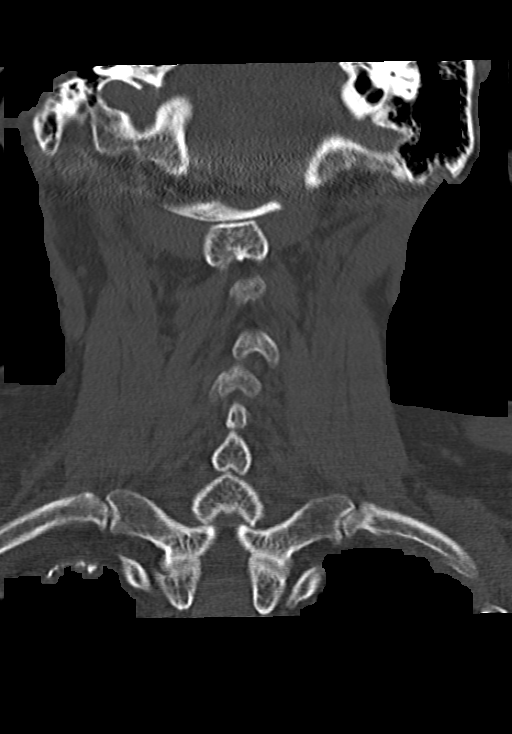

[Series 6: sagittals · sagittal · 0.29mm/px · 5 of 51 slices shown, 6 images]
[im 17/51  bone]
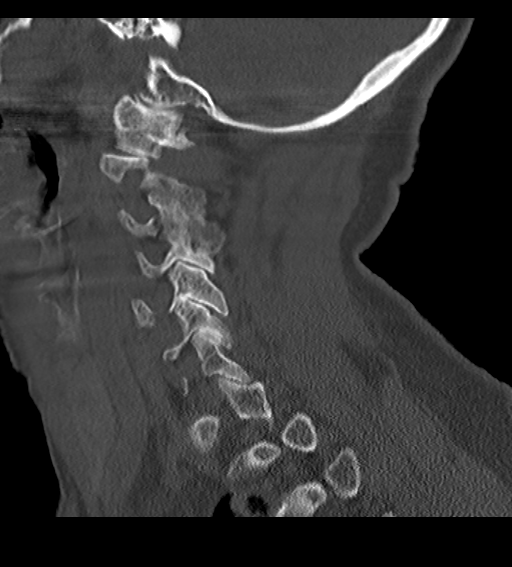
[im 21/51  bone]
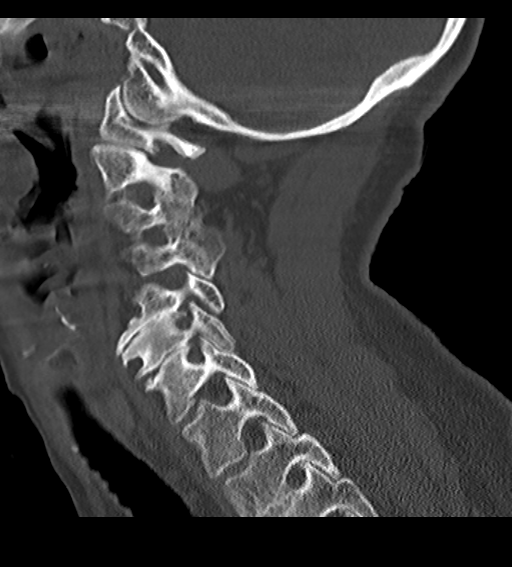
[im 26/51  soft-tissue]
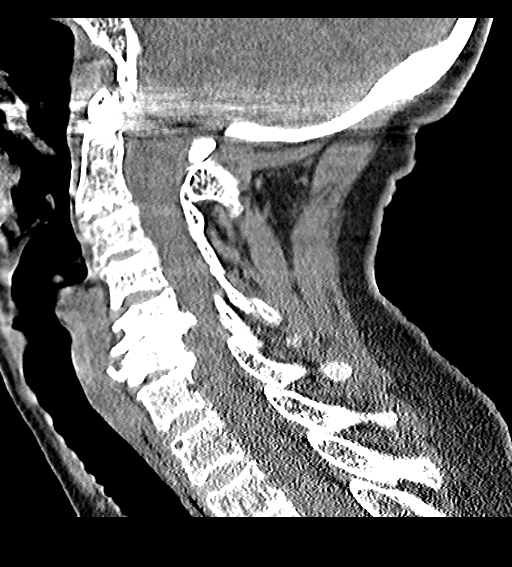
[im 26/51  bone]
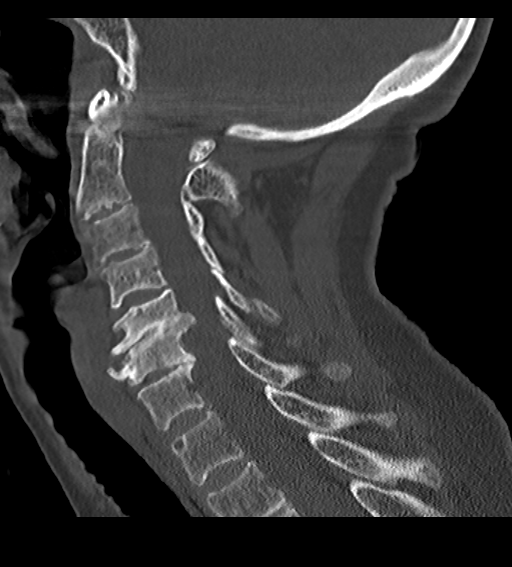
[im 30/51  bone]
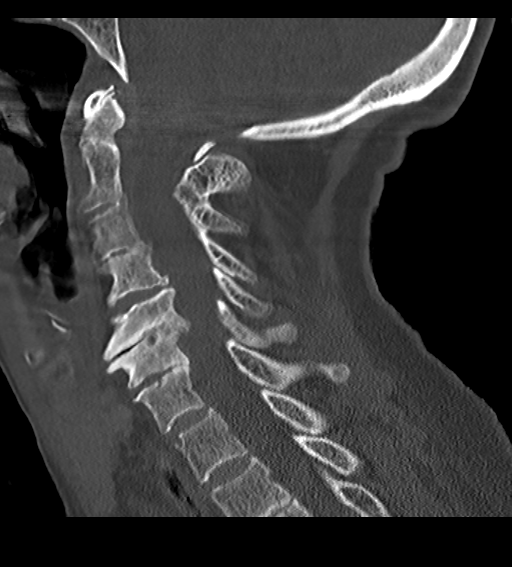
[im 34/51  bone]
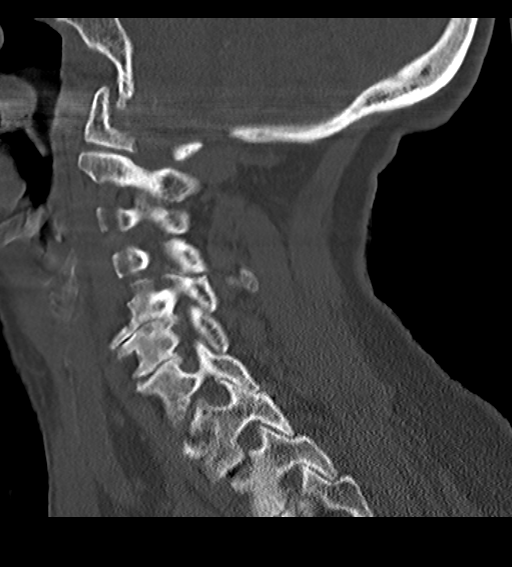

[13 of 33 positions shown; findings below may reference images not displayed]

FINDINGS: CT HEAD FINDINGS

Ventricles are enlarged, to a greater degree than the sulci, but are
stable from the prior CT. Findings are consistent with advanced
atrophy.

There are no parenchymal masses or mass effect.

There are multiple old infarcts involving both frontal lobes and the
right external capsule on the left thalamus. White matter
hypoattenuation is seen more diffusely consistent with moderate to
advanced chronic microvascular ischemic change.

There is no evidence of a recent cortical infarct.

There are no extra-axial masses or abnormal fluid collections.

There is no intracranial hemorrhage.

No skull fracture. Moderate mucosal thickening lines the ethmoid air
cells. There is mild maxillary sinus mucosal thickening. Mucous
retention cyst lies in the right sphenoid sinus. Clear mastoid air
cells.

CT CERVICAL SPINE FINDINGS

No fracture. No spondylolisthesis. There is marked loss of disc
height at C5-C6. Moderate loss disc height is noted at C6-C7. There
is mild to moderate loss of disc height at C2-C3 and C3-C4. Facet
degenerative changes noted bilaterally. There is acquired fusion of
the right facets at C2-C3 and C3-C4. Bones are diffusely
demineralized. There multiple levels of neural foraminal narrowing.
Soft tissues are unremarkable. Scarring is noted at the lung apices.
IMPRESSION: HEAD CT: No acute intracranial abnormalities. Advanced atrophy,
chronic microvascular ischemic change and multiple old infarcts.

CERVICAL CT: No fracture or acute finding. Advanced degenerative
changes.

## 2017-04-16 IMAGING — DX DG LUMBAR SPINE 2-3V
3 series · 3 of 3 positions shown · non-contrast
Comparison: August 30, 2012.

CLINICAL DATA: Lower back pain, lumbrosacral spondylosis.

EXAM:
LUMBAR SPINE - 2-3 VIEW

[t lumbar spine lat]
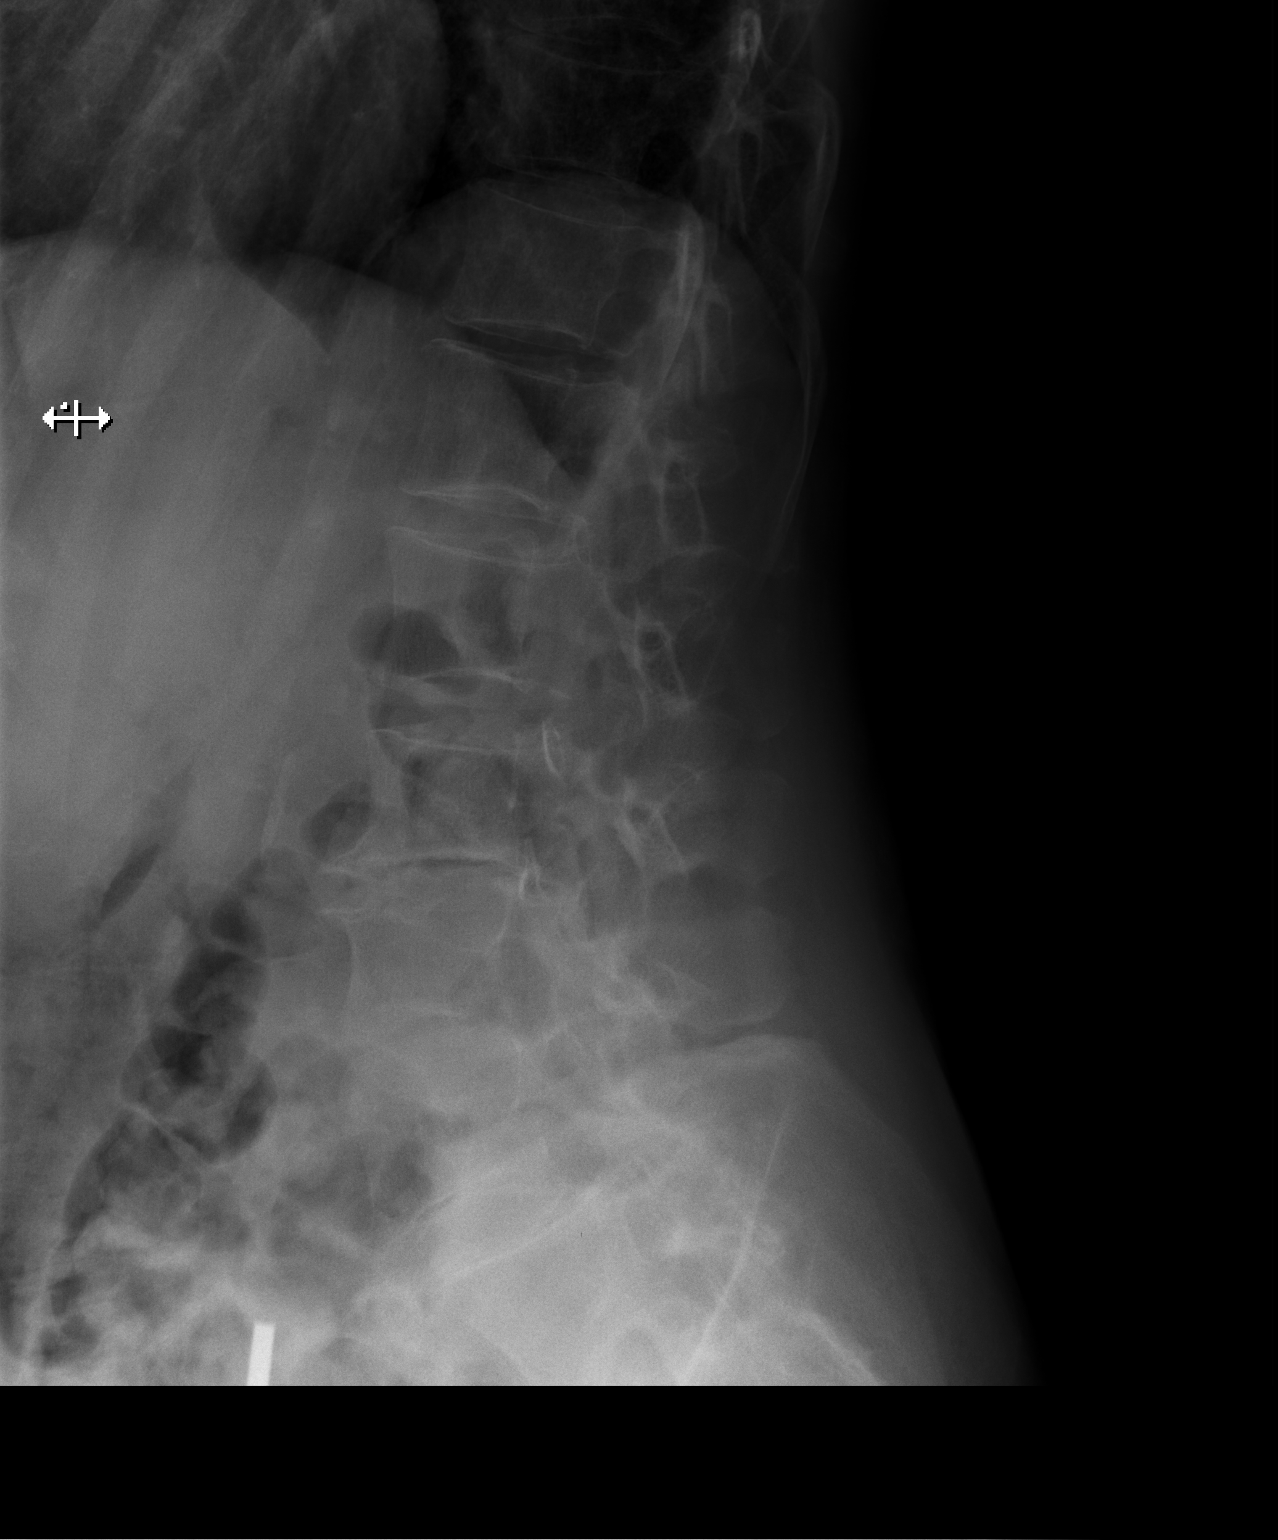

[t lumbar l-5 s-1 spot]
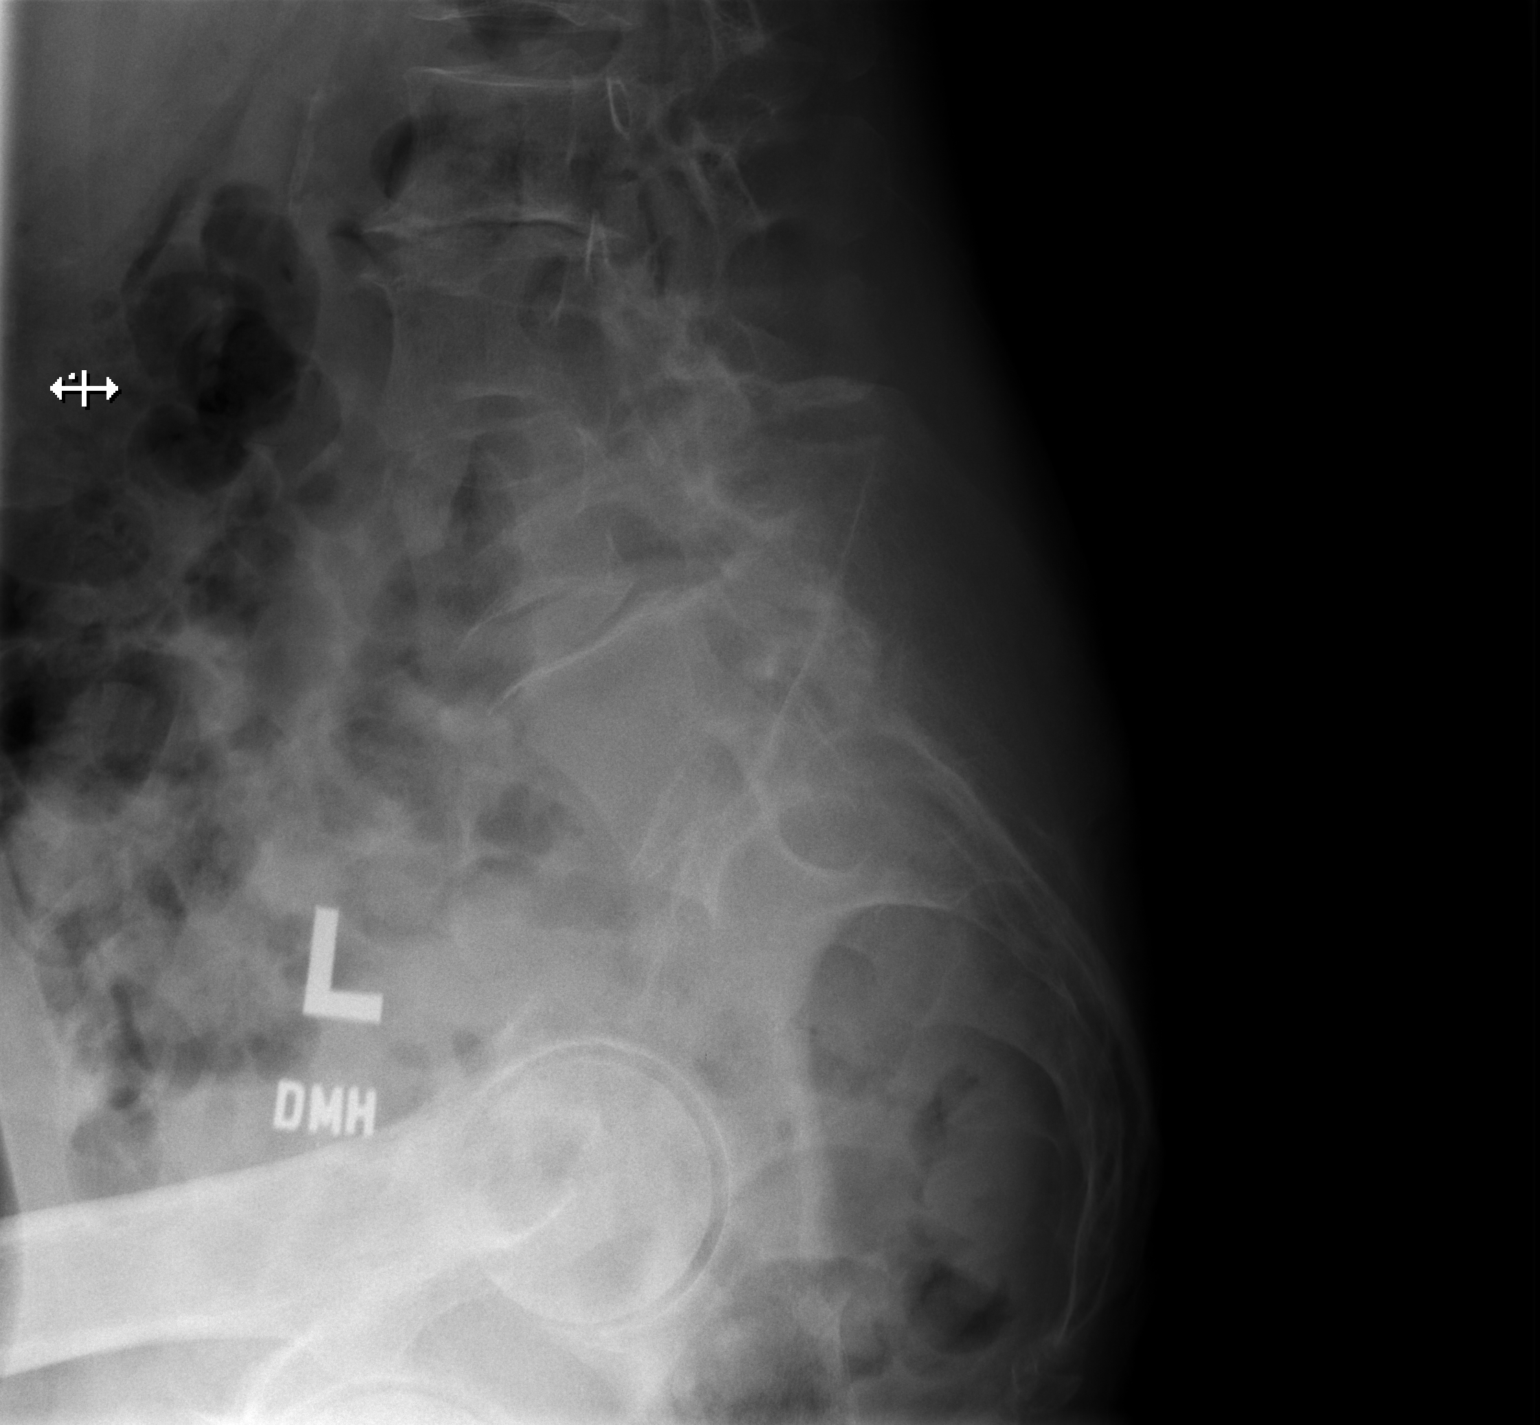

[t lumbar spine ap]
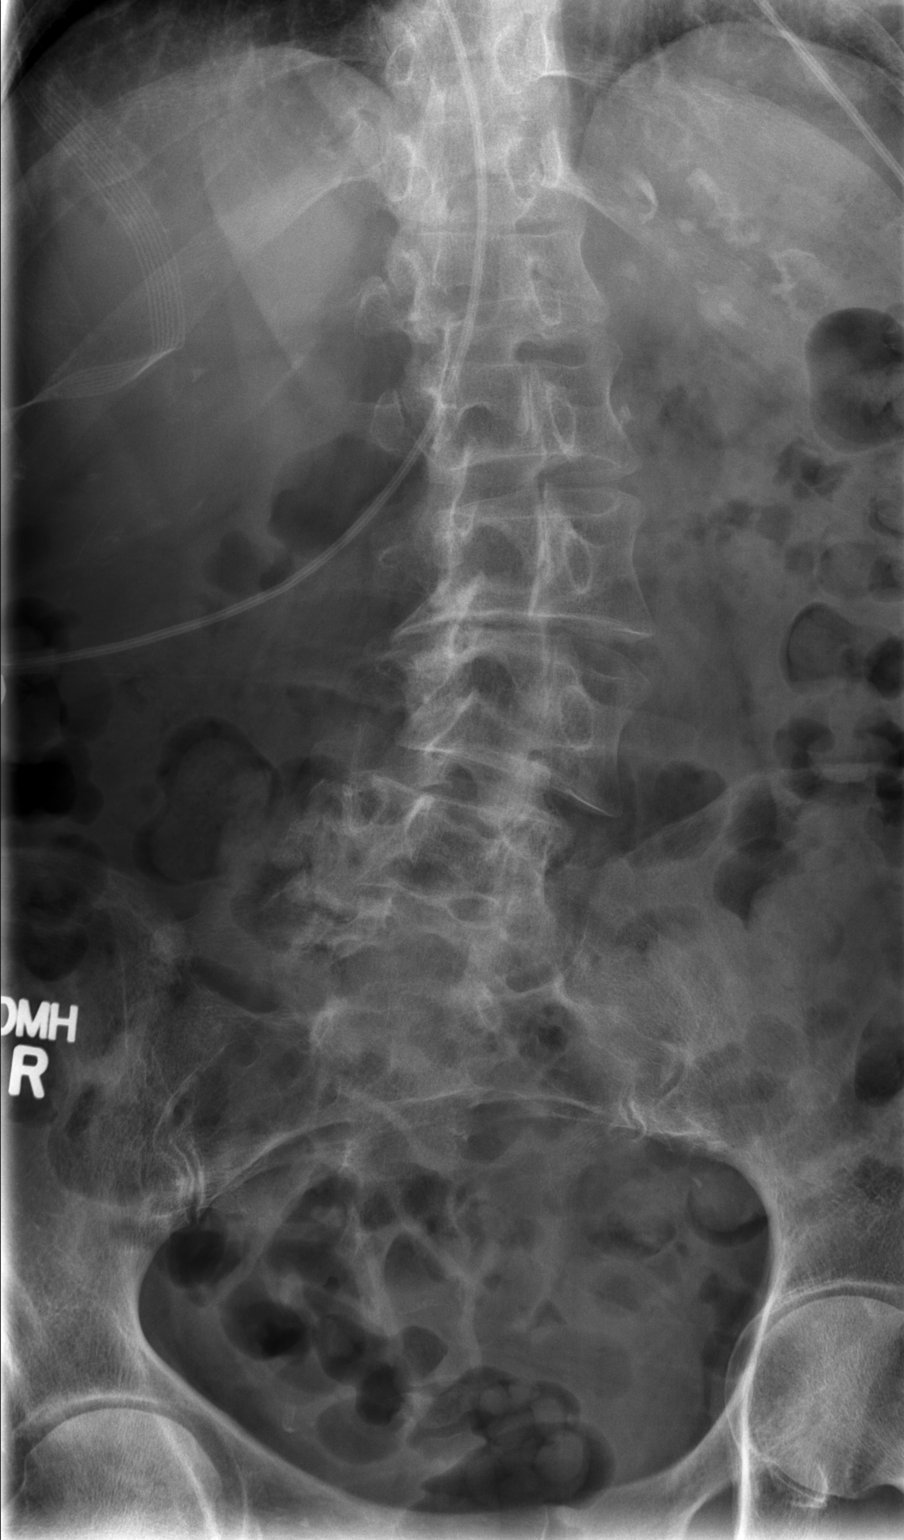

[3 of 3 positions shown; findings below may reference images not displayed]

FINDINGS: Diffuse osteopenia is noted. No fracture is noted. Severe
degenerative disc disease is noted at L2-3 which is unchanged
compared to prior exam. Stable grade 1 anterolisthesis of L4-5 is
noted. Moderate levoscoliosis of the lumbar spine is noted. Lateral
subluxation of L4 on L5 to the left is noted.
IMPRESSION: Stable chronic findings as described above. No acute abnormality
seen.
